# Patient Record
Sex: Male | Born: 1970 | Race: White | Hispanic: No | Smoking: Current every day smoker
Health system: Southern US, Community
[De-identification: ages and names within clinical notes are randomized; demographics above are authoritative.]

## PROBLEM LIST (undated history)

## (undated) DIAGNOSIS — K219 Gastro-esophageal reflux disease without esophagitis: Secondary | ICD-10-CM

## (undated) DIAGNOSIS — J449 Chronic obstructive pulmonary disease, unspecified: Secondary | ICD-10-CM

## (undated) DIAGNOSIS — I219 Acute myocardial infarction, unspecified: Secondary | ICD-10-CM

## (undated) DIAGNOSIS — F1010 Alcohol abuse, uncomplicated: Secondary | ICD-10-CM

## (undated) DIAGNOSIS — J45909 Unspecified asthma, uncomplicated: Secondary | ICD-10-CM

## (undated) DIAGNOSIS — R51 Headache: Secondary | ICD-10-CM

## (undated) DIAGNOSIS — F419 Anxiety disorder, unspecified: Secondary | ICD-10-CM

## (undated) DIAGNOSIS — E785 Hyperlipidemia, unspecified: Secondary | ICD-10-CM

## (undated) DIAGNOSIS — I10 Essential (primary) hypertension: Secondary | ICD-10-CM

## (undated) DIAGNOSIS — C4490 Unspecified malignant neoplasm of skin, unspecified: Secondary | ICD-10-CM

## (undated) DIAGNOSIS — I25810 Atherosclerosis of coronary artery bypass graft(s) without angina pectoris: Secondary | ICD-10-CM

## (undated) DIAGNOSIS — R519 Headache, unspecified: Secondary | ICD-10-CM

## (undated) DIAGNOSIS — Z720 Tobacco use: Secondary | ICD-10-CM

## (undated) HISTORY — PX: SKIN CANCER EXCISION: SHX779

## (undated) HISTORY — DX: Anxiety disorder, unspecified: F41.9

## (undated) HISTORY — DX: Unspecified asthma, uncomplicated: J45.909

## (undated) HISTORY — PX: LACERATION REPAIR: SHX5168

## (undated) HISTORY — DX: Essential (primary) hypertension: I10

## (undated) HISTORY — DX: Acute myocardial infarction, unspecified: I21.9

---

## 2005-01-29 ENCOUNTER — Emergency Department (HOSPITAL_COMMUNITY): Admission: EM | Admit: 2005-01-29 | Discharge: 2005-01-30 | Payer: Self-pay | Admitting: Emergency Medicine

## 2005-02-17 ENCOUNTER — Emergency Department (HOSPITAL_COMMUNITY): Admission: EM | Admit: 2005-02-17 | Discharge: 2005-02-17 | Payer: Self-pay | Admitting: Emergency Medicine

## 2005-03-04 ENCOUNTER — Emergency Department (HOSPITAL_COMMUNITY): Admission: EM | Admit: 2005-03-04 | Discharge: 2005-03-04 | Payer: Self-pay | Admitting: Emergency Medicine

## 2005-11-10 ENCOUNTER — Emergency Department (HOSPITAL_COMMUNITY): Admission: EM | Admit: 2005-11-10 | Discharge: 2005-11-10 | Payer: Self-pay | Admitting: Emergency Medicine

## 2017-10-18 DIAGNOSIS — R51 Headache: Secondary | ICD-10-CM

## 2017-10-18 DIAGNOSIS — R0789 Other chest pain: Secondary | ICD-10-CM

## 2017-10-18 DIAGNOSIS — R740 Nonspecific elevation of levels of transaminase and lactic acid dehydrogenase [LDH]: Secondary | ICD-10-CM

## 2017-10-18 DIAGNOSIS — R079 Chest pain, unspecified: Secondary | ICD-10-CM | POA: Diagnosis not present

## 2017-10-18 DIAGNOSIS — R109 Unspecified abdominal pain: Secondary | ICD-10-CM

## 2017-10-18 DIAGNOSIS — J449 Chronic obstructive pulmonary disease, unspecified: Secondary | ICD-10-CM

## 2017-10-18 DIAGNOSIS — F1010 Alcohol abuse, uncomplicated: Secondary | ICD-10-CM

## 2017-10-18 DIAGNOSIS — D751 Secondary polycythemia: Secondary | ICD-10-CM

## 2017-10-18 DIAGNOSIS — R911 Solitary pulmonary nodule: Secondary | ICD-10-CM

## 2017-10-18 DIAGNOSIS — Z720 Tobacco use: Secondary | ICD-10-CM

## 2017-11-12 ENCOUNTER — Inpatient Hospital Stay (HOSPITAL_COMMUNITY): Payer: Medicaid Other

## 2017-11-12 ENCOUNTER — Other Ambulatory Visit: Payer: Self-pay

## 2017-11-12 ENCOUNTER — Inpatient Hospital Stay (HOSPITAL_COMMUNITY)
Admission: EM | Admit: 2017-11-12 | Discharge: 2017-11-13 | DRG: 282 | Discharge status: Against Medical Advice | Payer: Medicaid Other | Source: Other Acute Inpatient Hospital | Attending: Internal Medicine | Admitting: Internal Medicine

## 2017-11-12 ENCOUNTER — Encounter (HOSPITAL_COMMUNITY): Payer: Self-pay | Admitting: Internal Medicine

## 2017-11-12 DIAGNOSIS — I25110 Atherosclerotic heart disease of native coronary artery with unstable angina pectoris: Secondary | ICD-10-CM | POA: Diagnosis present

## 2017-11-12 DIAGNOSIS — Z7982 Long term (current) use of aspirin: Secondary | ICD-10-CM | POA: Diagnosis not present

## 2017-11-12 DIAGNOSIS — R079 Chest pain, unspecified: Secondary | ICD-10-CM | POA: Diagnosis present

## 2017-11-12 DIAGNOSIS — J410 Simple chronic bronchitis: Secondary | ICD-10-CM | POA: Diagnosis not present

## 2017-11-12 DIAGNOSIS — R451 Restlessness and agitation: Secondary | ICD-10-CM | POA: Diagnosis present

## 2017-11-12 DIAGNOSIS — F1020 Alcohol dependence, uncomplicated: Secondary | ICD-10-CM | POA: Diagnosis present

## 2017-11-12 DIAGNOSIS — K7010 Alcoholic hepatitis without ascites: Secondary | ICD-10-CM | POA: Diagnosis present

## 2017-11-12 DIAGNOSIS — Z85828 Personal history of other malignant neoplasm of skin: Secondary | ICD-10-CM

## 2017-11-12 DIAGNOSIS — Z79899 Other long term (current) drug therapy: Secondary | ICD-10-CM

## 2017-11-12 DIAGNOSIS — I214 Non-ST elevation (NSTEMI) myocardial infarction: Principal | ICD-10-CM

## 2017-11-12 DIAGNOSIS — I2510 Atherosclerotic heart disease of native coronary artery without angina pectoris: Secondary | ICD-10-CM

## 2017-11-12 DIAGNOSIS — Z8249 Family history of ischemic heart disease and other diseases of the circulatory system: Secondary | ICD-10-CM | POA: Diagnosis not present

## 2017-11-12 DIAGNOSIS — Z01810 Encounter for preprocedural cardiovascular examination: Secondary | ICD-10-CM | POA: Diagnosis not present

## 2017-11-12 DIAGNOSIS — R911 Solitary pulmonary nodule: Secondary | ICD-10-CM | POA: Diagnosis present

## 2017-11-12 DIAGNOSIS — J449 Chronic obstructive pulmonary disease, unspecified: Secondary | ICD-10-CM | POA: Diagnosis present

## 2017-11-12 DIAGNOSIS — I249 Acute ischemic heart disease, unspecified: Secondary | ICD-10-CM

## 2017-11-12 DIAGNOSIS — Z720 Tobacco use: Secondary | ICD-10-CM

## 2017-11-12 DIAGNOSIS — Z801 Family history of malignant neoplasm of trachea, bronchus and lung: Secondary | ICD-10-CM | POA: Diagnosis not present

## 2017-11-12 DIAGNOSIS — G8929 Other chronic pain: Secondary | ICD-10-CM | POA: Diagnosis present

## 2017-11-12 DIAGNOSIS — F17210 Nicotine dependence, cigarettes, uncomplicated: Secondary | ICD-10-CM | POA: Diagnosis present

## 2017-11-12 DIAGNOSIS — K219 Gastro-esophageal reflux disease without esophagitis: Secondary | ICD-10-CM | POA: Diagnosis present

## 2017-11-12 DIAGNOSIS — I5030 Unspecified diastolic (congestive) heart failure: Secondary | ICD-10-CM | POA: Diagnosis not present

## 2017-11-12 HISTORY — DX: Headache, unspecified: R51.9

## 2017-11-12 HISTORY — DX: Unspecified malignant neoplasm of skin, unspecified: C44.90

## 2017-11-12 HISTORY — DX: Headache: R51

## 2017-11-12 HISTORY — DX: Gastro-esophageal reflux disease without esophagitis: K21.9

## 2017-11-12 HISTORY — DX: Chronic obstructive pulmonary disease, unspecified: J44.9

## 2017-11-12 LAB — TROPONIN I
TROPONIN I: 0.23 ng/mL — AB (ref <0.03)
TROPONIN I: 0.13 ng/mL — AB (ref <0.03)

## 2017-11-12 MED ORDER — LORAZEPAM 2 MG/ML IJ SOLN: 0.0000 mg | Freq: Two times a day (BID) | INTRAMUSCULAR | Status: DC

## 2017-11-12 MED ORDER — NICOTINE 21 MG/24HR TD PT24
21.0000 mg | MEDICATED_PATCH | Freq: Every day | TRANSDERMAL | Status: DC
  Administered 2017-11-12 – 2017-11-13 (×2): 21 mg via TRANSDERMAL
  Filled 2017-11-12: qty 1

## 2017-11-12 MED ORDER — LORAZEPAM 2 MG/ML IJ SOLN
0.0000 mg | Freq: Four times a day (QID) | INTRAMUSCULAR | Status: DC
  Administered 2017-11-12 – 2017-11-13 (×4): 2 mg via INTRAVENOUS
  Filled 2017-11-12 (×3): qty 1
  Filled 2017-11-12: qty 2

## 2017-11-12 MED ORDER — LORAZEPAM 2 MG/ML IJ SOLN: 1.0000 mg | Freq: Four times a day (QID) | INTRAMUSCULAR | Status: DC | PRN

## 2017-11-12 MED ORDER — VITAMIN B-1 100 MG PO TABS
100.0000 mg | ORAL_TABLET | Freq: Every day | ORAL | Status: DC
  Administered 2017-11-12 – 2017-11-13 (×2): 100 mg via ORAL
  Filled 2017-11-12 (×2): qty 1

## 2017-11-12 MED ORDER — ATORVASTATIN CALCIUM 40 MG PO TABS
40.0000 mg | ORAL_TABLET | Freq: Every day | ORAL | Status: DC
  Administered 2017-11-12: 40 mg via ORAL

## 2017-11-12 MED ORDER — IPRATROPIUM BROMIDE 0.02 % IN SOLN
0.5000 mg | Freq: Four times a day (QID) | RESPIRATORY_TRACT | Status: DC
  Administered 2017-11-12 – 2017-11-13 (×4): 0.5 mg via RESPIRATORY_TRACT
  Filled 2017-11-12 (×4): qty 2.5

## 2017-11-12 MED ORDER — ALBUTEROL SULFATE (2.5 MG/3ML) 0.083% IN NEBU: 2.5000 mg | INHALATION_SOLUTION | RESPIRATORY_TRACT | Status: DC | PRN

## 2017-11-12 MED ORDER — BOOST / RESOURCE BREEZE PO LIQD CUSTOM: 1.0000 | Freq: Three times a day (TID) | ORAL | Status: DC

## 2017-11-12 MED ORDER — LORAZEPAM 1 MG PO TABS: 1.0000 mg | ORAL_TABLET | Freq: Four times a day (QID) | ORAL | Status: DC | PRN

## 2017-11-12 MED ORDER — ADULT MULTIVITAMIN W/MINERALS CH
1.0000 | ORAL_TABLET | Freq: Every day | ORAL | Status: DC
  Administered 2017-11-12 – 2017-11-13 (×2): 1 via ORAL
  Filled 2017-11-12: qty 1

## 2017-11-12 MED ORDER — NITROGLYCERIN 2 % TD OINT
0.5000 [in_us] | TOPICAL_OINTMENT | Freq: Four times a day (QID) | TRANSDERMAL | Status: DC
  Administered 2017-11-12 – 2017-11-13 (×4): 0.5 [in_us] via TOPICAL
  Filled 2017-11-12: qty 30

## 2017-11-12 MED ORDER — THIAMINE HCL 100 MG/ML IJ SOLN
100.0000 mg | Freq: Every day | INTRAMUSCULAR | Status: DC
  Administered 2017-11-12: 100 mg via INTRAVENOUS

## 2017-11-12 MED ORDER — IPRATROPIUM BROMIDE 0.02 % IN SOLN
RESPIRATORY_TRACT | Status: AC
  Administered 2017-11-12: 0.5 mg via RESPIRATORY_TRACT
  Filled 2017-11-12: qty 2.5

## 2017-11-12 MED ORDER — MORPHINE SULFATE (PF) 2 MG/ML IV SOLN
1.0000 mg | INTRAVENOUS | Status: DC | PRN
  Administered 2017-11-13: 2 mg via INTRAVENOUS
  Filled 2017-11-12: qty 1

## 2017-11-12 MED ORDER — ASPIRIN EC 81 MG PO TBEC
81.0000 mg | DELAYED_RELEASE_TABLET | Freq: Every day | ORAL | Status: DC
  Administered 2017-11-12: 81 mg via ORAL
  Filled 2017-11-12 (×2): qty 1

## 2017-11-12 MED ORDER — HEPARIN (PORCINE) IN NACL 100-0.45 UNIT/ML-% IJ SOLN: 1000.0000 [IU]/h | INTRAMUSCULAR | Status: DC

## 2017-11-12 MED ORDER — ALBUTEROL SULFATE (2.5 MG/3ML) 0.083% IN NEBU
2.5000 mg | INHALATION_SOLUTION | Freq: Four times a day (QID) | RESPIRATORY_TRACT | Status: DC
  Administered 2017-11-12 – 2017-11-13 (×4): 2.5 mg via RESPIRATORY_TRACT
  Filled 2017-11-12 (×4): qty 3

## 2017-11-12 MED ORDER — PANTOPRAZOLE SODIUM 40 MG PO TBEC
40.0000 mg | DELAYED_RELEASE_TABLET | Freq: Every day | ORAL | Status: DC
  Administered 2017-11-12 – 2017-11-13 (×2): 40 mg via ORAL
  Filled 2017-11-12 (×2): qty 1

## 2017-11-12 MED ORDER — ALBUTEROL SULFATE (2.5 MG/3ML) 0.083% IN NEBU
INHALATION_SOLUTION | RESPIRATORY_TRACT | Status: AC
  Administered 2017-11-12: 2.5 mg via RESPIRATORY_TRACT
  Filled 2017-11-12: qty 3

## 2017-11-12 MED ORDER — FOLIC ACID 1 MG PO TABS
1.0000 mg | ORAL_TABLET | Freq: Every day | ORAL | Status: DC
  Administered 2017-11-12 – 2017-11-13 (×2): 1 mg via ORAL
  Filled 2017-11-12: qty 1

## 2017-11-12 NOTE — H&P (Signed)
[facility]                                                                                    Patient Demographics  Greg [name], is a 47 y.o. male  CSN: [id number]  MRN: [id number]  DOB - [birth year]  Admit Date - 11/12/2017  Outpatient Primary MD for the patient is Patient, No Pcp Per   With History of -  in for   Chest pain  HPI  Greg [name]  is a 47 y.o. male, with past medical history significant for alcoholism and smoking who was transferred from [facility] emergency room after he presented for recurrent chest pain episodes last one was yesterday.  Last episode was described at retrosternal chest discomfort radiating to the back associated with shortness of breath, nausea, cold sweats and vomiting.  His troponins were found to be elevated with no significant EKG changes.  Patient was seen in the emergency room 1 month ago and was started on Protonix and nebulizer treatment for possible COPD.  Patient denies any history of palpitations or pedal edema.  His chest pain level was 10+/10 on arrival and now down to 7/10.  Patient was started on heparin drip and Nitropaste in his room and was transferred to our hospital for further management    Review of Systems    In addition to the HPI above,  No Fever-chills, No Headache, No changes with Vision or hearing, No problems swallowing food or Liquids, No Abdominal pain, No Nausea or Vommitting, Bowel movements are regular, No Blood in stool or Urine, No dysuria, No new skin rashes or bruises, No new joints pains-aches,  No new weakness, tingling, numbness in any extremity, No recent weight gain or loss, No polyuria, polydypsia or polyphagia, No significant Mental Stressors.  A full 10 point Review of Systems was done, except as stated above, all other Review of Systems were negative.   Social History Has a history of smoking and alcoholism   Family History Coronary artery disease in mother and lung  cancer in father  Prior to Admission medications   Not on File    Allergies not on file  Physical Exam  Vitals  Blood pressure (!) 135/99, pulse 83, resp. rate 20, SpO2 97 %.   1. General extremely pleasant gentleman, slightly anxious  2. Normal affect and insight, Not Suicidal or Homicidal, Awake Alert, Oriented X 3.  3. No F.N deficits, grossly, patient moving all extremities.  4. Ears and Eyes appear Normal, Conjunctivae clear, PERRLA. Moist Oral Mucosa.  5. Supple Neck, No JVD, No cervical lymphadenopathy appriciated, No Carotid Bruits.  6. Symmetrical Chest wall movement, scattered rhonchi and wheezing  7. RRR, No Gallops, Rubs or Murmurs, No Parasternal Heave.  8. Positive Bowel Sounds, Abdomen Soft, Non tender, No organomegaly appriciated,No rebound -guarding or rigidity.  9.  No Cyanosis, Normal Skin Turgor, No Skin Rash or Bruise.  10. Good muscle tone,  joints appear normal , no effusions, Normal ROM.    Data Review  CBC  White blood cell count 10, hemoglobin 16.9, platelets 329   Chemistries  Sodium 134, potassium 4.2 BUN 6 creatinine 0.7 blood sugar  133 AST 104/ALT 81/alk phos 110/bilirubin total 1.0  Troponin I 0.28/0.4   My personal review of EKG: Rhythm NSR, Rate 86 bpm, prolonged QT    Assessment & Plan  1.  NSTEMI , patient has significant family history of heart disease     Continue with heparin drip/Nitropaste     Echocardiogram ordered     Cardiology consult cold     Continue with aspirin    2.  Alcoholism with alcoholic hepatitis     Thiamine/folate     Observe for withdrawal/patient is not actively withdrawing at this time  3.  Tobacco abuse with early COPD      Advised about stopping      Nebulizer treatments  4.  Alcoholic hepatitis/transaminitis      Follow LFTs in a.m.  5.  History of chronic right lung nodule      Monitor   DVT Prophylaxis Heparin   AM Labs Ordered, also please review Full Orders  Family  Communication: Admission, patients condition and plan of care including tests being ordered have been discussed with the patient and wife who indicate understanding and agree with the plan and Code Status.  Code Status full  Disposition Plan: Home  Time spent in minutes : 44 minutes  Condition critical   _0 @

## 2017-11-12 NOTE — Consult Note (Signed)
.  Cardiology Admission History and Physical:   Patient ID: Greg [name] MRN: [id number]; DOB: 01-Mar-1970   Admission date: 11/12/2017  Primary Care Provider: Patient, No Pcp Per Primary Cardiologist: New   Asked to see by Dr Laren Everts for CP    History of Present Illness:   Greg [name] is a 47 yo with hx of tobacco abuse, EtOH abuse  About 1 month ago pt went to [facility] ER with CP  Describes as some stabbing as well as a pressure  Assoc with SOB   Given GI cocktail  At that visit   Pt says it Improved some/    Rx caltrate and protonix and inhaler   Got protonix   Inhaler    Since then had intermittent chest discomfort   +/- activity  More SOB with it     Yesterday th patient was  in class   Started having chest discomfort Not heavy  Stayed for 2 hour class   Got home  Pain persistent  Not real bad but when he got home it became more intense   Had not had like that before   He laid down  Seemed like it hurt worse   Got to [place] [facility]  Given meds   Pain eased   Now just slight (at 6 PM) [facility]roponin at [facility] was 0.28 the 0.4     PMH Negative  PSH   NEgative  FHx signif for CAD in Mother, father (early), Paternal and maternal grandparents      Medications Prior to Admission: Prior to Admission medications   Not on File     Allergies:   No Known Allergies  Social History:   Social History   Socioeconomic History  . Marital status: Married    Spouse name: Not on file  . Number of children: Not on file  . Years of education: Not on file  . Highest education level: Not on file  Occupational History  . Not on file  Social Needs  . Financial resource strain: Not on file  . Food insecurity:    Worry: Not on file    Inability: Not on file  . Transportation needs:    Medical: Not on file    Non-medical: Not on file  Tobacco Use  . Smoking status: Current Every Day Smoker    Packs/day: 2.00    Types: Cigarettes  Substance and Sexual Activity  .  Alcohol use: Yes    Alcohol/week: 12.0 standard drinks    Types: 12 Cans of beer per week  . Drug use: Not Currently  . Sexual activity: Yes  Lifestyle  . Physical activity:    Days per week: Not on file    Minutes per session: Not on file  . Stress: Not on file  Relationships  . Social connections:    Talks on phone: Not on file    Gets together: Not on file    Attends religious service: Not on file    Active member of club or organization: Not on file    Attends meetings of clubs or organizations: Not on file    Relationship status: Not on file  . Intimate partner violence:    Fear of current or ex partner: Not on file    Emotionally abused: Not on file    Physically abused: Not on file    Forced sexual activity: Not on file  Other Topics Concern  . Not on file  Social History  Narrative  . Not on file    Family History:  As noted above  The patient's family history includes CAD in his maternal grandfather, maternal grandmother, mother, paternal grandfather, and paternal grandmother.    ROS:  Please see the history of present illness.  All other ROS reviewed and negative.     Physical Exam/Data:   Vitals:   11/12/17 1437 11/12/17 1648 11/12/17 1800  BP: (!) 135/99    Pulse: 83 93   Resp: 20 (!) 9   SpO2: 97% 99%   Weight:   65.8 kg  Height:   5' 10"  (1.778 m)    Intake/Output Summary (Last 24 hours) at 11/12/2017 1837 Last data filed at 11/12/2017 1500 Gross per 24 hour  Intake 120 ml  Output -  Net 120 ml   Filed Weights   11/12/17 1800  Weight: 65.8 kg   Body mass index is 20.81 kg/m.  General:  Thin 47 yo male , in no acute distress HEENT: normal Lymph: no adenopathy Neck: no JVD Endocrine:  No thryomegaly Vascular: No carotid bruits; FA pulses 2+ bilaterally without bruits  Cardiac:  normal S1, S2; RRR; no murmur Lungs:  clear to auscultation bilaterally, no wheezing, rhonchi or rales  Abd: soft, Mild RUQ tenderness  No masses Ext: no  edema Musculoskeletal:  No deformities, BUE and BLE strength normal and equal Skin: warm and dry  Tatoos Neuro:  CNs 2-12 intact, no focal abnormalities noted Psych:  Normal affect    EKG:  The ECG that was done 10/9  was personally reviewed and demonstrates SR 77 bpm  T wave inversion V1, V2  Biphasic V3/V4  Relevant CV Studies:   Laboratory Data:  ChemistryNo results for input(s): NA, K, CL, CO2, GLUCOSE, BUN, CREATININE, CALCIUM, GFRNONAA, GFRAA, ANIONGAP in the last 168 hours.  No results for input(s): PROT, ALBUMIN, AST, ALT, ALKPHOS, BILITOT in the last 168 hours. HematologyNo results for input(s): WBC, RBC, HGB, HCT, MCV, MCH, MCHC, RDW, PLT in the last 168 hours. Cardiac Enzymes Recent Labs  Lab 11/12/17 1639  TROPONINI 0.23*   No results for input(s): TROPIPOC in the last 168 hours.  BNPNo results for input(s): BNP, PROBNP in the last 168 hours.  DDimer No results for input(s): DDIMER in the last 168 hours.  Radiology/Studies:  No results found.  Assessment and Plan:   1  NSTEMI  Pt's symptoms concerning   EKG with ANterior T wave changes  Concerning for possible LAD distribution. Pt currently with minimal discomfort Would recomm continuing heparin and NTG  Add MSO4 to regimn May have 3 V CAD   WOuld not use antiplatelet agent beyond ASA  Recomm L heart cath tomorrow ith possible PTCA/STent  Discussed risk/benefit   Pt understands and agrees to proceed  Will cancel echo  2  TObacco abuse   COunselled on cessation  PT willing to try with Chantix    Has Nicotine Patch now   Pt had a CXR at [facility] he had a nodule   Needs to be repeated.   3  EtOH abuse  Significant   Is not interested in quitting  Will put prn orders for withdrawal  4  HCM   High dose statin  Check in AM   LFTs pending      For questions or updates, please contact [facility] Please consult [email or web address] for contact info under        Signed, Dorris Carnes, MD  11/12/2017 6:37  PM

## 2017-11-12 NOTE — H&P (View-Only) (Signed)
.  Cardiology Admission History and Physical:   Patient ID: Greg [name] MRN: [id number]; DOB: Dec 08, 1970   Admission date: 11/12/2017  Primary Care Provider: Patient, No Pcp Per Primary Cardiologist: New   Asked to see by Dr Laren Everts for CP    History of Present Illness:   Greg [name] is a 47 yo with hx of tobacco abuse, EtOH abuse  About 1 month ago pt went to [facility] ER with CP  Describes as some stabbing as well as a pressure  Assoc with SOB   Given GI cocktail  At that visit   Pt says it Improved some/    Rx caltrate and protonix and inhaler   Got protonix   Inhaler    Since then had intermittent chest discomfort   +/- activity  More SOB with it     Yesterday th patient was  in class   Started having chest discomfort Not heavy  Stayed for 2 hour class   Got home  Pain persistent  Not real bad but when he got home it became more intense   Had not had like that before   He laid down  Seemed like it hurt worse   Got to [place] [facility] at [facility]  Given meds   Pain eased   Now just slight (at 6 PM) Troponin at [facility] was 0.28 the 0.4     PMH Negative  PSH   NEgative  FHx signif for CAD in Mother, father (early), Paternal and maternal grandparents      Medications Prior to Admission: Prior to Admission medications   Not on File     Allergies:   No Known Allergies  Social History:   Social History   Socioeconomic History  . Marital status: Married    Spouse name: Not on file  . Number of children: Not on file  . Years of education: Not on file  . Highest education level: Not on file  Occupational History  . Not on file  Social Needs  . Financial resource strain: Not on file  . Food insecurity:    Worry: Not on file    Inability: Not on file  . Transportation needs:    Medical: Not on file    Non-medical: Not on file  Tobacco Use  . Smoking status: Current Every Day Smoker    Packs/day: 2.00    Types: Cigarettes  Substance and Sexual Activity  .  Alcohol use: Yes    Alcohol/week: 12.0 standard drinks    Types: 12 Cans of beer per week  . Drug use: Not Currently  . Sexual activity: Yes  Lifestyle  . Physical activity:    Days per week: Not on file    Minutes per session: Not on file  . Stress: Not on file  Relationships  . Social connections:    Talks on phone: Not on file    Gets together: Not on file    Attends religious service: Not on file    Active member of club or organization: Not on file    Attends meetings of clubs or organizations: Not on file    Relationship status: Not on file  . Intimate partner violence:    Fear of current or ex partner: Not on file    Emotionally abused: Not on file    Physically abused: Not on file    Forced sexual activity: Not on file  Other Topics Concern  . Not on file  Social History  Narrative  . Not on file    Family History:  As noted above  The patient's family history includes CAD in his maternal grandfather, maternal grandmother, mother, paternal grandfather, and paternal grandmother.    ROS:  Please see the history of present illness.  All other ROS reviewed and negative.     Physical Exam/Data:   Vitals:   11/12/17 1437 11/12/17 1648 11/12/17 1800  BP: (!) 135/99    Pulse: 83 93   Resp: 20 (!) 9   SpO2: 97% 99%   Weight:   65.8 kg  Height:   5' 10"  (1.778 m)    Intake/Output Summary (Last 24 hours) at 11/12/2017 1837 Last data filed at 11/12/2017 1500 Gross per 24 hour  Intake 120 ml  Output -  Net 120 ml   Filed Weights   11/12/17 1800  Weight: 65.8 kg   Body mass index is 20.81 kg/m.  General:  Thin 47 yo male , in no acute distress HEENT: normal Lymph: no adenopathy Neck: no JVD Endocrine:  No thryomegaly Vascular: No carotid bruits; FA pulses 2+ bilaterally without bruits  Cardiac:  normal S1, S2; RRR; no murmur Lungs:  clear to auscultation bilaterally, no wheezing, rhonchi or rales  Abd: soft, Mild RUQ tenderness  No masses Ext: no  edema Musculoskeletal:  No deformities, BUE and BLE strength normal and equal Skin: warm and dry  Tatoos Neuro:  CNs 2-12 intact, no focal abnormalities noted Psych:  Normal affect    EKG:  The ECG that was done 10/9  was personally reviewed and demonstrates SR 77 bpm  T wave inversion V1, V2  Biphasic V3/V4  Relevant CV Studies:   Laboratory Data:  ChemistryNo results for input(s): NA, K, CL, CO2, GLUCOSE, BUN, CREATININE, CALCIUM, GFRNONAA, GFRAA, ANIONGAP in the last 168 hours.  No results for input(s): PROT, ALBUMIN, AST, ALT, ALKPHOS, BILITOT in the last 168 hours. HematologyNo results for input(s): WBC, RBC, HGB, HCT, MCV, MCH, MCHC, RDW, PLT in the last 168 hours. Cardiac Enzymes Recent Labs  Lab 11/12/17 1639  TROPONINI 0.23*   No results for input(s): TROPIPOC in the last 168 hours.  BNPNo results for input(s): BNP, PROBNP in the last 168 hours.  DDimer No results for input(s): DDIMER in the last 168 hours.  Radiology/Studies:  No results found.  Assessment and Plan:   1  NSTEMI  Pt's symptoms concerning   EKG with ANterior T wave changes  Concerning for possible LAD distribution. Pt currently with minimal discomfort Would recomm continuing heparin and NTG  Add MSO4 to regimn May have 3 V CAD   WOuld not use antiplatelet agent beyond ASA  Recomm L heart cath tomorrow ith possible PTCA/STent  Discussed risk/benefit   Pt understands and agrees to proceed  Will cancel echo  2  TObacco abuse   COunselled on cessation  PT willing to try with Chantix    Has Nicotine Patch now   Pt had a CXR at [facility] he had a nodule   Needs to be repeated.   3  EtOH abuse  Significant   Is not interested in quitting  Will put prn orders for withdrawal  4  HCM   High dose statin  Check in AM   LFTs pending      For questions or updates, please contact [facility] Please consult [email or web address] for contact info under        Signed, Dorris Carnes, MD  11/12/2017 6:37  PM

## 2017-11-12 NOTE — Progress Notes (Signed)
ANTICOAGULATION CONSULT NOTE - Initial Consult  Pharmacy Consult for heparin Indication: chest pain/ACS  Allergies not on file  Patient Measurements:   Heparin Dosing Weight:   Vital Signs: BP: 135/99 (10/09 1437) Pulse Rate: 93 (10/09 1648)  Labs: No results for input(s): HGB, HCT, PLT, APTT, LABPROT, INR, HEPARINUNFRC, HEPRLOWMOCWT, CREATININE, CKTOTAL, CKMB, TROPONINI in the last 72 hours.  CrCl cannot be calculated (No successful lab value found.).   Medical History: No past medical history on file.  Assessment: 47 year old male transferred from [facility] on heparin drip for r/o ACS.   Bmet and CBC from OSH were within normal limits. Heparin started ~0500 this am, initial aptt was within goal limits at 73s on 900 units/hr. Will continue at this rate and check confirmatory heparin level in am.   Goal of Therapy:  Heparin level 0.3-0.7 units/ml Monitor platelets by anticoagulation protocol: Yes   Plan:  Continue heparin infusion at 900 units/hr Check anti-Xa level daily while on heparin Continue to monitor H&H and platelets  Erin Hearing PharmD., BCPS Clinical Pharmacist 11/12/2017 5:27 PM

## 2017-11-13 ENCOUNTER — Encounter (HOSPITAL_COMMUNITY): Payer: Self-pay | Admitting: Cardiovascular Disease

## 2017-11-13 ENCOUNTER — Inpatient Hospital Stay (HOSPITAL_COMMUNITY): Payer: Medicaid Other

## 2017-11-13 ENCOUNTER — Telehealth: Payer: Self-pay | Admitting: Internal Medicine

## 2017-11-13 ENCOUNTER — Other Ambulatory Visit: Payer: Self-pay | Admitting: *Deleted

## 2017-11-13 ENCOUNTER — Encounter (HOSPITAL_COMMUNITY)
Admission: EM | Discharge status: Against Medical Advice | Payer: Self-pay | Source: Other Acute Inpatient Hospital | Attending: Internal Medicine

## 2017-11-13 DIAGNOSIS — I2510 Atherosclerotic heart disease of native coronary artery without angina pectoris: Secondary | ICD-10-CM

## 2017-11-13 DIAGNOSIS — I214 Non-ST elevation (NSTEMI) myocardial infarction: Secondary | ICD-10-CM

## 2017-11-13 DIAGNOSIS — I25110 Atherosclerotic heart disease of native coronary artery with unstable angina pectoris: Secondary | ICD-10-CM

## 2017-11-13 DIAGNOSIS — I5030 Unspecified diastolic (congestive) heart failure: Secondary | ICD-10-CM

## 2017-11-13 DIAGNOSIS — Z01810 Encounter for preprocedural cardiovascular examination: Secondary | ICD-10-CM

## 2017-11-13 HISTORY — PX: LEFT HEART CATH AND CORONARY ANGIOGRAPHY: CATH118249

## 2017-11-13 LAB — PULMONARY FUNCTION TEST
FEF 25-75 Pre: 0.71 L/sec
FEF2575-%Pred-Pre: 20 %
FEV1-%Pred-Pre: 33 %
FEV1-Pre: 1.33 L
FEV1FVC-%Pred-Pre: 77 %
FEV6-%Pred-Pre: 43 %
FEV6-Pre: 2.13 L
FEV6FVC-%Pred-Pre: 100 %
FVC-%Pred-Pre: 43 %
FVC-Pre: 2.2 L
Pre FEV1/FVC ratio: 61 %
Pre FEV6/FVC Ratio: 97 %

## 2017-11-13 LAB — COMPREHENSIVE METABOLIC PANEL
ALBUMIN: 2.8 g/dL — AB (ref 3.5–5.0)
ALT: 45 U/L — ABNORMAL HIGH (ref 0–44)
AST: 51 U/L — AB (ref 15–41)
Alkaline Phosphatase: 67 U/L (ref 38–126)
Anion gap: 10 (ref 5–15)
BUN: <5 mg/dL — ABNORMAL LOW (ref 6–20)
CHLORIDE: 102 mmol/L (ref 98–111)
CO2: 25 mmol/L (ref 22–32)
Calcium: 8.6 mg/dL — ABNORMAL LOW (ref 8.9–10.3)
Creatinine, Ser: 0.89 mg/dL (ref 0.61–1.24)
GFR calc Af Amer: >60 mL/min (ref >60)
GFR calc non Af Amer: >60 mL/min (ref >60)
GLUCOSE: 111 mg/dL — AB (ref 70–99)
POTASSIUM: 3.5 mmol/L (ref 3.5–5.1)
Sodium: 137 mmol/L (ref 135–145)
Total Bilirubin: 1.1 mg/dL (ref 0.3–1.2)
Total Protein: 5.8 g/dL — ABNORMAL LOW (ref 6.5–8.1)

## 2017-11-13 LAB — PROTIME-INR
INR: 0.98
Prothrombin Time: 12.9 seconds (ref 11.4–15.2)

## 2017-11-13 LAB — CBC
HEMATOCRIT: 44.3 % (ref 39.0–52.0)
Hemoglobin: 15 g/dL (ref 13.0–17.0)
MCH: 34 pg (ref 26.0–34.0)
MCHC: 33.9 g/dL (ref 30.0–36.0)
MCV: 100.5 fL — AB (ref 80.0–100.0)
NRBC: 0 % (ref 0.0–0.2)
PLATELETS: 272 10*3/uL (ref 150–400)
RBC: 4.41 MIL/uL (ref 4.22–5.81)
RDW: 12.8 % (ref 11.5–15.5)
WBC: 7.5 10*3/uL (ref 4.0–10.5)

## 2017-11-13 LAB — TROPONIN I: TROPONIN I: 0.13 ng/mL — AB (ref <0.03)

## 2017-11-13 LAB — HIV ANTIBODY (ROUTINE TESTING W REFLEX): HIV Screen 4th Generation wRfx: NONREACTIVE

## 2017-11-13 LAB — HEPARIN LEVEL (UNFRACTIONATED): Heparin Unfractionated: 0.25 IU/mL — ABNORMAL LOW (ref 0.30–0.70)

## 2017-11-13 LAB — FOLATE RBC
FOLATE, RBC: 1033 ng/mL (ref >498)
Folate, Hemolysate: 453.7 ng/mL
Hematocrit: 43.9 % (ref 37.5–51.0)

## 2017-11-13 SURGERY — LEFT HEART CATH AND CORONARY ANGIOGRAPHY
Anesthesia: LOCAL

## 2017-11-13 MED ORDER — HEPARIN SODIUM (PORCINE) 1000 UNIT/ML IJ SOLN
INTRAMUSCULAR | Status: AC
  Filled 2017-11-13: qty 1

## 2017-11-13 MED ORDER — SODIUM CHLORIDE 0.9 % IV SOLN
INTRAVENOUS | Status: DC
  Administered 2017-11-13: 04:00:00 via INTRAVENOUS

## 2017-11-13 MED ORDER — ASPIRIN 81 MG PO CHEW: 81.0000 mg | CHEWABLE_TABLET | Freq: Every day | ORAL | Status: DC

## 2017-11-13 MED ORDER — SODIUM CHLORIDE 0.9% FLUSH: 3.0000 mL | INTRAVENOUS | Status: DC | PRN

## 2017-11-13 MED ORDER — FENTANYL CITRATE (PF) 100 MCG/2ML IJ SOLN
INTRAMUSCULAR | Status: DC | PRN
  Administered 2017-11-13 (×2): 25 ug via INTRAVENOUS

## 2017-11-13 MED ORDER — ASPIRIN 81 MG PO CHEW: 81.0000 mg | CHEWABLE_TABLET | ORAL | Status: DC

## 2017-11-13 MED ORDER — ASPIRIN 81 MG PO CHEW
81.0000 mg | CHEWABLE_TABLET | ORAL | Status: AC
  Administered 2017-11-13: 81 mg via ORAL
  Filled 2017-11-13: qty 1

## 2017-11-13 MED ORDER — SODIUM CHLORIDE 0.9% FLUSH: 3.0000 mL | Freq: Two times a day (BID) | INTRAVENOUS | Status: DC

## 2017-11-13 MED ORDER — ONDANSETRON HCL 4 MG/2ML IJ SOLN: 4.0000 mg | Freq: Four times a day (QID) | INTRAMUSCULAR | Status: DC | PRN

## 2017-11-13 MED ORDER — SODIUM CHLORIDE 0.9 % IV SOLN: 250.0000 mL | INTRAVENOUS | Status: DC | PRN

## 2017-11-13 MED ORDER — SODIUM CHLORIDE 0.9 % IV SOLN: INTRAVENOUS | Status: DC

## 2017-11-13 MED ORDER — HEPARIN (PORCINE) IN NACL 1000-0.9 UT/500ML-% IV SOLN
INTRAVENOUS | Status: AC
  Filled 2017-11-13: qty 1000

## 2017-11-13 MED ORDER — HEPARIN SODIUM (PORCINE) 1000 UNIT/ML IJ SOLN
INTRAMUSCULAR | Status: DC | PRN
  Administered 2017-11-13: 3500 [IU] via INTRAVENOUS

## 2017-11-13 MED ORDER — VERAPAMIL HCL 2.5 MG/ML IV SOLN
INTRAVENOUS | Status: AC
  Filled 2017-11-13: qty 2

## 2017-11-13 MED ORDER — LIDOCAINE HCL (PF) 1 % IJ SOLN
INTRAMUSCULAR | Status: AC
  Filled 2017-11-13: qty 30

## 2017-11-13 MED ORDER — HEPARIN (PORCINE) IN NACL 1000-0.9 UT/500ML-% IV SOLN
INTRAVENOUS | Status: DC | PRN
  Administered 2017-11-13 (×2): 500 mL

## 2017-11-13 MED ORDER — VERAPAMIL HCL 2.5 MG/ML IV SOLN
INTRAVENOUS | Status: DC | PRN
  Administered 2017-11-13: 10 mL via INTRA_ARTERIAL

## 2017-11-13 MED ORDER — LIDOCAINE HCL (PF) 1 % IJ SOLN
INTRAMUSCULAR | Status: DC | PRN
  Administered 2017-11-13: 2 mL via INTRADERMAL

## 2017-11-13 MED ORDER — MIDAZOLAM HCL 2 MG/2ML IJ SOLN
INTRAMUSCULAR | Status: AC
  Filled 2017-11-13: qty 2

## 2017-11-13 MED ORDER — HEPARIN (PORCINE) IN NACL 100-0.45 UNIT/ML-% IJ SOLN: 1000.0000 [IU]/h | INTRAMUSCULAR | Status: DC

## 2017-11-13 MED ORDER — PERFLUTREN LIPID MICROSPHERE
1.0000 mL | INTRAVENOUS | Status: AC | PRN
  Administered 2017-11-13: 3 mL via INTRAVENOUS
  Filled 2017-11-13: qty 10

## 2017-11-13 MED ORDER — ACETAMINOPHEN 325 MG PO TABS: 650.0000 mg | ORAL_TABLET | ORAL | Status: DC | PRN

## 2017-11-13 MED ORDER — ATORVASTATIN CALCIUM 80 MG PO TABS: 80.0000 mg | ORAL_TABLET | Freq: Every day | ORAL | Status: DC

## 2017-11-13 MED ORDER — DIAZEPAM 5 MG PO TABS: 5.0000 mg | ORAL_TABLET | Freq: Four times a day (QID) | ORAL | Status: DC | PRN

## 2017-11-13 MED ORDER — MIDAZOLAM HCL 2 MG/2ML IJ SOLN
INTRAMUSCULAR | Status: DC | PRN
  Administered 2017-11-13: 2 mg via INTRAVENOUS
  Administered 2017-11-13: 1 mg via INTRAVENOUS

## 2017-11-13 MED ORDER — FENTANYL CITRATE (PF) 100 MCG/2ML IJ SOLN
INTRAMUSCULAR | Status: AC
  Filled 2017-11-13: qty 2

## 2017-11-13 MED ORDER — IOHEXOL 350 MG/ML SOLN
INTRAVENOUS | Status: DC | PRN
  Administered 2017-11-13: 65 mL via INTRA_ARTERIAL

## 2017-11-13 SURGICAL SUPPLY — 12 items
CATH INFINITI 5FR ANG PIGTAIL (CATHETERS) ×1 IMPLANT
CATH OPTITORQUE TIG 4.0 5F (CATHETERS) ×1 IMPLANT
DEVICE RAD COMP TR BAND LRG (VASCULAR PRODUCTS) ×1 IMPLANT
GLIDESHEATH SLEND SS 6F .021 (SHEATH) ×1 IMPLANT
GUIDEWIRE INQWIRE 1.5J.035X260 (WIRE) IMPLANT
INQWIRE 1.5J .035X260CM (WIRE) ×2
KIT HEART LEFT (KITS) ×2 IMPLANT
PACK CARDIAC CATHETERIZATION (CUSTOM PROCEDURE TRAY) ×2 IMPLANT
SHEATH PROBE COVER 6X72 (BAG) ×1 IMPLANT
SYR MEDRAD MARK V 150ML (SYRINGE) ×2 IMPLANT
TRANSDUCER W/STOPCOCK (MISCELLANEOUS) ×2 IMPLANT
TUBING CIL FLEX 10 FLL-RA (TUBING) ×2 IMPLANT

## 2017-11-13 NOTE — Progress Notes (Signed)
ANTICOAGULATION CONSULT NOTE - Follow Up Consult  Pharmacy Consult for heparin Indication: NSTEMI  Labs: Recent Labs    11/12/17 1639 11/12/17 2218 11/13/17 0416  HGB  --   --  15.0  HCT  --   --  44.3  PLT  --   --  272  LABPROT  --   --  12.9  INR  --   --  0.98  HEPARINUNFRC  --   --  0.25*  CREATININE  --   --  0.89  TROPONINI 0.23* 0.13* 0.13*    Assessment: 47yo male subtherapeutic on heparin after one PTT at goal at OSH; no gtt issues or signs of bleeding per RN.  Goal of Therapy:  Heparin level 0.3-0.7 units/ml   Plan:  Will increase heparin gtt by 1-2 units/kg/hr to 1000 units/hr and check level in 6 hours.    Wynona Neat, PharmD, BCPS  11/13/2017,5:57 AM

## 2017-11-13 NOTE — Progress Notes (Signed)
[place] for heparin Indication: chest pain/ACS  No Known Allergies  Patient Measurements: Height: 5' 10"  (177.8 cm) Weight: 145 lb (65.8 kg) IBW/kg (Calculated) : 73 Heparin Dosing Weight:   Vital Signs: BP: 136/91 (10/10 0820) Pulse Rate: 0 (10/10 0830)  Labs: Recent Labs    11/12/17 1639 11/12/17 2218 11/13/17 0416  HGB  --   --  15.0  HCT  --   --  44.3  PLT  --   --  272  LABPROT  --   --  12.9  INR  --   --  0.98  HEPARINUNFRC  --   --  0.25*  CREATININE  --   --  0.89  TROPONINI 0.23* 0.13* 0.13*    Estimated Creatinine Clearance: 93.4 mL/min (by C-G formula based on SCr of 0.89 mg/dL).   Medical History: Past Medical History:  Diagnosis Date  . COPD (chronic obstructive pulmonary disease) ([place])   . Daily headache   . GERD (gastroesophageal reflux disease)   . Skin cancer    "cut off my eyelid and my neck" (11/12/2017)    Assessment: 47 year old male transferred from [facility] on heparin drip for r/o ACS. 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 Sheath removed at 0827 this AM. Heparin level was slightly low and rate was increased this AM - will resume at previous rate. CBC wnl. No bleed issues documented.  Goal of Therapy:  Heparin level 0.3-0.7 units/ml Monitor platelets by anticoagulation protocol: Yes   Plan:  Resume heparin infusion at 1000 units/hr at 1830 (10hrs post-sheath removal) 6h heparin level Monitor daily heparin level and CBC, s/sx bleeding F/u plans for CABG  Elicia Lamp, PharmD, BCPS Clinical Pharmacist Clinical phone [phone] Please check AMION for all [place] contact numbers 11/13/2017 8:59 AM

## 2017-11-13 NOTE — Telephone Encounter (Signed)
Spoke to wife Told her pt made a mistake leaving   Has severe CAD   Could

## 2017-11-13 NOTE — Progress Notes (Signed)
Pre-op Cardiac Surgery  Carotid Findings:  Right ICA 1-39% stenosis. Bilateral vertebral arteries patent with antegrade flow.   Upper Extremity Right Left  Brachial Pressures 127 127  Radial Waveforms Triphasic Biphasic  Ulnar Waveforms Biphasic Biphasic  Palmar Arch (Allen's Test) See below See below   Findings:   Right Upper Extremity: Doppler waveforms decrease 50% with right radial compression. Doppler waveform obliterate with right ulnar compression.  Left Upper Extremity: Doppler waveform obliterate with left radial compression. Doppler waveforms remain within normal limits with left ulnar compression.   Lower  Extremity Right Left  Dorsalis Pedis 91 117  Posterior Tibial 74 118  Ankle/Brachial Indices 0.72 0.93   Findings:   Right ABI: Resting right ankle-brachial index indicates moderate right lower extremity arterial disease.  Left ABI: Resting left ankle-brachial index indicates mild left lower extremity arterial disease.  Hongying Attikus Bartoszek (RDMS RVT) 11/13/17 2:07 PM

## 2017-11-13 NOTE — Progress Notes (Signed)
Pt communicated with this RN and Dr. Herbert Moors that he was leaving AMA. He states the he wants to leave for "2 days and then come back". RN and provider educated patient on seriousness of the results of his heart cath and the importance of waiting to speak to CVTS. RN was able to convince pt to stay long enough to remove TR band safely. Will continue to monitor patient and provide comfort and reassurance.

## 2017-11-13 NOTE — Interval H&P Note (Signed)
Cath Lab Visit (complete for each Cath Lab visit)  Clinical Evaluation Leading to the Procedure:   ACS: Yes.    Non-ACS:    Anginal Classification: CCS IV  Anti-ischemic medical therapy: Minimal Therapy (1 class of medications)  Non-Invasive Test Results: No non-invasive testing performed  Prior CABG: No previous CABG      History and Physical Interval Note:  11/13/2017 7:45 AM  Greg [name]  has presented today for surgery, with the diagnosis of NSTEMI  The various methods of treatment have been discussed with the patient and family. After consideration of risks, benefits and other options for treatment, the patient has consented to  Procedure(s): LEFT HEART CATH AND CORONARY ANGIOGRAPHY (N/A) as a surgical intervention .  The patient's history has been reviewed, patient examined, no change in status, stable for surgery.  I have reviewed the patient's chart and labs.  Questions were answered to the patient's satisfaction.     Shelva Majestic

## 2017-11-13 NOTE — Progress Notes (Signed)
Called to pt room by pt mother. Pt had removed telemetry and was trying to remove his IV. I was able to get patient to allow me to safely remove his IV. This RN educated pt and family again about the seriousness of the cath results, however patient stated that he "did not care" and that he would "rather die at home than in a hospital". Unfortunately I was unable to convince him to stay any longer.

## 2017-11-13 NOTE — Progress Notes (Signed)
PROGRESS NOTE    Greg [name]  ZWC:[id number] DOB: [birth year] DOA: 11/12/2017 PCP: Patient, No Pcp Per   Brief Narrative:  47 year old with past medical history relevant for COPD, tobacco abuse, alcohol abuse transferred from [facility] emergency room with chest pain and found to have NSTEMI status post cardiac catheterization on 11/13/2016 that showed severe three-vessel disease pending evaluation by cardio thoracic surgery.   Assessment & Plan:   Active Problems:   ACS (acute coronary syndrome) (HCC)   #) NSTEMI due to severe CAD: Cardiac catheterization showed severe three-vessel disease amenable to CABG -Continue aspirin 81 mg -Continue heparin drip -Continue atorvastatin 80 mg -Unclear why patient is not on beta-blocker - Cardiology consulted, appreciate recommendations -Pending evaluation by cardio thoracic surgery  #) COPD: Recently diagnosed -Smoking cessation encouraged -Continue PRN albuterol  #) Alcohol abuse: -Counseling provided -CIWA protocol -Continue thiamine and folate supplementation  Fluids: Tolerating p.o. Elect lites: Monitor and supplement Nutrition: Heart healthy diet   Prophylaxis: Heparin drip  Disposition: Pending transfer to cardio thoracic surgery  Full code  Consultants:   Cardiology  Cardio thoracic surgery  Procedures:   Echo 11/12/2016: Pending Cardiac catheterization 11/13/2017:Severe multivessel coronary obstructive disease with diffuse 50% proximal LAD stenoses followed by 95% stenosis before the takeoff of the first diagonal vessel with 80% stenosis beyond the diagonal vessel in the mid LAD; 75 and 70% proximal left circumflex stenoses; and diffuse irregularity of the proximal and mid RCA with a narrowings up to 50% with focal 90% acute margin stenosis in a dominant RCA.  Distal vessels are all very large.  Preserved global LV contractility with an ejection fraction of 55 to 60% but with a small region of mid inferior focal  hypocontractility.  LVEDP is 15 mmHg.  RECOMMENDATION: In this patient with severe diffuse multivessel coronary obstructive disease and excellent distal targets, recommend surgical consultation for consideration of CABG revascularization.  Initiate high potency statin therapy with target LDL less than 80.  Smoking cessation is imperative.   Recommend Aspirin 52m daily for moderate CAD.  Will not start DAPT in anticipation of CABG revascularization surgery.  Will resume heparin 10 hours post procedure.  Antimicrobials:  None   Subjective: Patient reports being quite anxious about his cardiac catheterization results which show severe three-vessel disease that is amenable to likely surgical intervention.  He currently denies any nausea, vomiting, chest pain, cough, congestion, rhinorrhea.  He reports that [place] about [place] he is afraid of dying on the table.  Objective: Vitals:   11/13/17 0830 11/13/17 0859 11/13/17 0957 11/13/17 1141  BP:   118/86 131/89  Pulse: (!) 0     Resp:  14    SpO2: (!) 0% 94%    Weight:      Height:        Intake/Output Summary (Last 24 hours) at 11/13/2017 1327 Last data filed at 11/13/2017 0500 Gross per 24 hour  Intake 523 ml  Output 1025 ml  Net -502 ml   Filed Weights   11/12/17 1800  Weight: 65.8 kg    Examination:  General exam: Appears calm and comfortable  Respiratory system: Clear to auscultation. Respiratory effort normal. Cardiovascular system: Regular rate and rhythm, no murmurs Gastrointestinal system: Abdomen is nondistended, soft and nontender. No organomegaly or masses felt. Normal bowel sounds heard. Central nervous system: Grossly intact, moving all extremities Extremities: Trace lower extremity edema Skin: No rashes over visible skin Psychiatry: Judgement and insight appear impaired l. Mood &  affect appropriate.     Data Reviewed: I have personally reviewed following labs and  imaging studies  CBC: Recent Labs  Lab 11/13/17 0416  WBC 7.5  HGB 15.0  HCT 44.3  MCV 100.5*  PLT 778   Basic Metabolic Panel: Recent Labs  Lab 11/13/17 0416  NA 137  K 3.5  CL 102  CO2 25  GLUCOSE 111*  BUN <5*  CREATININE 0.89  CALCIUM 8.6*   GFR: Estimated Creatinine Clearance: 93.4 mL/min (by C-G formula based on SCr of 0.89 mg/dL). Liver Function Tests: Recent Labs  Lab 11/13/17 0416  AST 51*  ALT 45*  ALKPHOS 67  BILITOT 1.1  PROT 5.8*  ALBUMIN 2.8*   No results for input(s): LIPASE, AMYLASE in the last 168 hours. No results for input(s): AMMONIA in the last 168 hours. Coagulation Profile: Recent Labs  Lab 11/13/17 0416  INR 0.98   Cardiac Enzymes: Recent Labs  Lab 11/12/17 1639 11/12/17 2218 11/13/17 0416  TROPONINI 0.23* 0.13* 0.13*   BNP (last 3 results) No results for input(s): PROBNP in the last 8760 hours. HbA1C: No results for input(s): HGBA1C in the last 72 hours. CBG: No results for input(s): GLUCAP in the last 168 hours. Lipid Profile: No results for input(s): CHOL, HDL, LDLCALC, TRIG, CHOLHDL, LDLDIRECT in the last 72 hours. Thyroid Function Tests: No results for input(s): TSH, T4TOTAL, FREET4, T3FREE, THYROIDAB in the last 72 hours. Anemia Panel: No results for input(s): VITAMINB12, FOLATE, FERRITIN, TIBC, IRON, RETICCTPCT in the last 72 hours. Sepsis Labs: No results for input(s): PROCALCITON, LATICACIDVEN in the last 168 hours.  No results found for this or any previous visit (from the past 240 hour(s)).       Radiology Studies: Dg Chest 2 View  Result Date: 11/12/2017 CLINICAL DATA:  Shortness of breath and chest pain EXAM: CHEST - 2 VIEW COMPARISON:  CT of the chest from earlier in the same day. FINDINGS: The heart size and mediastinal contours are within normal limits. Both lungs are hyperinflated. The visualized skeletal structures are unremarkable. IMPRESSION: COPD without acute abnormality. Electronically Signed    By: Inez Catalina M.D.   On: 11/12/2017 21:49        Scheduled Meds: . albuterol  2.5 mg Nebulization Q6H  . aspirin EC  81 mg Oral Daily  . atorvastatin  80 mg Oral q1800  . feeding supplement  1 Container Oral TID BM  . folic acid  1 mg Oral Daily  . ipratropium  0.5 mg Nebulization Q6H  . LORazepam  0-4 mg Intravenous Q6H   Followed by  . [START ON 11/14/2017] LORazepam  0-4 mg Intravenous Q12H  . multivitamin with minerals  1 tablet Oral Daily  . nicotine  21 mg Transdermal Daily  . nitroGLYCERIN  0.5 inch Topical Q6H  . pantoprazole  40 mg Oral Daily  . sodium chloride flush  3 mL Intravenous Q12H  . thiamine  100 mg Intravenous Daily  . thiamine  100 mg Oral Daily   Continuous Infusions: . sodium chloride 125 mL/hr at 11/13/17 0852  . sodium chloride    . heparin       LOS: 1 day    Time spent: [place], MD [facility]  If 7PM-7AM, please contact night-coverage [email or web address] Password [email or web address] 11/13/2017, 1:27 PM

## 2017-11-13 NOTE — Telephone Encounter (Signed)
Called pt    SPoke to wife   Reviewed cath findings of severe 3 V CAD.  Critical narrowing of LAD REcomm he return to hospital   Risk of further angina, arrhythmia and death   Needs to be revascularized.  Patient says he needs understands risk  Wife says patient wants to talk to son outside of hospital Also mad he cannot drink in hospital   Told him possible to have 1 beer.per day to avoid withdrawal    No tobacco allowed  Pt says he will come back to hospital tomorrow.

## 2017-11-13 NOTE — Consult Note (Addendum)
[address]       [place] [address]             [phone]        [place] Record #[id number] Date of Birth: 26-Jul-1970  Referring: Dr. Claiborne Billings Primary Care: Patient, No Pcp Per Primary Cardiologist:No primary care provider on file.  Chief Complaint:   Chest pain/ shortness of breath  History of Present Illness:      [name] is a 47 year old male patient with a past medical history significant for COPD, GERD, alcoholism, and current everyday smoker who was transferred from [facility] emergency room after he presented with recurrent chest pain.  He originally went to the [facility] emergency room September 16 and was discharged home with a diagnosis of lung nodule seen on chest x-ray and unstable angina.  He returned to the [facility] emergency room his last episode of chest pain was on 11/11/2017 with prolonged episode of chest pain, was discharged home and then called back several hours later and told to come immediately back to the emergency room.  The pain is described as chest discomfort radiating to the back associated with shortness of breath, nausea, cold sweats and vomiting.  His initial troponin was slightly elevated at 0.23.  There were no significant EKG changes.  After returning to the emergency room when called he was ED he was started on heparin drip and Nitropaste.  He was then transferred to [facility] for further care.  He ultimately underwent cardiac catheterization on 11/13/2017 at which showed severe multivessel coronary obstructive disease.  Including high-grade LAD disease .  He had preserved LV contractility with an ejection fraction of 55 to 60%.   Cardiac surgery consulted for consideration of surgical revascularization.  The patient has been debilitated and severely nutritionally depleted because of chronic abdominal pain and high levels of alcohol intake, patient admits to 12 pack of beer a day.  During our  discussion the patient was very anxious to go home.  He became agitated and wanted to rip his IV out and leave .   Current Activity/ Functional Status: Patient was independent with mobility/ambulation, transfers, ADL's, IADL's.   Zubrod Score: At the time of surgery this patient's most appropriate activity status/level should be described as: []     0    Normal activity, no symptoms [x]     1    Restricted in physical strenuous activity but ambulatory, able to do out light work []     2    Ambulatory and capable of self care, unable to do work activities, up and about                 more than 50%  Of the time                            []     3    Only limited self care, in bed greater than 50% of waking hours []     4    Completely disabled, no self care, confined to bed or chair []     5    Moribund  Past Medical History:  Diagnosis Date  . COPD (chronic obstructive pulmonary disease) ([place])   . Daily headache   . GERD (gastroesophageal reflux disease)   . Skin cancer    "cut off my eyelid and my neck" (11/12/2017)    Past Surgical History:  Procedure Laterality Date  . LACERATION REPAIR     "had staples put in my head" (11/12/2017)  . SKIN CANCER EXCISION     "cut off my eyelid and my neck" (11/12/2017)    Social History   Tobacco Use  Smoking Status Current Every Day Smoker  . Packs/day: 2.50  . Years: 35.00  . Pack years: 87.50  . Types: Cigarettes  Smokeless Tobacco Never Used    Social History   Substance and Sexual Activity  Alcohol Use Yes  . Alcohol/week: 84.0 standard drinks  . Types: 84 Cans of beer per week   Comment: 11/12/2017 "12 cans of beer/day"     No Known Allergies  Current Facility-Administered Medications  Medication Dose Route Frequency Provider Last Rate Last Dose  . 0.9 %  sodium chloride infusion   Intravenous Continuous Troy Sine, MD 125 mL/hr at 11/13/17 [phone]    . 0.9 %  sodium chloride infusion  250 mL Intravenous PRN Troy Sine,  MD      . acetaminophen (TYLENOL) tablet 650 mg  650 mg Oral Q4H PRN Troy Sine, MD      . albuterol (PROVENTIL) (2.5 MG/3ML) 0.083% nebulizer solution 2.5 mg  2.5 mg Nebulization Q6H Merton Border, MD   2.5 mg at 11/13/17 0859  . albuterol (PROVENTIL) (2.5 MG/3ML) 0.083% nebulizer solution 2.5 mg  2.5 mg Nebulization Q2H PRN Merton Border, MD      . aspirin EC tablet 81 mg  81 mg Oral Daily Merton Border, MD   81 mg at 11/12/17 1743  . atorvastatin (LIPITOR) tablet 80 mg  80 mg Oral q1800 Troy Sine, MD      . feeding supplement (BOOST / RESOURCE BREEZE) liquid 1 Container  1 Container Oral TID BM Merton Border, MD      . folic acid (FOLVITE) tablet 1 mg  1 mg Oral Daily Fay Records, MD   1 mg at 11/13/17 1000  . heparin ADULT infusion 100 units/mL (25000 units/231m sodium chloride 0.45%)  1,000 Units/hr Intravenous Continuous BRomona Curls RPH      . ipratropium (ATROVENT) nebulizer solution 0.5 mg  0.5 mg Nebulization Q6H HMerton Border MD   0.5 mg at 11/13/17 0859  . LORazepam (ATIVAN) injection 0-4 mg  0-4 mg Intravenous Q6H RFay Records MD   2 mg at 11/13/17 0947   Followed by  . [START ON 11/14/2017] LORazepam (ATIVAN) injection 0-4 mg  0-4 mg Intravenous Q12H RFay Records MD      . LORazepam (ATIVAN) tablet 1 mg  1 mg Oral Q6H PRN RFay Records MD       Or  . LORazepam (ATIVAN) injection 1 mg  1 mg Intravenous Q6H PRN RDorris CarnesV, MD      . morphine 2 MG/ML injection 1-2 mg  1-2 mg Intravenous Q2H PRN RFay Records MD   2 mg at 11/13/17 1141  . multivitamin with minerals tablet 1 tablet  1 tablet Oral Daily RFay Records MD   1 tablet at 11/13/17 0947  . nicotine (NICODERM CQ - dosed in mg/24 hours) patch 21 mg  21 mg Transdermal Daily HMerton Border MD   21 mg at 11/13/17 1000  . nitroGLYCERIN (NITROGLYN) 2 % ointment 0.5 inch  0.5 inch Topical Q6H HMerton Border MD   0.5 inch at 11/13/17 1142  . ondansetron (ZOFRAN) injection 4 mg  4 mg Intravenous Q6H PRN KTroy Sine MD       .  pantoprazole (PROTONIX) EC tablet 40 mg  40 mg Oral Daily Merton Border, MD   40 mg at 11/13/17 0947  . sodium chloride flush (NS) 0.9 % injection 3 mL  3 mL Intravenous Q12H Shelva Majestic A, MD      . sodium chloride flush (NS) 0.9 % injection 3 mL  3 mL Intravenous PRN Troy Sine, MD      . thiamine (B-1) injection 100 mg  100 mg Intravenous Daily Fay Records, MD   100 mg at 11/12/17 2000  . thiamine (VITAMIN B-1) tablet 100 mg  100 mg Oral Daily Merton Border, MD   100 mg at 11/13/17 1000    Medications Prior to Admission  Medication Sig Dispense Refill Last Dose  . albuterol (PROVENTIL HFA;VENTOLIN HFA) 108 (90 Base) MCG/ACT inhaler Inhale 2 puffs into the lungs every 6 (six) hours as needed for wheezing or shortness of breath.   Past Week at Unknown time  . aspirin EC 81 MG tablet Take 81 mg by mouth daily.   Past Week at Unknown time    Family History  Problem Relation Age of Onset  . CAD Mother   . CAD Maternal Grandmother   . CAD Maternal Grandfather   . CAD Paternal Grandmother   . CAD Paternal Grandfather      Review of Systems:   Review of Systems  Constitutional: Positive for diaphoresis. Negative for chills and fever.  Respiratory: Positive for shortness of breath. Negative for cough, sputum production and wheezing.   Cardiovascular: Positive for chest pain.  Gastrointestinal: Positive for heartburn, nausea and vomiting.  Psychiatric/Behavioral: Positive for substance abuse.   Pertinent items are noted in HPI.      Physical Exam: BP 131/89   Pulse (!) 0   Resp 14   Ht 5' 10"  (1.778 m)   Wt 65.8 kg   SpO2 94%   BMI 20.81 kg/m    General appearance: alert, cooperative and no distress Resp: clear to auscultation bilaterally Cardio: regular rate and rhythm, S1, S2 normal, no murmur, click, rub or gallop GI: soft, non-tender; bowel sounds normal; no masses,  no organomegaly Extremities: extremities normal, atraumatic, no cyanosis or edema, good  distal perfusion.  Neurologic: Grossly normal, patient is very tremulous during exam  Diagnostic Studies & Laboratory data:   Dist RCA lesion is 90% stenosed.  Prox RCA lesion is 30% stenosed.  Prox RCA to Mid RCA lesion is 50% stenosed.  Mid RCA lesion is 50% stenosed.  Prox Cx lesion is 75% stenosed.  Mid Cx lesion is 70% stenosed.  Ost LAD lesion is 50% stenosed.  Prox LAD-1 lesion is 50% stenosed.  Prox LAD-2 lesion is 95% stenosed.  Mid LAD lesion is 80% stenosed.  LV end diastolic pressure is normal.  The left ventricular systolic function is normal.   Severe multivessel coronary obstructive disease with diffuse 50% proximal LAD stenoses followed by 95% stenosis before the takeoff of the first diagonal vessel with 80% stenosis beyond the diagonal vessel in the mid LAD; 75 and 70% proximal left circumflex stenoses; and diffuse irregularity of the proximal and mid RCA with a narrowings up to 50% with focal 90% acute margin stenosis in a dominant RCA.  Distal vessels are all very large.  Preserved global LV contractility with an ejection fraction of 55 to 60% but with a small region of mid inferior focal hypocontractility.  LVEDP is 15 mmHg.  RECOMMENDATION: In this patient with severe diffuse multivessel coronary obstructive  disease and excellent distal targets, recommend surgical consultation for consideration of CABG revascularization.  Initiate high potency statin therapy with target LDL less than 80.  Smoking cessation is imperative.  Recommend Aspirin 7m daily for moderate CAD.  Will not start DAPT in anticipation of CABG revascularization surgery.  Will resume heparin 10 hours post procedure.     Recent Radiology Findings:    Dg Chest 2 View  Result Date: 11/12/2017 CLINICAL DATA:  Shortness of breath and chest pain EXAM: CHEST - 2 VIEW COMPARISON:  CT of the chest from earlier in the same day. FINDINGS: The heart size and mediastinal contours are within  normal limits. Both lungs are hyperinflated. The visualized skeletal structures are unremarkable. IMPRESSION: COPD without acute abnormality. Electronically Signed   By: MInez CatalinaM.D.   On: 11/12/2017 21:49   [facility] no lung nodule noted, no dissection, pancreas was of normal size and shape  I have independently reviewed the above radiologic studies and discussed with the patient   Recent Lab Findings: Lab Results  Component Value Date   WBC 7.5 11/13/2017   HGB 15.0 11/13/2017   HCT 44.3 11/13/2017   PLT 272 11/13/2017   GLUCOSE 111 (H) 11/13/2017   ALT 45 (H) 11/13/2017   AST 51 (H) 11/13/2017   NA 137 11/13/2017   K 3.5 11/13/2017   CL 102 11/13/2017   CREATININE 0.89 11/13/2017   BUN <5 (L) 11/13/2017   CO2 25 11/13/2017   INR 0.98 11/13/2017      Assessment / Plan:      1. NSTEMI-currently with no chest pain.  He has had chest pain while in the hospital.  He is on heparin gtt and nitroglycerin ointment.  He also has as needed morphine.  Cardiac catheterization listed above.  Multivessel severe coronary artery disease.  Plan for CABG on Monday or Tuesday with Dr. GServando Snare 2.  GERD-continue Protonix 3.  Tobacco abuse-currently on a nicotine patch.  Counseled on cessation. 4.  Lung nodule-reported by patient based [facility]  Recent chest x-ray here only shows COPD without acute abnormality. 5.  Alcohol abuse-started on withdrawal protocol. 6.  The patient intent on leaving against our advice    Patient appears to have high risk of possible acute alcohol withdrawal, he notes this occurred in the past.  If the patient comes on stable and develops DTs, cardiology will need to consider salvage LAD angioplasty to prevent occlusion of his LAD and large anterior infarct.  Plan: To the OR on Monday/Tuesday if the patient is agreeable and neurologically intact.. The procedure has been discussed in detail  with the patient and family.  I stressed to him that would not be in his interest to leave the hospital in his current condition.   EGrace IsaacMD      [address] [place] [id number] [phone]  BHaakon

## 2017-11-14 ENCOUNTER — Emergency Department (HOSPITAL_COMMUNITY): Payer: Medicaid Other

## 2017-11-14 ENCOUNTER — Other Ambulatory Visit: Payer: Self-pay

## 2017-11-14 ENCOUNTER — Inpatient Hospital Stay (HOSPITAL_COMMUNITY)
Admission: EM | Admit: 2017-11-14 | Discharge: 2017-11-26 | DRG: 234 | Disposition: A | Discharge status: Home / Self Care | Payer: Medicaid Other | Attending: Cardiothoracic Surgery | Admitting: Cardiothoracic Surgery

## 2017-11-14 ENCOUNTER — Encounter (HOSPITAL_COMMUNITY): Payer: Self-pay

## 2017-11-14 DIAGNOSIS — Z79899 Other long term (current) drug therapy: Secondary | ICD-10-CM

## 2017-11-14 DIAGNOSIS — K760 Fatty (change of) liver, not elsewhere classified: Secondary | ICD-10-CM | POA: Diagnosis present

## 2017-11-14 DIAGNOSIS — J9811 Atelectasis: Secondary | ICD-10-CM | POA: Diagnosis not present

## 2017-11-14 DIAGNOSIS — I25110 Atherosclerotic heart disease of native coronary artery with unstable angina pectoris: Secondary | ICD-10-CM | POA: Diagnosis present

## 2017-11-14 DIAGNOSIS — F17210 Nicotine dependence, cigarettes, uncomplicated: Secondary | ICD-10-CM | POA: Diagnosis present

## 2017-11-14 DIAGNOSIS — Z23 Encounter for immunization: Secondary | ICD-10-CM

## 2017-11-14 DIAGNOSIS — R109 Unspecified abdominal pain: Secondary | ICD-10-CM | POA: Diagnosis present

## 2017-11-14 DIAGNOSIS — R188 Other ascites: Secondary | ICD-10-CM

## 2017-11-14 DIAGNOSIS — I2510 Atherosclerotic heart disease of native coronary artery without angina pectoris: Secondary | ICD-10-CM

## 2017-11-14 DIAGNOSIS — I214 Non-ST elevation (NSTEMI) myocardial infarction: Principal | ICD-10-CM | POA: Diagnosis present

## 2017-11-14 DIAGNOSIS — F10239 Alcohol dependence with withdrawal, unspecified: Secondary | ICD-10-CM | POA: Diagnosis not present

## 2017-11-14 DIAGNOSIS — R7989 Other specified abnormal findings of blood chemistry: Secondary | ICD-10-CM

## 2017-11-14 DIAGNOSIS — D62 Acute posthemorrhagic anemia: Secondary | ICD-10-CM | POA: Diagnosis not present

## 2017-11-14 DIAGNOSIS — J449 Chronic obstructive pulmonary disease, unspecified: Secondary | ICD-10-CM

## 2017-11-14 DIAGNOSIS — Z7982 Long term (current) use of aspirin: Secondary | ICD-10-CM

## 2017-11-14 DIAGNOSIS — R945 Abnormal results of liver function studies: Secondary | ICD-10-CM

## 2017-11-14 DIAGNOSIS — Z951 Presence of aortocoronary bypass graft: Secondary | ICD-10-CM

## 2017-11-14 DIAGNOSIS — R000 Tachycardia, unspecified: Secondary | ICD-10-CM | POA: Diagnosis not present

## 2017-11-14 DIAGNOSIS — F419 Anxiety disorder, unspecified: Secondary | ICD-10-CM | POA: Diagnosis not present

## 2017-11-14 DIAGNOSIS — E785 Hyperlipidemia, unspecified: Secondary | ICD-10-CM | POA: Diagnosis present

## 2017-11-14 DIAGNOSIS — G8929 Other chronic pain: Secondary | ICD-10-CM | POA: Diagnosis present

## 2017-11-14 DIAGNOSIS — Z419 Encounter for procedure for purposes other than remedying health state, unspecified: Secondary | ICD-10-CM

## 2017-11-14 DIAGNOSIS — R140 Abdominal distension (gaseous): Secondary | ICD-10-CM

## 2017-11-14 DIAGNOSIS — K219 Gastro-esophageal reflux disease without esophagitis: Secondary | ICD-10-CM | POA: Diagnosis present

## 2017-11-14 DIAGNOSIS — F1010 Alcohol abuse, uncomplicated: Secondary | ICD-10-CM | POA: Diagnosis present

## 2017-11-14 DIAGNOSIS — I959 Hypotension, unspecified: Secondary | ICD-10-CM | POA: Diagnosis not present

## 2017-11-14 DIAGNOSIS — Z9689 Presence of other specified functional implants: Secondary | ICD-10-CM

## 2017-11-14 DIAGNOSIS — I200 Unstable angina: Secondary | ICD-10-CM

## 2017-11-14 DIAGNOSIS — Z85828 Personal history of other malignant neoplasm of skin: Secondary | ICD-10-CM

## 2017-11-14 DIAGNOSIS — R5381 Other malaise: Secondary | ICD-10-CM | POA: Diagnosis present

## 2017-11-14 DIAGNOSIS — E876 Hypokalemia: Secondary | ICD-10-CM | POA: Diagnosis present

## 2017-11-14 HISTORY — DX: Chronic obstructive pulmonary disease, unspecified: J44.9

## 2017-11-14 HISTORY — DX: Atherosclerosis of coronary artery bypass graft(s) without angina pectoris: I25.810

## 2017-11-14 LAB — BASIC METABOLIC PANEL
Anion gap: 10 (ref 5–15)
BUN: 5 mg/dL — AB (ref 6–20)
CALCIUM: 9.3 mg/dL (ref 8.9–10.3)
CO2: 25 mmol/L (ref 22–32)
CREATININE: 1.01 mg/dL (ref 0.61–1.24)
Chloride: 101 mmol/L (ref 98–111)
GFR calc Af Amer: >60 mL/min (ref >60)
GLUCOSE: 72 mg/dL (ref 70–99)
Potassium: 3.5 mmol/L (ref 3.5–5.1)
Sodium: 136 mmol/L (ref 135–145)

## 2017-11-14 LAB — I-STAT TROPONIN, ED: Troponin i, poc: 0.04 ng/mL (ref 0.00–0.08)

## 2017-11-14 LAB — ECHOCARDIOGRAM COMPLETE
Height: 70 in
Weight: 2320 oz

## 2017-11-14 NOTE — ED Triage Notes (Signed)
Pt here with increased shortness of breath and central chest pain that goes to his back.  Hx of COPD.  Was recently admitted and left AMA yesterday. A&ox4 at this time.

## 2017-11-14 NOTE — Discharge Summary (Signed)
Physician Discharge Summary  Greg [name] XBM:[id number] DOB: Jun 24, 1970 DOA: 11/12/2017  PCP: Patient, No Pcp Per  Admit date: 11/12/2017 Discharge date: 11/14/2017  Admitted From: Home, [facility] Disposition:  Home, Pt left AMA  Recommendations for Outpatient Follow-up:  1. Follow up with PCP in 1-2 weeks 2. Please obtain BMP/CBC in one week 3. Please follow up a cardiologist as an outpatient as well as a cardiothoracic surgeon.  Home Health: No Equipment/Devices: No  Discharge Condition: stable CODE STATUS: FULL Diet recommendation: Heart Healthy   Brief/Interim Summary:  #) NSTEMI in the setting of severe CAD: Patient was transferred from any pain hospital after presenting with chest pain and found to have a low troponin.  Cardiac catheterization done on 11/13/2017 showed evidence of severe three-vessel disease that was amenable to CABG.  Patient was maintained on heparin drip, aspirin, atorvastatin.  Cardiothoracic surgery was consulted however patient left AMA despite multiple providers and nurses discussing with the patient the risk of sudden cardiac death due to severe coronary artery disease.  Patient reported that he was upset that he could not drink alcohol in the hospital.  He reported that he was willing to stay if he could drink alcohol.  Informed the patient that he would be maintained on CIWA protocol but it was not possible for him to receive alcohol in the hospital.  Patient then removed his IV and left AGAINST MEDICAL ADVICE.  #) COPD: Patient was recently diagnosed with COPD likely in the setting of tobacco abuse.  Patient was informed on smoking cessation.  #) Alcohol abuse: Patient was maintained on CIWA protocol and given thiamine and folate supplementation.  Despite extensive counseling patient reported that he would not stop drinking alcohol as he has been drinking since he was quite young.  He understood the risks with his cardiac history.  He left AMA as he  could not drink alcohol.  Discharge Diagnoses:  Active Problems:   ACS (acute coronary syndrome) [facility])    Discharge Instructions   Allergies as of 11/13/2017   No Known Allergies     Medication List    ASK your doctor about these medications   albuterol 108 (90 Base) MCG/ACT inhaler Commonly known as:  PROVENTIL HFA;VENTOLIN HFA Inhale 2 puffs into the lungs every 6 (six) hours as needed for wheezing or shortness of breath.   aspirin EC 81 MG tablet Take 81 mg by mouth daily.       No Known Allergies  Consultations:  Cardiology  Cardiothoracic surgery   Procedures/Studies: Dg Chest 2 View  Result Date: 11/12/2017 CLINICAL DATA:  Shortness of breath and chest pain EXAM: CHEST - 2 VIEW COMPARISON:  CT of the chest from earlier in the same day. FINDINGS: The heart size and mediastinal contours are within normal limits. Both lungs are hyperinflated. The visualized skeletal structures are unremarkable. IMPRESSION: COPD without acute abnormality. Electronically Signed   By: Inez Catalina M.D.   On: 11/12/2017 21:49    Echo 11/12/2016: - Left ventricle: The cavity size was normal. Wall thickness was   normal. Systolic function was normal. The estimated ejection   fraction was in the range of 60% to 65%. Wall motion was normal;   there were no regional wall motion abnormalities. Doppler   parameters are consistent with abnormal left ventricular   relaxation (grade 1 diastolic dysfunction). The E/e&' ratio is <8,   suggesting normal LV filling pressure. - Left atrium: The atrium was normal in size. - Inferior vena  cava: The vessel was normal in size. The   respirophasic diameter changes were in the normal range (>= 50%),   consistent with normal central venous pressure.  Impressions:  - LVEF 60-65%, normal wall thickness, normal wall motion, grade 1    DD, normal LV filling pressure, normal LA size, normal IVC. Cardiac catheterization 11/13/2017:Severe multivessel  coronary obstructive disease with diffuse 50% proximal LAD stenoses followed by 95% stenosis before the takeoff of the first diagonal vessel with 80% stenosis beyond the diagonal vessel in the mid LAD; 75 and 70% proximal left circumflex stenoses; and diffuse irregularity of the proximal and mid RCA with a narrowings up to 50% with focal 90% acute margin stenosis in a dominant RCA. Distal vessels are all very large.  Preserved global LV contractility with an ejection fraction of 55 to 60% but with a small region of mid inferior focal hypocontractility. LVEDP is 15 mmHg.  RECOMMENDATION: In this patient with severe diffuse multivessel coronary obstructive disease and excellent distal targets, recommend surgical consultation for consideration of CABG revascularization. Initiate high potency statin therapy with target LDL less than 80. Smoking cessation is imperative.   Recommend Aspirin 35m daily for moderate CAD.Will not start DAPT in anticipation of CABG revascularization surgery. Will resume heparin 10 hours post procedure.   11/13/2017 PFTs:  Ref Range & Units 1d ago  FVC-Pre L 2.20   FVC-%Pred-Pre % 43   FEV1-Pre L 1.33   FEV1-%Pred-Pre % 33   FEV6-Pre L 2.13   FEV6-%Pred-Pre % 43   Pre FEV1/FVC ratio % 61   FEV1FVC-%Pred-Pre % 77   Pre FEV6/FVC Ratio % 97   FEV6FVC-%Pred-Pre % 100   FEF 25-75 Pre L/sec 0.71   FEF2575-%Pred-Pre % 20       Subjective:   Discharge Exam: Vitals:   11/13/17 1500 11/13/17 1521  BP: (!) 136/101 (!) 136/101  Pulse:  (!) 108  Resp:    Temp:  98 F (36.7 C)  SpO2:  96%   Vitals:   11/13/17 0957 11/13/17 1141 11/13/17 1500 11/13/17 1521  BP: 118/86 131/89 (!) 136/101 (!) 136/101  Pulse:    (!) 108  Resp:      Temp:    98 F (36.7 C)  TempSrc:    Oral  SpO2:    96%  Weight:      Height:        Patient left AMA before physical exam could be performed   The results of significant diagnostics from this hospitalization  (including imaging, microbiology, ancillary and laboratory) are listed below for reference.     Microbiology: No results found for this or any previous visit (from the past 240 hour(s)).   Labs: BNP (last 3 results) No results for input(s): BNP in the last 8760 hours. Basic Metabolic Panel: Recent Labs  Lab 11/13/17 0416  NA 137  K 3.5  CL 102  CO2 25  GLUCOSE 111*  BUN <5*  CREATININE 0.89  CALCIUM 8.6*   Liver Function Tests: Recent Labs  Lab 11/13/17 0416  AST 51*  ALT 45*  ALKPHOS 67  BILITOT 1.1  PROT 5.8*  ALBUMIN 2.8*   No results for input(s): LIPASE, AMYLASE in the last 168 hours. No results for input(s): AMMONIA in the last 168 hours. CBC: Recent Labs  Lab 11/12/17 1639 11/13/17 0416  WBC  --  7.5  HGB  --  15.0  HCT 43.9 44.3  MCV  --  100.5*  PLT  --  272  Cardiac Enzymes: Recent Labs  Lab 11/12/17 1639 11/12/17 2218 11/13/17 0416  TROPONINI 0.23* 0.13* 0.13*   BNP: Invalid input(s): POCBNP CBG: No results for input(s): GLUCAP in the last 168 hours. D-Dimer No results for input(s): DDIMER in the last 72 hours. Hgb A1c No results for input(s): HGBA1C in the last 72 hours. Lipid Profile No results for input(s): CHOL, HDL, LDLCALC, TRIG, CHOLHDL, LDLDIRECT in the last 72 hours. Thyroid function studies No results for input(s): TSH, T4TOTAL, T3FREE, THYROIDAB in the last 72 hours.  Invalid input(s): FREET3 Anemia work up No results for input(s): VITAMINB12, FOLATE, FERRITIN, TIBC, IRON, RETICCTPCT in the last 72 hours. Urinalysis No results found for: COLORURINE, APPEARANCEUR, LABSPEC, [place], GLUCOSEU, HGBUR, BILIRUBINUR, KETONESUR, PROTEINUR, UROBILINOGEN, NITRITE, LEUKOCYTESUR Sepsis Labs Invalid input(s): PROCALCITONIN,  WBC,  LACTICIDVEN Microbiology No results found for this or any previous visit (from the past 240 hour(s)).   SIGNED:   Cristy Folks, MD  [facility] 11/14/2017, 11:04 AM  If 7PM-7AM, please  contact night-coverage [email or web address] Password [email or web address]

## 2017-11-15 DIAGNOSIS — I25110 Atherosclerotic heart disease of native coronary artery with unstable angina pectoris: Secondary | ICD-10-CM

## 2017-11-15 DIAGNOSIS — E7800 Pure hypercholesterolemia, unspecified: Secondary | ICD-10-CM | POA: Diagnosis not present

## 2017-11-15 DIAGNOSIS — I2510 Atherosclerotic heart disease of native coronary artery without angina pectoris: Secondary | ICD-10-CM | POA: Diagnosis not present

## 2017-11-15 DIAGNOSIS — I214 Non-ST elevation (NSTEMI) myocardial infarction: Secondary | ICD-10-CM | POA: Diagnosis present

## 2017-11-15 DIAGNOSIS — E7801 Familial hypercholesterolemia: Secondary | ICD-10-CM | POA: Diagnosis not present

## 2017-11-15 DIAGNOSIS — I25119 Atherosclerotic heart disease of native coronary artery with unspecified angina pectoris: Secondary | ICD-10-CM | POA: Diagnosis not present

## 2017-11-15 DIAGNOSIS — D62 Acute posthemorrhagic anemia: Secondary | ICD-10-CM | POA: Diagnosis not present

## 2017-11-15 DIAGNOSIS — F1010 Alcohol abuse, uncomplicated: Secondary | ICD-10-CM

## 2017-11-15 DIAGNOSIS — E785 Hyperlipidemia, unspecified: Secondary | ICD-10-CM | POA: Diagnosis present

## 2017-11-15 DIAGNOSIS — R109 Unspecified abdominal pain: Secondary | ICD-10-CM | POA: Diagnosis present

## 2017-11-15 DIAGNOSIS — G8929 Other chronic pain: Secondary | ICD-10-CM | POA: Diagnosis present

## 2017-11-15 DIAGNOSIS — K219 Gastro-esophageal reflux disease without esophagitis: Secondary | ICD-10-CM | POA: Diagnosis present

## 2017-11-15 DIAGNOSIS — I200 Unstable angina: Secondary | ICD-10-CM | POA: Diagnosis present

## 2017-11-15 DIAGNOSIS — Z720 Tobacco use: Secondary | ICD-10-CM | POA: Diagnosis not present

## 2017-11-15 DIAGNOSIS — I959 Hypotension, unspecified: Secondary | ICD-10-CM | POA: Diagnosis not present

## 2017-11-15 DIAGNOSIS — R5381 Other malaise: Secondary | ICD-10-CM | POA: Diagnosis present

## 2017-11-15 DIAGNOSIS — F419 Anxiety disorder, unspecified: Secondary | ICD-10-CM | POA: Diagnosis not present

## 2017-11-15 DIAGNOSIS — Z23 Encounter for immunization: Secondary | ICD-10-CM | POA: Diagnosis not present

## 2017-11-15 DIAGNOSIS — K760 Fatty (change of) liver, not elsewhere classified: Secondary | ICD-10-CM | POA: Diagnosis present

## 2017-11-15 DIAGNOSIS — F17210 Nicotine dependence, cigarettes, uncomplicated: Secondary | ICD-10-CM | POA: Diagnosis present

## 2017-11-15 DIAGNOSIS — E876 Hypokalemia: Secondary | ICD-10-CM | POA: Diagnosis present

## 2017-11-15 DIAGNOSIS — F10239 Alcohol dependence with withdrawal, unspecified: Secondary | ICD-10-CM | POA: Diagnosis not present

## 2017-11-15 DIAGNOSIS — J9811 Atelectasis: Secondary | ICD-10-CM | POA: Diagnosis not present

## 2017-11-15 DIAGNOSIS — Z85828 Personal history of other malignant neoplasm of skin: Secondary | ICD-10-CM | POA: Diagnosis not present

## 2017-11-15 DIAGNOSIS — Z7982 Long term (current) use of aspirin: Secondary | ICD-10-CM | POA: Diagnosis not present

## 2017-11-15 DIAGNOSIS — R000 Tachycardia, unspecified: Secondary | ICD-10-CM | POA: Diagnosis not present

## 2017-11-15 DIAGNOSIS — J449 Chronic obstructive pulmonary disease, unspecified: Secondary | ICD-10-CM | POA: Diagnosis present

## 2017-11-15 DIAGNOSIS — Z79899 Other long term (current) drug therapy: Secondary | ICD-10-CM | POA: Diagnosis not present

## 2017-11-15 LAB — LIPID PANEL
Cholesterol: 204 mg/dL — ABNORMAL HIGH (ref 0–200)
HDL: 75 mg/dL (ref >40)
LDL CALC: 114 mg/dL — AB (ref 0–99)
TRIGLYCERIDES: 76 mg/dL (ref <150)
Total CHOL/HDL Ratio: 2.7 RATIO
VLDL: 15 mg/dL (ref 0–40)

## 2017-11-15 LAB — HEPARIN LEVEL (UNFRACTIONATED)
HEPARIN UNFRACTIONATED: 0.44 [IU]/mL (ref 0.30–0.70)
Heparin Unfractionated: 0.25 IU/mL — ABNORMAL LOW (ref 0.30–0.70)

## 2017-11-15 LAB — TROPONIN I
TROPONIN I: 0.06 ng/mL — AB (ref <0.03)
Troponin I: 0.06 ng/mL (ref <0.03)
Troponin I: 0.05 ng/mL (ref <0.03)

## 2017-11-15 LAB — CBC
HCT: 53.3 % — ABNORMAL HIGH (ref 39.0–52.0)
Hemoglobin: 17.4 g/dL — ABNORMAL HIGH (ref 13.0–17.0)
MCH: 33.3 pg (ref 26.0–34.0)
MCHC: 32.6 g/dL (ref 30.0–36.0)
MCV: 102.1 fL — ABNORMAL HIGH (ref 80.0–100.0)
PLATELETS: 309 10*3/uL (ref 150–400)
RBC: 5.22 MIL/uL (ref 4.22–5.81)
RDW: 12.8 % (ref 11.5–15.5)
WBC: 10.1 10*3/uL (ref 4.0–10.5)
nRBC: 0 % (ref 0.0–0.2)

## 2017-11-15 LAB — CBC WITH DIFFERENTIAL/PLATELET
Abs Immature Granulocytes: 0.05 10*3/uL (ref 0.00–0.07)
Basophils Absolute: 0.1 10*3/uL (ref 0.0–0.1)
Basophils Relative: 1 %
EOS PCT: 1 %
Eosinophils Absolute: 0.1 10*3/uL (ref 0.0–0.5)
HEMATOCRIT: 47.8 % (ref 39.0–52.0)
HEMOGLOBIN: 15.8 g/dL (ref 13.0–17.0)
Immature Granulocytes: 1 %
LYMPHS ABS: 1.7 10*3/uL (ref 0.7–4.0)
LYMPHS PCT: 19 %
MCH: 33.8 pg (ref 26.0–34.0)
MCHC: 33.1 g/dL (ref 30.0–36.0)
MCV: 102.1 fL — ABNORMAL HIGH (ref 80.0–100.0)
Monocytes Absolute: 1.2 10*3/uL — ABNORMAL HIGH (ref 0.1–1.0)
Monocytes Relative: 13 %
NEUTROS ABS: 6.1 10*3/uL (ref 1.7–7.7)
NEUTROS PCT: 65 %
NRBC: 0 % (ref 0.0–0.2)
Platelets: 280 10*3/uL (ref 150–400)
RBC: 4.68 MIL/uL (ref 4.22–5.81)
RDW: 13 % (ref 11.5–15.5)
WBC: 9.2 10*3/uL (ref 4.0–10.5)

## 2017-11-15 LAB — BASIC METABOLIC PANEL
ANION GAP: 12 (ref 5–15)
BUN: 5 mg/dL — ABNORMAL LOW (ref 6–20)
CO2: 26 mmol/L (ref 22–32)
Calcium: 9 mg/dL (ref 8.9–10.3)
Chloride: 102 mmol/L (ref 98–111)
Creatinine, Ser: 1 mg/dL (ref 0.61–1.24)
GFR calc Af Amer: >60 mL/min (ref >60)
GLUCOSE: 99 mg/dL (ref 70–99)
Potassium: 3.2 mmol/L — ABNORMAL LOW (ref 3.5–5.1)
Sodium: 140 mmol/L (ref 135–145)

## 2017-11-15 LAB — ETHANOL: Alcohol, Ethyl (B): <10 mg/dL (ref <10)

## 2017-11-15 LAB — SURGICAL PCR SCREEN
MRSA, PCR: NEGATIVE
STAPHYLOCOCCUS AUREUS: NEGATIVE

## 2017-11-15 MED ORDER — LORAZEPAM 1 MG PO TABS
0.0000 mg | ORAL_TABLET | Freq: Four times a day (QID) | ORAL | Status: AC
  Administered 2017-11-15: 2 mg via ORAL
  Administered 2017-11-15 – 2017-11-16 (×3): 1 mg via ORAL
  Administered 2017-11-16: 2 mg via ORAL
  Filled 2017-11-15: qty 2
  Filled 2017-11-15: qty 1
  Filled 2017-11-15: qty 2
  Filled 2017-11-15: qty 1
  Filled 2017-11-15: qty 2

## 2017-11-15 MED ORDER — NICOTINE 21 MG/24HR TD PT24
21.0000 mg | MEDICATED_PATCH | Freq: Every day | TRANSDERMAL | Status: DC
  Administered 2017-11-15 – 2017-11-20 (×6): 21 mg via TRANSDERMAL
  Filled 2017-11-15 (×6): qty 1

## 2017-11-15 MED ORDER — ASPIRIN 81 MG PO CHEW
324.0000 mg | CHEWABLE_TABLET | Freq: Once | ORAL | Status: AC
  Administered 2017-11-15: 324 mg via ORAL
  Filled 2017-11-15: qty 4

## 2017-11-15 MED ORDER — VITAMIN B-1 100 MG PO TABS
100.0000 mg | ORAL_TABLET | Freq: Every day | ORAL | Status: DC
  Administered 2017-11-15 – 2017-11-26 (×10): 100 mg via ORAL
  Filled 2017-11-15 (×11): qty 1

## 2017-11-15 MED ORDER — LORAZEPAM 2 MG/ML IJ SOLN
0.0000 mg | Freq: Four times a day (QID) | INTRAMUSCULAR | Status: AC
  Administered 2017-11-15: 2 mg via INTRAVENOUS
  Filled 2017-11-15: qty 1

## 2017-11-15 MED ORDER — LORAZEPAM 2 MG/ML IJ SOLN: 0.0000 mg | Freq: Two times a day (BID) | INTRAMUSCULAR | Status: AC

## 2017-11-15 MED ORDER — NICOTINE 21 MG/24HR TD PT24: 21.0000 mg | MEDICATED_PATCH | Freq: Every day | TRANSDERMAL | Status: DC

## 2017-11-15 MED ORDER — NICOTINE 21 MG/24HR TD PT24
21.0000 mg | MEDICATED_PATCH | Freq: Once | TRANSDERMAL | Status: AC
  Administered 2017-11-15: 21 mg via TRANSDERMAL
  Filled 2017-11-15: qty 1

## 2017-11-15 MED ORDER — NITROGLYCERIN IN D5W 200-5 MCG/ML-% IV SOLN
0.0000 ug/min | INTRAVENOUS | Status: DC
  Administered 2017-11-15: 10 ug/min via INTRAVENOUS
  Administered 2017-11-16: 20 ug/min via INTRAVENOUS
  Administered 2017-11-20: 10 ug/min via INTRAVENOUS
  Filled 2017-11-15 (×4): qty 250

## 2017-11-15 MED ORDER — THIAMINE HCL 100 MG/ML IJ SOLN
100.0000 mg | Freq: Every day | INTRAMUSCULAR | Status: DC
  Administered 2017-11-22: 100 mg via INTRAVENOUS
  Filled 2017-11-15: qty 2

## 2017-11-15 MED ORDER — ONDANSETRON HCL 4 MG/2ML IJ SOLN: 4.0000 mg | Freq: Four times a day (QID) | INTRAMUSCULAR | Status: DC | PRN

## 2017-11-15 MED ORDER — LORAZEPAM 1 MG PO TABS
0.0000 mg | ORAL_TABLET | Freq: Two times a day (BID) | ORAL | Status: AC
  Administered 2017-11-17 – 2017-11-18 (×4): 1 mg via ORAL
  Filled 2017-11-15 (×4): qty 1

## 2017-11-15 MED ORDER — HEPARIN (PORCINE) IN NACL 100-0.45 UNIT/ML-% IJ SOLN
1250.0000 [IU]/h | INTRAMUSCULAR | Status: DC
  Administered 2017-11-15: 1250 [IU]/h via INTRAVENOUS
  Administered 2017-11-15: 1100 [IU]/h via INTRAVENOUS
  Administered 2017-11-16 – 2017-11-20 (×5): 1250 [IU]/h via INTRAVENOUS
  Filled 2017-11-15 (×8): qty 250

## 2017-11-15 MED ORDER — GI COCKTAIL ~~LOC~~: 30.0000 mL | Freq: Four times a day (QID) | ORAL | Status: DC | PRN

## 2017-11-15 MED ORDER — MORPHINE SULFATE (PF) 4 MG/ML IV SOLN
4.0000 mg | Freq: Once | INTRAVENOUS | Status: AC
  Administered 2017-11-15: 4 mg via INTRAVENOUS
  Filled 2017-11-15: qty 1

## 2017-11-15 MED ORDER — ACETAMINOPHEN 325 MG PO TABS
650.0000 mg | ORAL_TABLET | ORAL | Status: DC | PRN
  Administered 2017-11-16: 650 mg via ORAL
  Filled 2017-11-15: qty 2

## 2017-11-15 MED ORDER — MORPHINE SULFATE (PF) 2 MG/ML IV SOLN: 2.0000 mg | INTRAVENOUS | Status: DC | PRN

## 2017-11-15 MED ORDER — ATORVASTATIN CALCIUM 40 MG PO TABS
40.0000 mg | ORAL_TABLET | Freq: Every day | ORAL | Status: DC
  Administered 2017-11-15 – 2017-11-20 (×7): 40 mg via ORAL
  Filled 2017-11-15 (×7): qty 1

## 2017-11-15 NOTE — ED Provider Notes (Signed)
[facility] EMERGENCY DEPARTMENT Provider Note   CSN: [id number] Arrival date & time: 11/14/17  2246     History   Chief Complaint Chief Complaint  Patient presents with  . Chest Pain  . Shortness of Breath    HPI Greg [name] is a 47 y.o. male.  Patient presents to the emergency department with a chief complaint of chest pain and shortness of breath.  Patient reports central chest pain that radiates to his back.  He states that he was admitted for an NSTEMI 2 days ago.  He underwent left heart catheterization and coronary angiography on 10/10, which showed severe three-vessel disease and severe narrowing of the LAD.  Patient was advised to undergo CABG for revascularization.  Patient ended up leaving AMA due to alcohol and tobacco addiction.  Dr. Dorris Carnes from cardiology called the patient back today, and strongly urged him to return to the hospital for readmission.  Patient states that he has had persistent intermittent chest pain symptoms.  He does have pain now.  He states that he feels slightly short of breath, and that the pain radiates to his back.  He has tried taking Tylenol prior to arrival.  He has not had aspirin today.  He denies any lower extremity pain, swelling, numbness, weakness, or tingling.  Denies any other associated symptoms.  The history is provided by the patient. No language interpreter was used.    Past Medical History:  Diagnosis Date  . COPD (chronic obstructive pulmonary disease) ([place])   . Daily headache   . GERD (gastroesophageal reflux disease)   . Skin cancer    "cut off my eyelid and my neck" (11/12/2017)    Patient Active Problem List   Diagnosis Date Noted  . ACS (acute coronary syndrome) ([place]) 11/12/2017    Past Surgical History:  Procedure Laterality Date  . LACERATION REPAIR     "had staples put in my head" (11/12/2017)  . LEFT HEART CATH AND CORONARY ANGIOGRAPHY N/A 11/13/2017   Procedure: LEFT HEART CATH AND  CORONARY ANGIOGRAPHY;  Surgeon: Troy Sine, MD;  Location: [facility];  Service: Cardiovascular;  Laterality: N/A;  . SKIN CANCER EXCISION     "cut off my eyelid and my neck" (11/12/2017)        Home Medications    Prior to Admission medications   Medication Sig Start Date End Date Taking? Authorizing Provider  albuterol (PROVENTIL HFA;VENTOLIN HFA) 108 (90 Base) MCG/ACT inhaler Inhale 2 puffs into the lungs every 6 (six) hours as needed for wheezing or shortness of breath.    [provider]  aspirin EC 81 MG tablet Take 81 mg by mouth daily.    [provider]    Family History Family History  Problem Relation Age of Onset  . CAD Mother   . CAD Maternal Grandmother   . CAD Maternal Grandfather   . CAD Paternal Grandmother   . CAD Paternal Grandfather     Social History Social History   Tobacco Use  . Smoking status: Current Every Day Smoker    Packs/day: 2.50    Years: 35.00    Pack years: 87.50    Types: Cigarettes  . Smokeless tobacco: Never Used  Substance Use Topics  . Alcohol use: Yes    Alcohol/week: 84.0 standard drinks    Types: 84 Cans of beer per week    Comment: 11/12/2017 "12 cans of beer/day"  . Drug use: Yes  Types: Marijuana    Comment: 11/12/2017 "qd"     Allergies   Patient has no known allergies.   Review of Systems Review of Systems  All other systems reviewed and are negative.    Physical Exam Updated Vital Signs BP (!) 143/93   Pulse 95   Temp 97.7 F (36.5 C) (Oral)   Resp 16   SpO2 98%   Physical Exam  Constitutional: He is oriented to person, place, and time. He appears well-developed and well-nourished.  HENT:  Head: Normocephalic and atraumatic.  Eyes: Pupils are equal, round, and reactive to light. Conjunctivae and EOM are normal. Right eye exhibits no discharge. Left eye exhibits no discharge. No scleral icterus.  Neck: Normal range of motion. Neck supple. No JVD present.    Cardiovascular: Normal rate, regular rhythm and normal heart sounds. Exam reveals no gallop and no friction rub.  No murmur heard. Pulmonary/Chest: Effort normal and breath sounds normal. No respiratory distress. He has no wheezes. He has no rales. He exhibits no tenderness.  Abdominal: Soft. He exhibits no distension and no mass. There is no tenderness. There is no rebound and no guarding.  Musculoskeletal: Normal range of motion. He exhibits no edema or tenderness.  Neurological: He is alert and oriented to person, place, and time.  Skin: Skin is warm and dry.  Psychiatric: He has a normal mood and affect. His behavior is normal. Judgment and thought content normal.  Nursing note and vitals reviewed.    ED Treatments / Results  Labs (all labs ordered are listed, but only abnormal results are displayed) Labs Reviewed  BASIC METABOLIC PANEL - Abnormal; Notable for the following components:      Result Value   BUN 5 (*)    All other components within normal limits  CBC - Abnormal; Notable for the following components:   Hemoglobin 17.4 (*)    HCT 53.3 (*)    MCV 102.1 (*)    All other components within normal limits  I-STAT TROPONIN, ED    EKG None  Radiology Dg Chest 2 View  Result Date: 11/14/2017 CLINICAL DATA:  Chest pain and dyspnea x1 week EXAM: CHEST - 2 VIEW COMPARISON:  11/12/2017 FINDINGS: The heart size and mediastinal contours are within normal limits. Emphysematous hyperinflation of the lungs without pneumothorax, effusion or pulmonary consolidation. The visualized skeletal structures are unremarkable. IMPRESSION: Emphysematous hyperinflation of the lungs without acute cardiopulmonary abnormality. Electronically Signed   By: Ashley Royalty M.D.   On: 11/14/2017 23:57    Copied from Cardiac Cath Report from 11/13/17 Severe multivessel coronary obstructive disease with diffuse 50% proximal LAD stenoses followed by 95% stenosis before the takeoff of the first diagonal  vessel with 80% stenosis beyond the diagonal vessel in the mid LAD; 75 and 70% proximal left circumflex stenoses; and diffuse irregularity of the proximal and mid RCA with a narrowings up to 50% with focal 90% acute margin stenosis in a dominant RCA.  Distal vessels are all very large.  Preserved global LV contractility with an ejection fraction of 55 to 60% but with a small region of mid inferior focal hypocontractility.  LVEDP is 15 mmHg.  RECOMMENDATION: In this patient with severe diffuse multivessel coronary obstructive disease and excellent distal targets, recommend surgical consultation for consideration of CABG revascularization.  Initiate high potency statin therapy with target LDL less than 80.  Smoking cessation is imperative.   Procedures Procedures (including critical care time) CRITICAL CARE Performed by: Montine Circle  Nitroglycerin  infusion, heparin infusion Total critical care time: 43 minutes  Critical care time was exclusive of separately billable procedures and treating other patients.  Critical care was necessary to treat or prevent imminent or life-threatening deterioration.  Critical care was time spent personally by me on the following activities: development of treatment plan with patient and/or surrogate as well as nursing, discussions with consultants, evaluation of patient's response to treatment, examination of patient, obtaining history from patient or surrogate, ordering and performing treatments and interventions, ordering and review of laboratory studies, ordering and review of radiographic studies, pulse oximetry and re-evaluation of patient's condition.  Medications Ordered in ED Medications  aspirin chewable tablet 324 mg (has no administration in time range)  nitroGLYCERIN 50 mg in dextrose 5 % 250 mL (0.2 mg/mL) infusion (has no administration in time range)  morphine 4 MG/ML injection 4 mg (has no administration in time range)  LORazepam (ATIVAN)  injection 0-4 mg (has no administration in time range)    Or  LORazepam (ATIVAN) tablet 0-4 mg (has no administration in time range)  LORazepam (ATIVAN) injection 0-4 mg (has no administration in time range)    Or  LORazepam (ATIVAN) tablet 0-4 mg (has no administration in time range)  thiamine (VITAMIN B-1) tablet 100 mg (has no administration in time range)    Or  thiamine (B-1) injection 100 mg (has no administration in time range)  nicotine (NICODERM CQ - dosed in mg/24 hours) patch 21 mg (has no administration in time range)     Initial Impression / Assessment and Plan / ED Course  I have reviewed the triage vital signs and the nursing notes.  Pertinent labs & imaging results that were available during my care of the patient were reviewed by me and considered in my medical decision making (see chart for details).     Patient with severe CAD.  Left the hospital AMA yesterday.  Called by cardiologist, Dr. Harrington Challenger, to return to the hospital.  Patient has had persistent bouts of chest pain since leaving AMA.  He returns now, and would like to proceed with revascularization and treatment of his CAD.  Will consult cardiology for admission.  Will give aspirin, start nitro drip for pain control, and will restart heparin drip.  Discussed with on-call cardiologist, who recommends medicine admit.  States will likely need CT surg over cardiology, but advised hospitalist to consult cardiology as needed.  Appreciate Dr. [name], who will admit.  Final Clinical Impressions(s) / ED Diagnoses   Final diagnoses:  Unstable angina pectoris [facility])    ED Discharge Orders    None       Montine Circle, PA-C 11/15/17 0124    Duffy Bruce, MD 11/15/17 [phone]

## 2017-11-15 NOTE — H&P (Signed)
History and Physical    Greg [name] BWL:[id number] DOB: Nov 04, 1970 DOA: 11/14/2017  Referring MD/NP/PA: Montine Circle, PA-C PCP: Patient, No Pcp Per  Patient coming from: home  Chief Complaint: Chest pain  I have personally briefly reviewed patient's old medical records in [place]   HPI: Greg [name] is a 47 y.o. male with medical history significant of CAD, COPD, alcohol abuse, tobacco abuse; who had initially presented to the hospital on 10/9 with complaints of chest pain and shortness of breath found to have elevated troponin for which he underwent cardiac catheterization on 10/10 showing evidence of severe three-vessel disease that was amenable to CABG.  However, patient left the hospital AMA.  Since being home patient reports having continued substernal chest pain radiating to his back with associated symptoms of diaphoresis and shortness of breath.  He was called by Dr. Dorris Carnes of cardiology who strongly urged him to return to the hospital.  He reports taking Tylenol without any relief of pain.  While at home patient reports that he continues to drink alcohol.  He normally drinks 12 beers per day on average.  His last drink was yesterday morning.  ED Course: Upon admission into the emergency department patient was noted to be afebrile, pulse 94-1 15, respirations 10-23, blood pressure 113/95-160 3/126, and O2 saturations maintained on room air.  Labs revealed WBC 10.1, hemoglobin 17.4, and troponin i 0.04.  EKG appears similar to previous.  Cardiology was formally consulted.  TRH was called to admit.  Review of Systems  Constitutional: Positive for diaphoresis.  Respiratory: Positive for shortness of breath.   Cardiovascular: Positive for chest pain.  Psychiatric/Behavioral: Positive for substance abuse.    Past Medical History:  Diagnosis Date  . COPD (chronic obstructive pulmonary disease) ([place])   . Daily headache   . GERD (gastroesophageal reflux disease)    . Skin cancer    "cut off my eyelid and my neck" (11/12/2017)    Past Surgical History:  Procedure Laterality Date  . LACERATION REPAIR     "had staples put in my head" (11/12/2017)  . LEFT HEART CATH AND CORONARY ANGIOGRAPHY N/A 11/13/2017   Procedure: LEFT HEART CATH AND CORONARY ANGIOGRAPHY;  Surgeon: Troy Sine, MD;  Location: [facility];  Service: Cardiovascular;  Laterality: N/A;  . SKIN CANCER EXCISION     "cut off my eyelid and my neck" (11/12/2017)     reports that he has been smoking cigarettes. He has a 87.50 pack-year smoking history. He has never used smokeless tobacco. He reports that he drinks about 84.0 standard drinks of alcohol per week. He reports that he has current or past drug history. Drug: Marijuana.  No Known Allergies  Family History  Problem Relation Age of Onset  . CAD Mother   . CAD Maternal Grandmother   . CAD Maternal Grandfather   . CAD Paternal Grandmother   . CAD Paternal Grandfather     Prior to Admission medications   Medication Sig Start Date End Date Taking? Authorizing Provider  albuterol (PROVENTIL HFA;VENTOLIN HFA) 108 (90 Base) MCG/ACT inhaler Inhale 2 puffs into the lungs every 6 (six) hours as needed for wheezing or shortness of breath.    [provider]  aspirin EC 81 MG tablet Take 81 mg by mouth daily.    [provider]    Physical Exam:  Constitutional: NAD, calm, comfortable Vitals:   11/15/17 0133 11/15/17 0139 11/15/17 0142 11/15/17 0200  BP: Marland Kitchen)  133/99 (!) 138/97 (!) 134/92 (!) 137/93  Pulse: 98 (!) 101 100 (!) 112  Resp: 15 16 (!) 23 17  Temp:      TempSrc:      SpO2: 94% 95% 95% 94%   Eyes: PERRL, lids and conjunctivae normal ENMT: Mucous membranes are moist. Posterior pharynx clear of any exudate or lesions.Normal dentition.  Neck: normal, supple, no masses, no thyromegaly Respiratory: clear to auscultation bilaterally, no wheezing, no crackles. Normal respiratory effort. No  accessory muscle use.  Cardiovascular: Regular rate and rhythm, no murmurs / rubs / gallops. No extremity edema. 2+ pedal pulses. No carotid bruits.  Abdomen: no tenderness, no masses palpated. No hepatosplenomegaly. Bowel sounds positive.  Musculoskeletal: no clubbing / cyanosis. No joint deformity upper and lower extremities. Good ROM, no contractures. Normal muscle tone.  Skin: no rashes, lesions, ulcers. No induration Neurologic: CN 2-12 grossly intact. Sensation intact, DTR normal. Strength 5/5 in all 4.  Psychiatric: Normal judgment and insight. Alert and oriented x 3. Normal mood.     Labs on Admission: I have personally reviewed following labs and imaging studies  CBC: Recent Labs  Lab 11/12/17 1639 11/13/17 0416 11/14/17 2304  WBC  --  7.5 10.1  HGB  --  15.0 17.4*  HCT 43.9 44.3 53.3*  MCV  --  100.5* 102.1*  PLT  --  272 850   Basic Metabolic Panel: Recent Labs  Lab 11/13/17 0416 11/14/17 2304  NA 137 136  K 3.5 3.5  CL 102 101  CO2 25 25  GLUCOSE 111* 72  BUN <5* 5*  CREATININE 0.89 1.01  CALCIUM 8.6* 9.3   GFR: Estimated Creatinine Clearance: 82.3 mL/min (by C-G formula based on SCr of 1.01 mg/dL). Liver Function Tests: Recent Labs  Lab 11/13/17 0416  AST 51*  ALT 45*  ALKPHOS 67  BILITOT 1.1  PROT 5.8*  ALBUMIN 2.8*   No results for input(s): LIPASE, AMYLASE in the last 168 hours. No results for input(s): AMMONIA in the last 168 hours. Coagulation Profile: Recent Labs  Lab 11/13/17 0416  INR 0.98   Cardiac Enzymes: Recent Labs  Lab 11/12/17 1639 11/12/17 2218 11/13/17 0416  TROPONINI 0.23* 0.13* 0.13*   BNP (last 3 results) No results for input(s): PROBNP in the last 8760 hours. HbA1C: No results for input(s): HGBA1C in the last 72 hours. CBG: No results for input(s): GLUCAP in the last 168 hours. Lipid Profile: No results for input(s): CHOL, HDL, LDLCALC, TRIG, CHOLHDL, LDLDIRECT in the last 72 hours. Thyroid Function  Tests: No results for input(s): TSH, T4TOTAL, FREET4, T3FREE, THYROIDAB in the last 72 hours. Anemia Panel: No results for input(s): VITAMINB12, FOLATE, FERRITIN, TIBC, IRON, RETICCTPCT in the last 72 hours. Urine analysis: No results found for: COLORURINE, APPEARANCEUR, LABSPEC, PHURINE, GLUCOSEU, HGBUR, BILIRUBINUR, KETONESUR, PROTEINUR, UROBILINOGEN, NITRITE, LEUKOCYTESUR Sepsis Labs: No results found for this or any previous visit (from the past 240 hour(s)).   Radiological Exams on Admission: Dg Chest 2 View  Result Date: 11/14/2017 CLINICAL DATA:  Chest pain and dyspnea x1 week EXAM: CHEST - 2 VIEW COMPARISON:  11/12/2017 FINDINGS: The heart size and mediastinal contours are within normal limits. Emphysematous hyperinflation of the lungs without pneumothorax, effusion or pulmonary consolidation. The visualized skeletal structures are unremarkable. IMPRESSION: Emphysematous hyperinflation of the lungs without acute cardiopulmonary abnormality. Electronically Signed   By: Ashley Royalty M.D.   On: 11/14/2017 23:57    EKG: Independently reviewed.  Sinus rhythm at 94 bpm  Assessment/Plan Coronary  artery disease, Recent NSTEMI, : Patient just recently admitted for NSTEMI found to have three-vessel disease amenable to CABG.   - Admit to stepdown bed - Trend cardiac troponins overnight - Continue heparin gtt and nitroglycerin gtt - Morphine as needed pain - Follow-up lipid panel - Appreciate cardiology consultative services, will follow-up for further recommendation  Alcohol abuse: Acute on chronic.  Patient reports last drink yesterday. - CIWA protocols - Counsel need of cessation of alcohol use  Transaminitis: Labs from 10/10 noted AST and ALT mildly elevated at 51 and 45 respectively.  Suspect likely related with alcohol abuse.   Hyperlipidemia: - Follow-up lipid panel - Atorvastatin 40 mg p.o. Daily  Tobacco abuse - Nicotine patch - Counsel need cessation of tobacco use  DVT  prophylaxis: Heparin gtt Code Status:Full Family Communication: None present Disposition Plan: To be determined Consults called:  Cardiology  Admission status: inpatient  Norval Morton MD [facility] Pager [phone]   If 7PM-7AM, please contact night-coverage [email or web address] Password [email or web address]  11/15/2017, 2:20 AM

## 2017-11-15 NOTE — Consult Note (Signed)
Cardiology Consultation:   Patient ID: Greg [name] MRN: [id number]; DOB: February 27, 1970  Admit date: 11/14/2017 Date of Consult: 11/15/2017  Primary Care Provider: Patient, No Pcp Per Primary Cardiologist: Harrington Challenger  Patient Profile:   Greg [name] is a 47 y.o. male with a hx of CAD, tobacco abuse, alcohol abuse, COPD who is being seen today for the evaluation of CAD and chest pain at the request of Dr. [name].  History of Present Illness:   Greg [name] is a 47 y.o. male with a hx of CAD, tobacco abuse, alcohol abuse, COPD with a recent hospitalization for NSTEMI with diagnosis of severe 3V CAD who left AMA and presents for readmission.  The patient was recently hospitalized 10/9-10/11 with NSTEMI. He underwent LHC that showed 3V CAD, and CABG was recommended; he was evaluated by CT surgery, who planned for operation in the next several days. He was agitated throughout his hospitalization due to inability to drink alcohol, and he left AMA.   He was called by Dr. Harrington Challenger, who recommended that he return to the hospital. He came back to the ED. He returned this evening and complained of ongoing, intermittent chest pain and dyspnea. ECG showed NSR with nonspecific changes similar to prior study. Initial troponin negative. He was started on heparin and NTG infusions and readmitted to to the hospitalist service. Cardiology was consulted for further recommendations.   On my evaluation, the patient is resting in bed. He denies any active chest pain or dyspnea or other symptoms. He states that he is motivated to stop drinking alcohol and stop smoking.   Past Medical History:  Diagnosis Date  . COPD (chronic obstructive pulmonary disease) ([place])   . Daily headache   . GERD (gastroesophageal reflux disease)   . Skin cancer    "cut off my eyelid and my neck" (11/12/2017)    Past Surgical History:  Procedure Laterality Date  . LACERATION REPAIR     "had staples put in my head" (11/12/2017)  . LEFT  HEART CATH AND CORONARY ANGIOGRAPHY N/A 11/13/2017   Procedure: LEFT HEART CATH AND CORONARY ANGIOGRAPHY;  Surgeon: Troy Sine, MD;  [facility];  Service: Cardiovascular;  Laterality: N/A;  . SKIN CANCER EXCISION     "cut off my eyelid and my neck" (11/12/2017)     Home Medications:  Prior to Admission medications   Medication Sig Start Date End Date Taking? Authorizing Provider  albuterol (PROVENTIL HFA;VENTOLIN HFA) 108 (90 Base) MCG/ACT inhaler Inhale 2 puffs into the lungs every 6 (six) hours as needed for wheezing or shortness of breath.    [provider]  aspirin EC 81 MG tablet Take 81 mg by mouth daily.    [provider]    Inpatient Medications: Scheduled Meds: . LORazepam  0-4 mg Intravenous Q6H   Or  . LORazepam  0-4 mg Oral Q6H  . [START ON 11/17/2017] LORazepam  0-4 mg Intravenous Q12H   Or  . [START ON 11/17/2017] LORazepam  0-4 mg Oral Q12H  . nicotine  21 mg Transdermal Once  . thiamine  100 mg Oral Daily   Or  . thiamine  100 mg Intravenous Daily   Continuous Infusions: . heparin    . nitroGLYCERIN 10 mcg/min (11/15/17 0119)   PRN Meds:   Allergies:   No Known Allergies  Social History:   Social History   Socioeconomic History  . Marital status: Single    Spouse name: Not on file  .  Number of children: Not on file  . Years of education: Not on file  . Highest education level: Not on file  Occupational History  . Not on file  Social Needs  . Financial resource strain: Not on file  . Food insecurity:    Worry: Not on file    Inability: Not on file  . Transportation needs:    Medical: Not on file    Non-medical: Not on file  Tobacco Use  . Smoking status: Current Every Day Smoker    Packs/day: 2.50    Years: 35.00    Pack years: 87.50    Types: Cigarettes  . Smokeless tobacco: Never Used  Substance and Sexual Activity  . Alcohol use: Yes    Alcohol/week: 84.0 standard drinks    Types: 84 Cans of  beer per week    Comment: 11/12/2017 "12 cans of beer/day"  . Drug use: Yes    Types: Marijuana    Comment: 11/12/2017 "qd"  . Sexual activity: Not on file  Lifestyle  . Physical activity:    Days per week: Not on file    Minutes per session: Not on file  . Stress: Not on file  Relationships  . Social connections:    Talks on phone: Not on file    Gets together: Not on file    Attends religious service: Not on file    Active member of club or organization: Not on file    Attends meetings of clubs or organizations: Not on file    Relationship status: Not on file  . Intimate partner violence:    Fear of current or ex partner: Not on file    Emotionally abused: Not on file    Physically abused: Not on file    Forced sexual activity: Not on file  Other Topics Concern  . Not on file  Social History Narrative  . Not on file    Family History:     Family History  Problem Relation Age of Onset  . CAD Mother   . CAD Maternal Grandmother   . CAD Maternal Grandfather   . CAD Paternal Grandmother   . CAD Paternal Grandfather      ROS:  Please see the history of present illness.  All other ROS reviewed and negative.     Physical Exam/Data:   Vitals:   11/15/17 0015 11/15/17 0030 11/15/17 0100 11/15/17 0112  BP: (!) 113/95 (!) 143/93 123/83 123/83  Pulse: 94 95 (!) 102 (!) 108  Resp: 10 16 11    Temp:      TempSrc:      SpO2: 98% 98% 96%    No intake or output data in the 24 hours ending 11/15/17 0146 There were no vitals filed for this visit. There is no height or weight on file to calculate BMI.  General:  Well nourished, well developed, in no acute distress  HEENT: normal Lymph: no adenopathy Neck: no JVD Cardiac:  normal S1, S2; RRR; no murmur  Lungs:  Some wheezes bilaterally Abd: soft, nontender Ext: no edema Musculoskeletal:  No deformities, BUE and BLE strength normal and equal Skin: warm and dry  Neuro: No focal abnormalities noted Psych:  Normal affect    EKG:  The EKG was personally reviewed and demonstrates:  NSR with nonspecific ST changes Telemetry:  Telemetry was personally reviewed and demonstrates:  Sinus rhythm with no events  Relevant CV Studies:  TTE 11/13/17 - LVEF 60-65%, normal wall thickness, normal wall motion,  grade 1 DD, normal LV filling pressure, normal LA size, normal IVC.  LHC 11/13/17  Dist RCA lesion is 90% stenosed.  Prox RCA lesion is 30% stenosed.  Prox RCA to Mid RCA lesion is 50% stenosed.  Mid RCA lesion is 50% stenosed.  Prox Cx lesion is 75% stenosed.  Mid Cx lesion is 70% stenosed.  Ost LAD lesion is 50% stenosed.  Prox LAD-1 lesion is 50% stenosed.  Prox LAD-2 lesion is 95% stenosed.  Mid LAD lesion is 80% stenosed.  LV end diastolic pressure is normal.  The left ventricular systolic function is normal.   Severe multivessel coronary obstructive disease with diffuse 50% proximal LAD stenoses followed by 95% stenosis before the takeoff of the first diagonal vessel with 80% stenosis beyond the diagonal vessel in the mid LAD; 75 and 70% proximal left circumflex stenoses; and diffuse irregularity of the proximal and mid RCA with a narrowings up to 50% with focal 90% acute margin stenosis in a dominant RCA.  Distal vessels are all very large.  Preserved global LV contractility with an ejection fraction of 55 to 60% but with a small region of mid inferior focal hypocontractility.  LVEDP is 15 mmHg.  RECOMMENDATION: In this patient with severe diffuse multivessel coronary obstructive disease and excellent distal targets, recommend surgical consultation for consideration of CABG revascularization.  Initiate high potency statin therapy with target LDL less than 80.  Smoking cessation is imperative.  Recommend Aspirin 96m daily for moderate CAD.  Will not start DAPT in anticipation of CABG revascularization surgery.  Will resume heparin 10 hours post procedure.  Laboratory  Data:  Chemistry Recent Labs  Lab 11/13/17 0416 11/14/17 2304  NA 137 136  K 3.5 3.5  CL 102 101  CO2 25 25  GLUCOSE 111* 72  BUN <5* 5*  CREATININE 0.89 1.01  CALCIUM 8.6* 9.3  GFRNONAA >60 >60  GFRAA >60 >60  ANIONGAP 10 10    Recent Labs  Lab 11/13/17 0416  PROT 5.8*  ALBUMIN 2.8*  AST 51*  ALT 45*  ALKPHOS 67  BILITOT 1.1   Hematology Recent Labs  Lab 11/12/17 1639 11/13/17 0416 11/14/17 2304  WBC  --  7.5 10.1  RBC  --  4.41 5.22  HGB  --  15.0 17.4*  HCT 43.9 44.3 53.3*  MCV  --  100.5* 102.1*  MCH  --  34.0 33.3  MCHC  --  33.9 32.6  RDW  --  12.8 12.8  PLT  --  272 309   Cardiac Enzymes Recent Labs  Lab 11/12/17 1639 11/12/17 2218 11/13/17 0416  TROPONINI 0.23* 0.13* 0.13*    Recent Labs  Lab 11/14/17 2316  TROPIPOC 0.04    BNPNo results for input(s): BNP, PROBNP in the last 168 hours.  DDimer No results for input(s): DDIMER in the last 168 hours.  Radiology/Studies:  Dg Chest 2 View  Result Date: 11/14/2017 CLINICAL DATA:  Chest pain and dyspnea x1 week EXAM: CHEST - 2 VIEW COMPARISON:  11/12/2017 FINDINGS: The heart size and mediastinal contours are within normal limits. Emphysematous hyperinflation of the lungs without pneumothorax, effusion or pulmonary consolidation. The visualized skeletal structures are unremarkable. IMPRESSION: Emphysematous hyperinflation of the lungs without acute cardiopulmonary abnormality. Electronically Signed   By: DAshley RoyaltyM.D.   On: 11/14/2017 23:57   Dg Chest 2 View  Result Date: 11/12/2017 CLINICAL DATA:  Shortness of breath and chest pain EXAM: CHEST - 2 VIEW COMPARISON:  CT of the chest  from earlier in the same day. FINDINGS: The heart size and mediastinal contours are within normal limits. Both lungs are hyperinflated. The visualized skeletal structures are unremarkable. IMPRESSION: COPD without acute abnormality. Electronically Signed   By: Inez Catalina M.D.   On: 11/12/2017 21:49    Assessment  and Plan:   CAD with recent NSTEMI Chest pain The patient was recently hospitalized with NSTEMI and found to have 3V disease. CABG was planned, but the patient left AMA due to alcohol withdrawal. He has had intermittent chest pain since he left and presents back to the ED. ECG is largely stable, and troponin is negative x1. He is currently chest pain free after receiving NTG. He will need close monitoring until revascularization. -Continue NTG for symptom relief -Continuation of IV heparin reasonable, as he likely did not complete 48 hours for NSTEMI -Continue ASA -Lipid panel ordered. Should be on high intensity statin if hepatic function stable  -Will need to re-engage CT surgery   Alcohol abuse Tobacco abuse Pt motivated to quit, and he was encouraged with these goals. -Will need ongoing substance abuse counseling and CIWA      For questions or updates, please contact [place] Please consult [email or web address] for contact info under     Signed, Nila Nephew, MD  11/15/2017 1:46 AM

## 2017-11-15 NOTE — Progress Notes (Signed)
Patient seen and evaluated, chart reviewed, please see EMR for updated orders. Please see full H&P dictated by admitting physician Dr. [name] for same date of service.    47 y.o. male with a hx of CAD, tobacco abuse, alcohol abuse, COPD  admitted on 11/15/2017 after leaving AMA on 11/13/17   1)CAD--- CT surgery consult for CABG, continue IV heparin and IV nitro  2)Eth Abuse-lorazepam per CIWA protocol  3) Tobacco abuse--not ready to quit smoking  4)Social/Ethics--Full code, plan of care discussed with patient, his sister and significant other at bedside   Patient seen and evaluated, chart reviewed, please see EMR for updated orders. Please see full H&P dictated by admitting physician Dr. [name] for same date of service.

## 2017-11-15 NOTE — Progress Notes (Signed)
[place] for Heparin  Indication: chest pain/ACS, recent cath, severe multi-vessel disease  No Known Allergies  Vital Signs: Temp: 98.2 F (36.8 C) (10/12 1254) Temp Source: Oral (10/12 1254) BP: 141/107 (10/12 1702) Pulse Rate: 107 (10/12 1702)  Labs: Recent Labs    11/13/17 0416 11/14/17 2304 11/15/17 0422 11/15/17 0847 11/15/17 1044 11/15/17 1659 11/15/17 1839  HGB 15.0 17.4* 15.8  --   --   --   --   HCT 44.3 53.3* 47.8  --   --   --   --   PLT 272 309 280  --   --   --   --   LABPROT 12.9  --   --   --   --   --   --   INR 0.98  --   --   --   --   --   --   HEPARINUNFRC 0.25*  --   --  0.25*  --   --  0.44  CREATININE 0.89 1.01 1.00  --   --   --   --   TROPONINI 0.13*  --  0.06*  --  0.06* 0.05*  --     Estimated Creatinine Clearance: 83.2 mL/min (by C-G formula based on SCr of 1 mg/dL).  Medical History: Past Medical History:  Diagnosis Date  . COPD (chronic obstructive pulmonary disease) ([place])   . Daily headache   . GERD (gastroesophageal reflux disease)   . Skin cancer    "cut off my eyelid and my neck" (11/12/2017)    Assessment:  17 yoM with recent cath on 10/10 that revealed multi-vessel disease, planning for CABG. Patient then left AMA, now back with CP. Heparin level now within goal range at 0.44 after rate increase this AM. CBC WNL, no bleeding noted.  Goal of Therapy:  Heparin level 0.3-0.7 units/ml Monitor platelets by anticoagulation protocol: Yes   Plan:  Continue heparin gtt at 1250 units/hr Recheck heparin level with AM labs Daily CBC and heparin level Monitor for s/sx of bleeding F/u CABG plans  Erin N. Gerarda Fraction, PharmD PGY2 Infectious Diseases Pharmacy Resident Phone: [phone] 11/15/2017 7:31 PM

## 2017-11-15 NOTE — Progress Notes (Signed)
[place] for Heparin  Indication: chest pain/ACS, recent cath, severe multi-vessel disease  No Known Allergies  Vital Signs: Temp: 98.1 F (36.7 C) (10/12 0816) Temp Source: Oral (10/12 0816) BP: 123/96 (10/12 0816) Pulse Rate: 93 (10/12 0816)  Labs: Recent Labs    11/12/17 2218  11/13/17 0416 11/14/17 2304 11/15/17 0422 11/15/17 0847  HGB  --    < > 15.0 17.4* 15.8  --   HCT  --   --  44.3 53.3* 47.8  --   PLT  --   --  272 309 280  --   LABPROT  --   --  12.9  --   --   --   INR  --   --  0.98  --   --   --   HEPARINUNFRC  --   --  0.25*  --   --  0.25*  CREATININE  --   --  0.89 1.01 1.00  --   TROPONINI 0.13*  --  0.13*  --  0.06*  --    < > = values in this interval not displayed.    Estimated Creatinine Clearance: 83.2 mL/min (by C-G formula based on SCr of 1 mg/dL).  Medical History: Past Medical History:  Diagnosis Date  . COPD (chronic obstructive pulmonary disease) ([place])   . Daily headache   . GERD (gastroesophageal reflux disease)   . Skin cancer    "cut off my eyelid and my neck" (11/12/2017)    Assessment: 47 y/o M with recent cath on 10/10 that revealed multi-vessel disease, planning for CABG, pt then left AMA, now back with CP, CBC good, renal function good.   Initial heparin level subtherapeutic: 0.25, no infusion issues or overt bleeding  Goal of Therapy:  Heparin level 0.3-0.7 units/ml Monitor platelets by anticoagulation protocol: Yes   Plan:  No bolus with recent cath Increase heparin gtt to 1250 units/hr Daily CBC/ heparin level Monitor for bleeding F/U plans-needs CABG  Georga Bora, PharmD Clinical Pharmacist 11/15/2017 11:19 AM Please check AMION for all [facility] numbers

## 2017-11-15 NOTE — Progress Notes (Signed)
ANTICOAGULATION CONSULT NOTE - Initial Consult  Pharmacy Consult for Heparin  Indication: chest pain/ACS, recent cath, severe multi-vessel disease  No Known Allergies  Vital Signs: Temp: 97.7 F (36.5 C) (10/11 2254) Temp Source: Oral (10/11 2254) BP: 143/93 (10/12 0030) Pulse Rate: 95 (10/12 0030)  Labs: Recent Labs    11/12/17 1639 11/12/17 2218 11/13/17 0416 11/14/17 2304  HGB  --   --  15.0 17.4*  HCT 43.9  --  44.3 53.3*  PLT  --   --  272 309  LABPROT  --   --  12.9  --   INR  --   --  0.98  --   HEPARINUNFRC  --   --  0.25*  --   CREATININE  --   --  0.89 1.01  TROPONINI 0.23* 0.13* 0.13*  --     Estimated Creatinine Clearance: 82.3 mL/min (by C-G formula based on SCr of 1.01 mg/dL).  Medical History: Past Medical History:  Diagnosis Date  . COPD (chronic obstructive pulmonary disease) ([place])   . Daily headache   . GERD (gastroesophageal reflux disease)   . Skin cancer    "cut off my eyelid and my neck" (11/12/2017)    Assessment: 47 y/o M with recent cath on 10/10 that revealed multi-vessel disease, planning for CABG, pt then left AMA, now back with CP, CBC good, renal function good.   Goal of Therapy:  Heparin level 0.3-0.7 units/ml Monitor platelets by anticoagulation protocol: Yes   Plan:  No bolus with recent cath Start heparin drip at 1100 units/hr 0900 HL Daily CBC/HL Monitor for bleeding F/U plans-needs CABG  Narda Bonds 11/15/2017,12:40 AM

## 2017-11-16 ENCOUNTER — Inpatient Hospital Stay (HOSPITAL_COMMUNITY): Payer: Medicaid Other

## 2017-11-16 DIAGNOSIS — I25110 Atherosclerotic heart disease of native coronary artery with unstable angina pectoris: Secondary | ICD-10-CM

## 2017-11-16 DIAGNOSIS — I214 Non-ST elevation (NSTEMI) myocardial infarction: Principal | ICD-10-CM

## 2017-11-16 LAB — CBC
HCT: 42.7 % (ref 39.0–52.0)
Hemoglobin: 13.9 g/dL (ref 13.0–17.0)
MCH: 33.2 pg (ref 26.0–34.0)
MCHC: 32.6 g/dL (ref 30.0–36.0)
MCV: 101.9 fL — ABNORMAL HIGH (ref 80.0–100.0)
Platelets: 253 10*3/uL (ref 150–400)
RBC: 4.19 MIL/uL — ABNORMAL LOW (ref 4.22–5.81)
RDW: 12.7 % (ref 11.5–15.5)
WBC: 8.7 10*3/uL (ref 4.0–10.5)
nRBC: 0 % (ref 0.0–0.2)

## 2017-11-16 LAB — HEPARIN LEVEL (UNFRACTIONATED): HEPARIN UNFRACTIONATED: 0.43 [IU]/mL (ref 0.30–0.70)

## 2017-11-16 MED ORDER — FOLIC ACID 1 MG PO TABS
1.0000 mg | ORAL_TABLET | Freq: Every day | ORAL | Status: DC
  Administered 2017-11-16 – 2017-11-20 (×5): 1 mg via ORAL
  Filled 2017-11-16 (×5): qty 1

## 2017-11-16 MED ORDER — LORAZEPAM 2 MG/ML IJ SOLN: 0.0000 mg | Freq: Four times a day (QID) | INTRAMUSCULAR | Status: DC

## 2017-11-16 MED ORDER — ALBUTEROL SULFATE (2.5 MG/3ML) 0.083% IN NEBU: 2.5000 mg | INHALATION_SOLUTION | RESPIRATORY_TRACT | Status: DC | PRN

## 2017-11-16 MED ORDER — LORAZEPAM 1 MG PO TABS: 0.0000 mg | ORAL_TABLET | Freq: Four times a day (QID) | ORAL | Status: DC

## 2017-11-16 MED ORDER — METOPROLOL TARTRATE 12.5 MG HALF TABLET
12.5000 mg | ORAL_TABLET | Freq: Two times a day (BID) | ORAL | Status: DC
  Administered 2017-11-16 – 2017-11-20 (×10): 12.5 mg via ORAL
  Filled 2017-11-16 (×10): qty 1

## 2017-11-16 NOTE — Consult Note (Signed)
[address]       [place] [address]             [phone]        Ladainian H Alder Record #[id number] Date of Birth: [birth year]  Referring: Dr. Claiborne Billings Primary Care: Patient, No Pcp Per Primary Cardiologist:No primary care provider on file.  Chief Complaint:   Chest pain/ shortness of breath  History of Present Illness:      Mr. Aquan Kope is a 47 year old male patient with a past medical history significant for COPD, GERD, alcoholism, and current everyday smoker who was transferred from [facility] emergency room after he presented with recurrent chest pain.  He originally went to the [facility] emergency room September 16 and was discharged home with a diagnosis of lung nodule seen on chest x-ray and unstable angina.  He returned to the [facility] emergency room his last episode of chest pain was on 11/11/2017 with prolonged episode of chest pain, was discharged home and then called back several hours later and told to come immediately back to the emergency room.  The pain is described as chest discomfort radiating to the back associated with shortness of breath, nausea, cold sweats and vomiting.  His initial troponin was slightly elevated at 0.23.  There were no significant EKG changes.  After returning to the emergency room when called he was ED he was started on heparin drip and Nitropaste.  He was then transferred to [facility] for further care.  He ultimately underwent cardiac catheterization on 11/13/2017 at which showed severe multivessel coronary obstructive disease.  Including high-grade LAD disease .  He had preserved LV contractility with an ejection fraction of 55 to 60%.   Cardiac surgery consulted for consideration of surgical revascularization.  The patient has been debilitated and severely nutritionally depleted because of chronic abdominal pain and high levels of alcohol intake, patient admits to 12 pack of beer a day.  During our  discussion the patient was very anxious to go home.  He became agitated and wanted to rip his IV out and leave .   On Thursday evening, 3 days ago the patient was originally seen coronary artery bypass grafting was discussed with him in detail, the underlying diagnosis was discussed with he and his family.  After seeing the patient he signed out [facility] Thursday night.  He now returns to reconsider his treatment options.  He notes he did go home and continue to drink heavily.  He was told by cardiology he could have beer if he returned.  Current Activity/ Functional Status: Patient was independent with mobility/ambulation, transfers, ADL's, IADL's.   Zubrod Score: At the time of surgery this patient's most appropriate activity status/level should be described as: []     0    Normal activity, no symptoms [x]     1    Restricted in physical strenuous activity but ambulatory, able to do out light work []     2    Ambulatory and capable of self care, unable to do work activities, up and about                 more than 50%  Of the time                            []     3    Only limited self care, in bed greater than 50% of waking hours []   4    Completely disabled, no self care, confined to bed or chair []     5    Moribund  Past Medical History:  Diagnosis Date  . COPD (chronic obstructive pulmonary disease) ([place])   . Daily headache   . GERD (gastroesophageal reflux disease)   . Skin cancer    "cut off my eyelid and my neck" (11/12/2017)    Past Surgical History:  Procedure Laterality Date  . LACERATION REPAIR     "had staples put in my head" (11/12/2017)  . LEFT HEART CATH AND CORONARY ANGIOGRAPHY N/A 11/13/2017   Procedure: LEFT HEART CATH AND CORONARY ANGIOGRAPHY;  Surgeon: Troy Sine, MD;  Location: [facility];  Service: Cardiovascular;  Laterality: N/A;  . SKIN CANCER EXCISION     "cut off my eyelid and my neck" (11/12/2017)    Social History   Tobacco Use  Smoking Status  Current Every Day Smoker  . Packs/day: 2.50  . Years: 35.00  . Pack years: 87.50  . Types: Cigarettes  Smokeless Tobacco Never Used    Social History   Substance and Sexual Activity  Alcohol Use Yes  . Alcohol/week: 84.0 standard drinks  . Types: 84 Cans of beer per week   Comment: 11/12/2017 "12 cans of beer/day"     No Known Allergies  Current Facility-Administered Medications  Medication Dose Route Frequency Provider Last Rate Last Dose  . acetaminophen (TYLENOL) tablet 650 mg  650 mg Oral Q4H PRN Fuller Plan A, MD   650 mg at 11/16/17 0918  . albuterol (PROVENTIL) (2.5 MG/3ML) 0.083% nebulizer solution 2.5 mg  2.5 mg Nebulization Q2H PRN Emokpae, Courage, MD      . atorvastatin (LIPITOR) tablet 40 mg  40 mg Oral q1800 [name], Rondell A, MD   40 mg at 11/15/17 1715  . folic acid (FOLVITE) tablet 1 mg  1 mg Oral Daily Emokpae, Courage, MD   1 mg at 11/16/17 1222  . gi cocktail (Maalox,Lidocaine,Donnatal)  30 mL Oral QID PRN Fuller Plan A, MD      . heparin ADULT infusion 100 units/mL (25000 units/226m sodium chloride 0.45%)  1,250 Units/hr Intravenous Continuous Rudisill, TJodean Lima RPH 12.5 mL/hr at 11/16/17 0100 1,250 Units/hr at 11/16/17 0100  . LORazepam (ATIVAN) injection 0-4 mg  0-4 mg Intravenous Q6H Borden, Rondell A, MD   2 mg at 11/15/17 1714   Or  . LORazepam (ATIVAN) tablet 0-4 mg  0-4 mg Oral Q6H Pare, Rondell A, MD   2 mg at 11/16/17 1222  . [START ON 11/17/2017] LORazepam (ATIVAN) injection 0-4 mg  0-4 mg Intravenous Q12H Ugarte, Rondell A, MD       Or  . [Derrill MemoON 11/17/2017] LORazepam (ATIVAN) tablet 0-4 mg  0-4 mg Oral Q12H Amason, Rondell A, MD      . metoprolol tartrate (LOPRESSOR) tablet 12.5 mg  12.5 mg Oral BID Emokpae, Courage, MD   12.5 mg at 11/16/17 1222  . morphine 2 MG/ML injection 2 mg  2 mg Intravenous Q2H PRN Grondahl, Rondell A, MD      . nicotine (NICODERM CQ - dosed in mg/24 hours) patch 21 mg  21 mg Transdermal Daily SFuller PlanA, MD   21 mg  at 11/16/17 0918  . nitroGLYCERIN 50 mg in dextrose 5 % 250 mL (0.2 mg/mL) infusion  0-20 mcg/min Intravenous Titrated Farnell, Rondell A, MD 6 mL/hr at 11/16/17 0100 20 mcg/min at 11/16/17 0100  . ondansetron (ZOFRAN) injection 4 mg  4 mg Intravenous Q6H PRN Fuller Plan A, MD      . thiamine (VITAMIN B-1) tablet 100 mg  100 mg Oral Daily [name], Rondell A, MD   100 mg at 11/16/17 0388   Or  . thiamine (B-1) injection 100 mg  100 mg Intravenous Daily Fuller Plan A, MD        Medications Prior to Admission  Medication Sig Dispense Refill Last Dose  . albuterol (PROVENTIL HFA;VENTOLIN HFA) 108 (90 Base) MCG/ACT inhaler Inhale 2 puffs into the lungs every 6 (six) hours as needed for wheezing or shortness of breath.   Past Week at Unknown time  . aspirin EC 81 MG tablet Take 81 mg by mouth daily.   Past Week at Unknown time    Family History  Problem Relation Age of Onset  . CAD Mother   . CAD Maternal Grandmother   . CAD Maternal Grandfather   . CAD Paternal Grandmother   . CAD Paternal Grandfather      Review of Systems:   Review of Systems  Constitutional: Positive for diaphoresis. Negative for chills and fever.  Respiratory: Positive for shortness of breath. Negative for cough, sputum production and wheezing.   Cardiovascular: Positive for chest pain.  Gastrointestinal: Positive for heartburn, nausea and vomiting.  Psychiatric/Behavioral: Positive for substance abuse.   Pertinent items are noted in HPI.      Physical Exam: BP 110/88 (BP Location: Left Arm)   Pulse 99   Temp 98.4 F (36.9 C) (Oral)   Resp 20   Wt 63.6 kg   SpO2 94%   BMI 20.12 kg/m   General appearance: alert, cooperative, appears older than stated age, cachectic and no distress Head: Normocephalic, without obvious abnormality, atraumatic Neck: no adenopathy, no carotid bruit, no JVD, supple, symmetrical, trachea midline and thyroid not enlarged, symmetric, no tenderness/mass/nodules Lymph nodes:  Cervical, supraclavicular, and axillary nodes normal. Resp: clear to auscultation bilaterally Cardio: regular rate and rhythm, S1, S2 normal, no murmur, click, rub or gallop GI: soft, non-tender; bowel sounds normal; no masses,  no organomegaly Extremities: extremities normal, atraumatic, no cyanosis or edema and Homans sign is negative, no sign of DVT Neurologic: Grossly normal, patient appears alert and able to carry on a conversation without difficulty.  When seen previously several days ago he was very tremulous.   Diagnostic Studies & Laboratory data:   Dist RCA lesion is 90% stenosed.  Prox RCA lesion is 30% stenosed.  Prox RCA to Mid RCA lesion is 50% stenosed.  Mid RCA lesion is 50% stenosed.  Prox Cx lesion is 75% stenosed.  Mid Cx lesion is 70% stenosed.  Ost LAD lesion is 50% stenosed.  Prox LAD-1 lesion is 50% stenosed.  Prox LAD-2 lesion is 95% stenosed.  Mid LAD lesion is 80% stenosed.  LV end diastolic pressure is normal.  The left ventricular systolic function is normal.   Severe multivessel coronary obstructive disease with diffuse 50% proximal LAD stenoses followed by 95% stenosis before the takeoff of the first diagonal vessel with 80% stenosis beyond the diagonal vessel in the mid LAD; 75 and 70% proximal left circumflex stenoses; and diffuse irregularity of the proximal and mid RCA with a narrowings up to 50% with focal 90% acute margin stenosis in a dominant RCA.  Distal vessels are all very large.  Preserved global LV contractility with an ejection fraction of 55 to 60% but with a small region of mid inferior focal hypocontractility.  LVEDP is 15 mmHg.  RECOMMENDATION: In this patient with severe diffuse multivessel coronary obstructive disease and excellent distal targets, recommend surgical consultation for consideration of CABG revascularization.  Initiate high potency statin therapy with target LDL less than 80.  Smoking cessation is  imperative.  Recommend Aspirin 46m daily for moderate CAD.  Will not start DAPT in anticipation of CABG revascularization surgery.  Will resume heparin 10 hours post procedure.     Recent Radiology Findings:    Dg Chest 2 View  Result Date: 11/14/2017 CLINICAL DATA:  Chest pain and dyspnea x1 week EXAM: CHEST - 2 VIEW COMPARISON:  11/12/2017 FINDINGS: The heart size and mediastinal contours are within normal limits. Emphysematous hyperinflation of the lungs without pneumothorax, effusion or pulmonary consolidation. The visualized skeletal structures are unremarkable. IMPRESSION: Emphysematous hyperinflation of the lungs without acute cardiopulmonary abnormality. Electronically Signed   By: DAshley RoyaltyM.D.   On: 11/14/2017 23:57   [facility], no dissection, pancreas was of normal size and shape  I have independently reviewed the above radiologic studies and discussed with the patient   Recent Lab Findings: Lab Results  Component Value Date   WBC 8.7 11/16/2017   HGB 13.9 11/16/2017   HCT 42.7 11/16/2017   PLT 253 11/16/2017   GLUCOSE 99 11/15/2017   CHOL 204 (H) 11/14/2017   TRIG 76 11/14/2017   HDL 75 11/14/2017   LDLCALC 114 (H) 11/14/2017   ALT 45 (H) 11/13/2017   AST 51 (H) 11/13/2017   NA 140 11/15/2017   K 3.2 (L) 11/15/2017   CL 102 11/15/2017   CREATININE 1.00 11/15/2017   BUN 5 (L) 11/15/2017   CO2 26 11/15/2017   INR 0.98 11/13/2017      Assessment / Plan:      1. NSTEMI-currently with no chest pain.  He has had chest pain while in the hospital.  He is on heparin gtt and nitroglycerin ointment.  He also has as needed morphine.  Cardiac catheterization listed above.  Multivessel severe coronary artery disease.  Plan for CABG on Monday or Tuesday with Dr. GServando Snare 2.  GERD-continue Protonix 3.  Tobacco abuse-currently on a nicotine patch.  Counseled on cessation. 4.  Lung nodule-reported by patient based  [facility]  Recent chest x-ray here only shows COPD without acute abnormality.  [facility] 5.  Alcohol abuse-started on withdrawal protocol. 6.  The patient left the hospital several days ago leaving against our advice, and now returns  Patient appears to have high risk of possible acute alcohol withdrawal, he notes this occurred in the past.  If the patient comes on stable and develops DTs, cardiology will need to consider salvage LAD angioplasty to prevent occlusion of his LAD and large anterior infarct.  Plan: To the OR on Friday if remains neurologically intact and not subject to to withdrawal symptoms.. The procedure has been discussed in detail with the patient and family.  Liver enzymes are elevated, mildly, will check ammonia levels and ultrasound of the liver.  EGrace IsaacMD      [address] [place] [address]Office [phone]  [place]

## 2017-11-16 NOTE — Progress Notes (Signed)
Patient Demographics:    Greg [name], is a 47 y.o. male, DOB - 31-May-1970, TZG:[id number]  Admit date - 11/14/2017   Admitting Physician Norval Morton, MD  Outpatient Primary MD for the patient is Patient, No Pcp Per  LOS - 1   Chief Complaint  Patient presents with  . Chest Pain  . Shortness of Breath        Subjective:    Greg [name] today has no fevers, no emesis,  No chest pain, significant other at bedside slept well overnight  Assessment  & Plan :    Principal Problem:   Coronary artery disease Active Problems:   NSTEMI (non-ST elevated myocardial infarction) ([place])   Alcohol abuse   Hyperlipidemia   Tobacco abuse  Brief Summary 47 y.o.malerecently diagnosed with three-vessel CAD, ongoing tobacco abuse, alcohol abuse, COPDwho readmitted with chest pains on 11/15/2017.  Recently admitted on 11/12/2017 with NSTEMI, CABG advised by CT surgery, however patient left AMA on 11/14/2017.     Plan:- 1)NSTEMI/3 Vessel CAD--- d/w Dr Debara Pickett continue IV nitro IV heparin, avoid antiplatelet agents as patient may need CABG soon, d/w Lanelle Bal from CT surgery who we will try to determine timing of patient'sCABG surgery, remains chest pain-free at this time, continue Lipitor 40 mg daily,  metoprolol 12.5 mg twice daily  2)ETOH Abuse--- currently not in DTs, continue lorazepam per CIWA protocol, continue multivitamin/thiamine/folic acid  3)Tobacco Abuse--- per Dr Debara Pickett ok to continue nicotine patch  4) COPD--- stable without acute flareup at this time, PRN bronchodilators  Disposition/Need for in-Hospital Stay- patient unable to be discharged at this time due to NSTEMI in the patient with diffuse three-vessel CAD awaiting CABG surgery, patient requires IV heparin and IV nitro  Code Status : Full code   Disposition Plan  : TBD  Consults  :  CT surgery/cardiology  DVT Prophylaxis  : IV  heparin  Lab Results  Component Value Date   PLT 253 11/16/2017    Inpatient Medications  Scheduled Meds: . atorvastatin  40 mg Oral q1800  . LORazepam  0-4 mg Intravenous Q6H   Or  . LORazepam  0-4 mg Oral Q6H  . [START ON 11/17/2017] LORazepam  0-4 mg Intravenous Q12H   Or  . [START ON 11/17/2017] LORazepam  0-4 mg Oral Q12H  . nicotine  21 mg Transdermal Daily  . thiamine  100 mg Oral Daily   Or  . thiamine  100 mg Intravenous Daily   Continuous Infusions: . heparin 1,250 Units/hr (11/16/17 0100)  . nitroGLYCERIN 20 mcg/min (11/16/17 0100)   PRN Meds:.acetaminophen, gi cocktail, morphine injection, ondansetron (ZOFRAN) IV    Anti-infectives (From admission, onward)   None        Objective:   Vitals:   11/15/17 2354 11/16/17 0417 11/16/17 0500 11/16/17 0827  BP:  117/90 (!) 114/91 (!) 147/101  Pulse: 98 95 97 99  Resp: (!) 22 20    Temp: 98.1 F (36.7 C) 98.3 F (36.8 C)  98.2 F (36.8 C)  TempSrc:  Oral  Oral  SpO2: 95% 92%  92%  Weight:   63.6 kg     Wt Readings from Last 3 Encounters:  11/16/17 63.6 kg  11/12/17 65.8 kg  Intake/Output Summary (Last 24 hours) at 11/16/2017 1051 Last data filed at 11/16/2017 8546 Gross per 24 hour  Intake 493.58 ml  Output 1175 ml  Net -681.42 ml     Physical Exam Patient is examined daily including today on 11/16/17 , exams remain the same as of yesterday except that has changed   Gen:- Awake Alert,  In no apparent distress  HEENT:- [place].AT, No sclera icterus Neck-Supple Neck,No JVD,.  Lungs-  CTAB , good air movement CV- S1, S2 normal, regular Abd-  +ve B.Sounds, Abd Soft, No tenderness,    Extremity/Skin:- No  edema,   good pulses Psych-affect is appropriate, oriented x3 Neuro-no new focal deficits, no tremors   Data Review:   Micro Results Recent Results (from the past 240 hour(s))  Surgical pcr screen     Status: None   Collection Time: 11/15/17  3:22 AM  Result Value Ref Range Status    MRSA, PCR NEGATIVE NEGATIVE Final   Staphylococcus aureus NEGATIVE NEGATIVE Final    Comment: (NOTE) The Xpert SA Assay (FDA approved for NASAL specimens in patients 39 years of age and older), is one component of a comprehensive surveillance program. It is not intended to diagnose infection nor to guide or monitor treatment. Performed at [facility], [place] [address], [place],  [address]     Radiology Reports Dg Chest 2 View  Result Date: 11/14/2017 CLINICAL DATA:  Chest pain and dyspnea x1 week EXAM: CHEST - 2 VIEW COMPARISON:  11/12/2017 FINDINGS: The heart size and mediastinal contours are within normal limits. Emphysematous hyperinflation of the lungs without pneumothorax, effusion or pulmonary consolidation. The visualized skeletal structures are unremarkable. IMPRESSION: Emphysematous hyperinflation of the lungs without acute cardiopulmonary abnormality. Electronically Signed   By: Ashley Royalty M.D.   On: 11/14/2017 23:57   Dg Chest 2 View  Result Date: 11/12/2017 CLINICAL DATA:  Shortness of breath and chest pain EXAM: CHEST - 2 VIEW COMPARISON:  CT of the chest from earlier in the same day. FINDINGS: The heart size and mediastinal contours are within normal limits. Both lungs are hyperinflated. The visualized skeletal structures are unremarkable. IMPRESSION: COPD without acute abnormality. Electronically Signed   By: Inez Catalina M.D.   On: 11/12/2017 21:49     CBC Recent Labs  Lab 11/12/17 1639 11/13/17 0416 11/14/17 2304 11/15/17 0422 11/16/17 0312  WBC  --  7.5 10.1 9.2 8.7  HGB  --  15.0 17.4* 15.8 13.9  HCT 43.9 44.3 53.3* 47.8 42.7  PLT  --  272 309 280 253  MCV  --  100.5* 102.1* 102.1* 101.9*  MCH  --  34.0 33.3 33.8 33.2  MCHC  --  33.9 32.6 33.1 32.6  RDW  --  12.8 12.8 13.0 12.7  LYMPHSABS  --   --   --  1.7  --   MONOABS  --   --   --  1.2*  --   EOSABS  --   --   --  0.1  --   BASOSABS  --   --   --  0.1  --     Chemistries  Recent Labs    Lab 11/13/17 0416 11/14/17 2304 11/15/17 0422  NA 137 136 140  K 3.5 3.5 3.2*  CL 102 101 102  CO2 25 25 26   GLUCOSE 111* 72 99  BUN <5* 5* 5*  CREATININE 0.89 1.01 1.00  CALCIUM 8.6* 9.3 9.0  AST 51*  --   --  ALT 45*  --   --   ALKPHOS 67  --   --   BILITOT 1.1  --   --    ------------------------------------------------------------------------------------------------------------------ Recent Labs    11/14/17 2304  CHOL 204*  HDL 75  LDLCALC 114*  TRIG 76  CHOLHDL 2.7    No results found for: HGBA1C ------------------------------------------------------------------------------------------------------------------ No results for input(s): TSH, T4TOTAL, T3FREE, THYROIDAB in the last 72 hours.  Invalid input(s): FREET3 ------------------------------------------------------------------------------------------------------------------ No results for input(s): VITAMINB12, FOLATE, FERRITIN, TIBC, IRON, RETICCTPCT in the last 72 hours.  Coagulation profile Recent Labs  Lab 11/13/17 0416  INR 0.98    No results for input(s): DDIMER in the last 72 hours.  Cardiac Enzymes Recent Labs  Lab 11/15/17 0422 11/15/17 1044 11/15/17 1659  TROPONINI 0.06* 0.06* 0.05*   ------------------------------------------------------------------------------------------------------------------ No results found for: BNP   Roxan Hockey M.D on 11/16/2017 at 10:51 AM  Pager---[phone] Go to [email or web address] - password [email or web address] for contact info  [facility] - Office  [phone]

## 2017-11-16 NOTE — Progress Notes (Signed)
DAILY PROGRESS NOTE   Patient Name: Greg [name] Date of Encounter: 11/16/2017  Chief Complaint   No chest pain, hard time sleeping  Patient Profile   Greg [name] is a 47 y.o. male with a hx of CAD, tobacco abuse, alcohol abuse, COPD who is being seen today for the evaluation of CAD and chest pain at the request of Dr. [name]. Patient recently found to have 3V CAD, CABG recommended, but left AMA. Called and instructed to return back to the ER.  Subjective   No chest pain overnight, on IV Heparin and nitro gtts. Had ativan to help sleep.  Objective   Vitals:   11/15/17 2354 11/16/17 0417 11/16/17 0500 11/16/17 0827  BP:  117/90 (!) 114/91 (!) 147/101  Pulse: 98 95 97 99  Resp: (!) 22 20    Temp: 98.1 F (36.7 C) 98.3 F (36.8 C)  98.2 F (36.8 C)  TempSrc:  Oral  Oral  SpO2: 95% 92%  92%  Weight:   63.6 kg     Intake/Output Summary (Last 24 hours) at 11/16/2017 1022 Last data filed at 11/16/2017 0938 Gross per 24 hour  Intake 493.58 ml  Output 1175 ml  Net -681.42 ml   Filed Weights   11/16/17 0500  Weight: 63.6 kg    Physical Exam   General appearance: alert and no distress Neck: no carotid bruit, no JVD and thyroid not enlarged, symmetric, no tenderness/mass/nodules Lungs: clear to auscultation bilaterally Heart: regular rate and rhythm Abdomen: soft, non-tender; bowel sounds normal; no masses,  no organomegaly Extremities: extremities normal, atraumatic, no cyanosis or edema Pulses: 2+ and symmetric Skin: Skin color, texture, turgor normal. No rashes or lesions Neurologic: Grossly normal Psych: Pleasant  Inpatient Medications    Scheduled Meds: . atorvastatin  40 mg Oral q1800  . LORazepam  0-4 mg Intravenous Q6H   Or  . LORazepam  0-4 mg Oral Q6H  . [START ON 11/17/2017] LORazepam  0-4 mg Intravenous Q12H   Or  . [START ON 11/17/2017] LORazepam  0-4 mg Oral Q12H  . nicotine  21 mg Transdermal Daily  . thiamine  100 mg Oral Daily   Or  . thiamine  100 mg Intravenous Daily    Continuous Infusions: . heparin 1,250 Units/hr (11/16/17 0100)  . nitroGLYCERIN 20 mcg/min (11/16/17 0100)    PRN Meds: acetaminophen, gi cocktail, morphine injection, ondansetron (ZOFRAN) IV   Labs   Results for orders placed or performed during the hospital encounter of 11/14/17 (from the past 48 hour(s))  Basic metabolic panel     Status: Abnormal   Collection Time: 11/14/17 11:04 PM  Result Value Ref Range   Sodium 136 135 - 145 mmol/L   Potassium 3.5 3.5 - 5.1 mmol/L   Chloride 101 98 - 111 mmol/L   CO2 25 22 - 32 mmol/L   Glucose, Bld 72 70 - 99 mg/dL   BUN 5 (L) 6 - 20 mg/dL   Creatinine, Ser 1.01 0.61 - 1.24 mg/dL   Calcium 9.3 8.9 - 10.3 mg/dL   GFR calc non Af Amer >60 >60 mL/min   GFR calc Af Amer >60 >60 mL/min    Comment: (NOTE) The eGFR has been calculated using the CKD EPI equation. This calculation has not been validated in all clinical situations. eGFR's persistently <60 mL/min signify possible Chronic Kidney Disease.    Anion gap 10 5 - 15    Comment: Performed at [facility]  [address], [place], [place] [address]  CBC     Status: Abnormal   Collection Time: 11/14/17 11:04 PM  Result Value Ref Range   WBC 10.1 4.0 - 10.5 K/uL   RBC 5.22 4.22 - 5.81 MIL/uL   Hemoglobin 17.4 (H) 13.0 - 17.0 g/dL   HCT 53.3 (H) 39.0 - 52.0 %   MCV 102.1 (H) 80.0 - 100.0 fL   MCH 33.3 26.0 - 34.0 pg   MCHC 32.6 30.0 - 36.0 g/dL   RDW 12.8 11.5 - 15.5 %   Platelets 309 150 - 400 K/uL   nRBC 0.0 0.0 - 0.2 %    Comment: Performed at [facility], [place] [address], [place], [place] [address]  Lipid panel     Status: Abnormal   Collection Time: 11/14/17 11:04 PM  Result Value Ref Range   Cholesterol 204 (H) 0 - 200 mg/dL   Triglycerides 76 <150 mg/dL   HDL 75 >40 mg/dL   Total CHOL/HDL Ratio 2.7 RATIO   VLDL 15 0 - 40 mg/dL   LDL Cholesterol 114 (H) 0 - 99 mg/dL    Comment:        Total Cholesterol/HDL:CHD  Risk Coronary Heart Disease Risk Table                     Men   Women  1/2 Average Risk   3.4   3.3  Average Risk       5.0   4.4  2 X Average Risk   9.6   7.1  3 X Average Risk  23.4   11.0        Use the calculated Patient Ratio above and the CHD Risk Table to determine the patient's CHD Risk.        ATP III CLASSIFICATION (LDL):  <100     mg/dL   Optimal  100-129  mg/dL   Near or Above                    Optimal  130-159  mg/dL   Borderline  160-189  mg/dL   High  >190     mg/dL   Very High Performed at [facility] [address] [place],  [address]   I-stat troponin, ED     Status: None   Collection Time: 11/14/17 11:16 PM  Result Value Ref Range   Troponin i, poc 0.04 0.00 - 0.08 ng/mL   Comment 3            Comment: Due to the release kinetics of cTnI, a negative result within the first hours of the onset of symptoms does not rule out myocardial infarction with certainty. If myocardial infarction is still suspected, repeat the test at appropriate intervals.   Surgical pcr screen     Status: None   Collection Time: 11/15/17  3:22 AM  Result Value Ref Range   MRSA, PCR NEGATIVE NEGATIVE   Staphylococcus aureus NEGATIVE NEGATIVE    Comment: (NOTE) The Xpert SA Assay (FDA approved for NASAL specimens in patients 85 years of age and older), is one component of a comprehensive surveillance program. It is not intended to diagnose infection nor to guide or monitor treatment. Performed at [facility], [place] [address], [place], [place] [address]   Troponin I-serum (0, 3, 6 hours)     Status: Abnormal   Collection Time: 11/15/17  4:22 AM  Result Value Ref Range  Troponin I 0.06 (HH) <0.03 ng/mL    Comment: CRITICAL VALUE NOTED.  VALUE IS CONSISTENT WITH PREVIOUSLY REPORTED AND CALLED VALUE. Performed at [facility], [place] [address] [place], [place] [address]   Ethanol     Status: None   Collection Time: 11/15/17  4:22 AM  Result Value  Ref Range   Alcohol, Ethyl (B) <10 <10 mg/dL    Comment: (NOTE) Lowest detectable limit for serum alcohol is 10 mg/dL. For medical purposes only. Performed at [facility], [place] [address], [place], [place] [address]   CBC with Differential/Platelet     Status: Abnormal   Collection Time: 11/15/17  4:22 AM  Result Value Ref Range   WBC 9.2 4.0 - 10.5 K/uL   RBC 4.68 4.22 - 5.81 MIL/uL   Hemoglobin 15.8 13.0 - 17.0 g/dL   HCT 47.8 39.0 - 52.0 %   MCV 102.1 (H) 80.0 - 100.0 fL   MCH 33.8 26.0 - 34.0 pg   MCHC 33.1 30.0 - 36.0 g/dL   RDW 13.0 11.5 - 15.5 %   Platelets 280 150 - 400 K/uL   nRBC 0.0 0.0 - 0.2 %   Neutrophils Relative % 65 %   Neutro Abs 6.1 1.7 - 7.7 K/uL   Lymphocytes Relative 19 %   Lymphs Abs 1.7 0.7 - 4.0 K/uL   Monocytes Relative 13 %   Monocytes Absolute 1.2 (H) 0.1 - 1.0 K/uL   Eosinophils Relative 1 %   Eosinophils Absolute 0.1 0.0 - 0.5 K/uL   Basophils Relative 1 %   Basophils Absolute 0.1 0.0 - 0.1 K/uL   Immature Granulocytes 1 %   Abs Immature Granulocytes 0.05 0.00 - 0.07 K/uL    Comment: Performed at [facility] [address], [place],  [address]  Basic metabolic panel     Status: Abnormal   Collection Time: 11/15/17  4:22 AM  Result Value Ref Range   Sodium 140 135 - 145 mmol/L   Potassium 3.2 (L) 3.5 - 5.1 mmol/L   Chloride 102 98 - 111 mmol/L   CO2 26 22 - 32 mmol/L   Glucose, Bld 99 70 - 99 mg/dL   BUN 5 (L) 6 - 20 mg/dL   Creatinine, Ser 1.00 0.61 - 1.24 mg/dL   Calcium 9.0 8.9 - 10.3 mg/dL   GFR calc non Af Amer >60 >60 mL/min   GFR calc Af Amer >60 >60 mL/min    Comment: (NOTE) The eGFR has been calculated using the CKD EPI equation. This calculation has not been validated in all clinical situations. eGFR's persistently <60 mL/min signify possible Chronic Kidney Disease.    Anion gap 12 5 - 15    Comment: Performed at [facility] [address], [place], [place] [address]  Heparin level  (unfractionated)     Status: Abnormal   Collection Time: 11/15/17  8:47 AM  Result Value Ref Range   Heparin Unfractionated 0.25 (L) 0.30 - 0.70 IU/mL    Comment: (NOTE) If heparin results are below expected values, and patient dosage has  been confirmed, suggest follow up testing of antithrombin III levels. Performed at [facility], [place] [address] [place], [place] [address]   Troponin I-serum (0, 3, 6 hours)     Status: Abnormal   Collection Time: 11/15/17 10:44 AM  Result Value Ref Range   Troponin I 0.06 (HH) <0.03 ng/mL    Comment: CRITICAL VALUE NOTED.  VALUE  IS CONSISTENT WITH PREVIOUSLY REPORTED AND CALLED VALUE. Performed at [facility], [place] [address] [place], [place] [address]   Troponin I-serum (0, 3, 6 hours)     Status: Abnormal   Collection Time: 11/15/17  4:59 PM  Result Value Ref Range   Troponin I 0.05 (HH) <0.03 ng/mL    Comment: CRITICAL VALUE NOTED.  VALUE IS CONSISTENT WITH PREVIOUSLY REPORTED AND CALLED VALUE. Performed at [facility], [place] [address], [place], [place] [address]   Heparin level (unfractionated)     Status: None   Collection Time: 11/15/17  6:39 PM  Result Value Ref Range   Heparin Unfractionated 0.44 0.30 - 0.70 IU/mL    Comment: (NOTE) If heparin results are below expected values, and patient dosage has  been confirmed, suggest follow up testing of antithrombin III levels. Performed at [facility], [place] [address] [place], [place] [address]   Heparin level (unfractionated)     Status: None   Collection Time: 11/16/17  3:12 AM  Result Value Ref Range   Heparin Unfractionated 0.43 0.30 - 0.70 IU/mL    Comment: (NOTE) If heparin results are below expected values, and patient dosage has  been confirmed, suggest follow up testing of antithrombin III levels. Performed at [facility], [place] [address] [place], [place] [address]   CBC     Status: Abnormal   Collection Time: 11/16/17  3:12 AM  Result Value  Ref Range   WBC 8.7 4.0 - 10.5 K/uL   RBC 4.19 (L) 4.22 - 5.81 MIL/uL   Hemoglobin 13.9 13.0 - 17.0 g/dL   HCT 42.7 39.0 - 52.0 %   MCV 101.9 (H) 80.0 - 100.0 fL   MCH 33.2 26.0 - 34.0 pg   MCHC 32.6 30.0 - 36.0 g/dL   RDW 12.7 11.5 - 15.5 %   Platelets 253 150 - 400 K/uL   nRBC 0.0 0.0 - 0.2 %    Comment: Performed at [facility], [place] [address] [place], [place] [address]    ECG   N/A  Telemetry   Sinus rhythm - Personally Reviewed  Radiology    Dg Chest 2 View  Result Date: 11/14/2017 CLINICAL DATA:  Chest pain and dyspnea x1 week EXAM: CHEST - 2 VIEW COMPARISON:  11/12/2017 FINDINGS: The heart size and mediastinal contours are within normal limits. Emphysematous hyperinflation of the lungs without pneumothorax, effusion or pulmonary consolidation. The visualized skeletal structures are unremarkable. IMPRESSION: Emphysematous hyperinflation of the lungs without acute cardiopulmonary abnormality. Electronically Signed   By: Ashley Royalty M.D.   On: 11/14/2017 23:57    Cardiac Studies   N/A  Assessment   1. Principal Problem: 2.   Coronary artery disease 3. Active Problems: 4.   NSTEMI (non-ST elevated myocardial infarction) ([place]) 5.   Alcohol abuse 6.   Hyperlipidemia 7.   Tobacco abuse 8.   Plan   1. Known 3V CAD - chest pain free currently on nitroglycerin Gtts and heparin. Seen by Servando Snare last week who recommended CABG - left AMA. Will need to notify CT surgery of his return. Continue current treatments.  Time Spent Directly with Patient:  I have spent a total of 15 minutes with the patient reviewing hospital notes, telemetry, EKGs, labs and examining the patient as well as establishing an assessment and plan that was discussed personally with the patient.  > 50% of time was spent in direct patient care.  Length of Stay:  LOS: 1 day   Pixie Casino, MD, FACC, [place] Director of the [facility] Diplomate of the American Board of Clinical Lipidology Attending Cardiologist  Direct Dial: [phone]  Fax: [phone]  Website:  [email or web address] 11/16/2017, 10:22 AM

## 2017-11-16 NOTE — Progress Notes (Signed)
ANTICOAGULATION CONSULT NOTE   Pharmacy Consult for Heparin  Indication: chest pain/ACS, recent cath, severe multi-vessel disease  No Known Allergies  Vital Signs: Temp: 98.2 F (36.8 C) (10/13 0827) Temp Source: Oral (10/13 0827) BP: 147/101 (10/13 0827) Pulse Rate: 99 (10/13 0827)  Labs: Recent Labs    11/14/17 2304 11/15/17 0422 11/15/17 0847 11/15/17 1044 11/15/17 1659 11/15/17 1839 11/16/17 0312  HGB 17.4* 15.8  --   --   --   --  13.9  HCT 53.3* 47.8  --   --   --   --  42.7  PLT 309 280  --   --   --   --  253  HEPARINUNFRC  --   --  0.25*  --   --  0.44 0.43  CREATININE 1.01 1.00  --   --   --   --   --   TROPONINI  --  0.06*  --  0.06* 0.05*  --   --     Estimated Creatinine Clearance: 80.4 mL/min (by C-G formula based on SCr of 1 mg/dL).  Medical History: Past Medical History:  Diagnosis Date  . COPD (chronic obstructive pulmonary disease) ([place])   . Daily headache   . GERD (gastroesophageal reflux disease)   . Skin cancer    "cut off my eyelid and my neck" (11/12/2017)    Assessment:  52 yoM with recent cath on 10/10 that revealed multi-vessel disease, planning for CABG. Patient then left AMA, now back with CP.   Heparin level remains therapeutic: 0.43, CBC WNL but down slightly, likely dilutional, no bleeding noted.  Goal of Therapy:  Heparin level 0.3-0.7 units/ml Monitor platelets by anticoagulation protocol: Yes   Plan:  Continue heparin gtt at 1250 units/hr Daily CBC and heparin level Monitor for s/sx of bleeding F/u CABG plans  Georga Bora, PharmD Clinical Pharmacist 11/16/2017 9:35 AM Please check AMION for all [place] numbers

## 2017-11-17 DIAGNOSIS — E7800 Pure hypercholesterolemia, unspecified: Secondary | ICD-10-CM

## 2017-11-17 LAB — GLUCOSE, CAPILLARY: GLUCOSE-CAPILLARY: 105 mg/dL — AB (ref 70–99)

## 2017-11-17 LAB — COMPREHENSIVE METABOLIC PANEL
ALT: 48 U/L — ABNORMAL HIGH (ref 0–44)
ANION GAP: 11 (ref 5–15)
AST: 53 U/L — ABNORMAL HIGH (ref 15–41)
Albumin: 2.7 g/dL — ABNORMAL LOW (ref 3.5–5.0)
Alkaline Phosphatase: 68 U/L (ref 38–126)
BUN: <5 mg/dL — ABNORMAL LOW (ref 6–20)
CHLORIDE: 104 mmol/L (ref 98–111)
CO2: 25 mmol/L (ref 22–32)
Calcium: 8.9 mg/dL (ref 8.9–10.3)
Creatinine, Ser: 0.84 mg/dL (ref 0.61–1.24)
Glucose, Bld: 88 mg/dL (ref 70–99)
Potassium: 3.5 mmol/L (ref 3.5–5.1)
Sodium: 140 mmol/L (ref 135–145)
TOTAL PROTEIN: 5.8 g/dL — AB (ref 6.5–8.1)
Total Bilirubin: 0.5 mg/dL (ref 0.3–1.2)

## 2017-11-17 LAB — PROTIME-INR
INR: 0.94
Prothrombin Time: 12.5 seconds (ref 11.4–15.2)

## 2017-11-17 LAB — CBC
HCT: 42.3 % (ref 39.0–52.0)
Hemoglobin: 13.8 g/dL (ref 13.0–17.0)
MCH: 33 pg (ref 26.0–34.0)
MCHC: 32.6 g/dL (ref 30.0–36.0)
MCV: 101.2 fL — AB (ref 80.0–100.0)
PLATELETS: 263 10*3/uL (ref 150–400)
RBC: 4.18 MIL/uL — AB (ref 4.22–5.81)
RDW: 12.7 % (ref 11.5–15.5)
WBC: 8 10*3/uL (ref 4.0–10.5)
nRBC: 0 % (ref 0.0–0.2)

## 2017-11-17 LAB — AMMONIA: Ammonia: 41 umol/L — ABNORMAL HIGH (ref 9–35)

## 2017-11-17 LAB — HEPARIN LEVEL (UNFRACTIONATED): Heparin Unfractionated: 0.48 IU/mL (ref 0.30–0.70)

## 2017-11-17 MED ORDER — PNEUMOCOCCAL VAC POLYVALENT 25 MCG/0.5ML IJ INJ: 0.5000 mL | INJECTION | INTRAMUSCULAR | Status: DC | PRN

## 2017-11-17 MED ORDER — ASPIRIN EC 81 MG PO TBEC
81.0000 mg | DELAYED_RELEASE_TABLET | Freq: Every day | ORAL | Status: DC
  Administered 2017-11-17 – 2017-11-20 (×4): 81 mg via ORAL
  Filled 2017-11-17 (×4): qty 1

## 2017-11-17 MED ORDER — INFLUENZA VAC SPLIT QUAD 0.5 ML IM SUSY: 0.5000 mL | PREFILLED_SYRINGE | INTRAMUSCULAR | Status: DC | PRN

## 2017-11-17 NOTE — Progress Notes (Addendum)
Progress Note  Patient Name: Greg [name] Date of Encounter: 11/17/2017  Primary Cardiologist: Dr. Harrington Challenger  Subjective   Feeling well. No chest pain, sob or palpitations.   Inpatient Medications    Scheduled Meds: . atorvastatin  40 mg Oral q1800  . folic acid  1 mg Oral Daily  . LORazepam  0-4 mg Intravenous Q12H   Or  . LORazepam  0-4 mg Oral Q12H  . metoprolol tartrate  12.5 mg Oral BID  . nicotine  21 mg Transdermal Daily  . thiamine  100 mg Oral Daily   Or  . thiamine  100 mg Intravenous Daily   Continuous Infusions: . heparin 1,250 Units/hr (11/17/17 0800)  . nitroGLYCERIN 20 mcg/min (11/17/17 0800)   PRN Meds: albuterol, gi cocktail, morphine injection, ondansetron (ZOFRAN) IV   Vital Signs    Vitals:   11/16/17 2135 11/17/17 0019 11/17/17 0427 11/17/17 0600  BP: (!) 135/92 114/83 128/85 125/83  Pulse: (!) 110 82 96 92  Resp:  18 18   Temp:  98.3 F (36.8 C) 98.2 F (36.8 C)   TempSrc:  Oral Oral   SpO2:  91% 93%   Weight:        Intake/Output Summary (Last 24 hours) at 11/17/2017 0933 Last data filed at 11/17/2017 0800 Gross per 24 hour  Intake 813.41 ml  Output -  Net 813.41 ml   Filed Weights   11/16/17 0500  Weight: 63.6 kg    Telemetry    SR - Personally Reviewed  ECG   N/A  Physical Exam   GEN: No acute distress.   Neck: No JVD Cardiac: RRR, no murmurs, rubs, or gallops.  Respiratory: Clear to auscultation bilaterally. GI: Soft, nontender, non-distended  MS: No edema; No deformity. Neuro:  Nonfocal  Psych: Normal affect   Labs    Chemistry Recent Labs  Lab 11/13/17 0416 11/14/17 2304 11/15/17 0422 11/17/17 0340  NA 137 136 140 140  K 3.5 3.5 3.2* 3.5  CL 102 101 102 104  CO2 25 25 26 25   GLUCOSE 111* 72 99 88  BUN <5* 5* 5* <5*  CREATININE 0.89 1.01 1.00 0.84  CALCIUM 8.6* 9.3 9.0 8.9  PROT 5.8*  --   --  5.8*  ALBUMIN 2.8*  --   --  2.7*  AST 51*  --   --  53*  ALT 45*  --   --  48*  ALKPHOS 67  --    --  68  BILITOT 1.1  --   --  0.5  GFRNONAA >60 >60 >60 >60  GFRAA >60 >60 >60 >60  ANIONGAP 10 10 12 11      Hematology Recent Labs  Lab 11/15/17 0422 11/16/17 0312 11/17/17 0340  WBC 9.2 8.7 8.0  RBC 4.68 4.19* 4.18*  HGB 15.8 13.9 13.8  HCT 47.8 42.7 42.3  MCV 102.1* 101.9* 101.2*  MCH 33.8 33.2 33.0  MCHC 33.1 32.6 32.6  RDW 13.0 12.7 12.7  PLT 280 253 263    Cardiac Enzymes Recent Labs  Lab 11/13/17 0416 11/15/17 0422 11/15/17 1044 11/15/17 1659  TROPONINI 0.13* 0.06* 0.06* 0.05*    Recent Labs  Lab 11/14/17 2316  TROPIPOC 0.04     Radiology    US Abdomen Complete  Result Date: 11/16/2017 CLINICAL DATA:  Elevated liver function tests. Chronic abdominal pain. EXAM: ABDOMEN ULTRASOUND COMPLETE COMPARISON:  CT on 11/12/2017 FINDINGS: Gallbladder: No gallstones or wall thickening visualized. No sonographic Murphy sign noted by  sonographer. Common bile duct: Diameter: 5 mm, within normal limits. Liver: Diffusely increased echogenicity of the hepatic parenchyma, consistent with hepatic steatosis. No focal mass lesion identified. Portal vein is patent on color Doppler imaging with normal direction of blood flow towards the liver. IVC: No abnormality visualized. Pancreas: Visualized portion unremarkable. Spleen: Size and appearance within normal limits. Right Kidney: Length: 10.0 cm. Echogenicity within normal limits. No mass or hydronephrosis visualized. Left Kidney: Length: 10.1 cm. Echogenicity within normal limits. No mass or hydronephrosis visualized. Abdominal aorta: No aneurysm visualized. Other findings: None. IMPRESSION: Diffuse hepatic steatosis. No evidence of cholelithiasis, biliary ductal dilatation, or other significant abnormality. Electronically Signed   By: Earle Gell M.D.   On: 11/16/2017 20:59    Cardiac Studies   LEFT HEART CATH AND CORONARY ANGIOGRAPHY  11/13/17  Conclusion     Dist RCA lesion is 90% stenosed.  Prox RCA lesion is 30%  stenosed.  Prox RCA to Mid RCA lesion is 50% stenosed.  Mid RCA lesion is 50% stenosed.  Prox Cx lesion is 75% stenosed.  Mid Cx lesion is 70% stenosed.  Ost LAD lesion is 50% stenosed.  Prox LAD-1 lesion is 50% stenosed.  Prox LAD-2 lesion is 95% stenosed.  Mid LAD lesion is 80% stenosed.  LV end diastolic pressure is normal.  The left ventricular systolic function is normal.   Severe multivessel coronary obstructive disease with diffuse 50% proximal LAD stenoses followed by 95% stenosis before the takeoff of the first diagonal vessel with 80% stenosis beyond the diagonal vessel in the mid LAD; 75 and 70% proximal left circumflex stenoses; and diffuse irregularity of the proximal and mid RCA with a narrowings up to 50% with focal 90% acute margin stenosis in a dominant RCA.  Distal vessels are all very large.  Preserved global LV contractility with an ejection fraction of 55 to 60% but with a small region of mid inferior focal hypocontractility.  LVEDP is 15 mmHg.  RECOMMENDATION: In this patient with severe diffuse multivessel coronary obstructive disease and excellent distal targets, recommend surgical consultation for consideration of CABG revascularization.  Initiate high potency statin therapy with target LDL less than 80.  Smoking cessation is imperative.  Recommend Aspirin 45m daily for moderate CAD.  Will not start DAPT in anticipation of CABG revascularization surgery.  Will resume heparin 10 hours post procedure.    Echo 11/13/17 Study Conclusions  - Left ventricle: The cavity size was normal. Wall thickness was   normal. Systolic function was normal. The estimated ejection   fraction was in the range of 60% to 65%. Wall motion was normal;   there were no regional wall motion abnormalities. Doppler   parameters are consistent with abnormal left ventricular   relaxation (grade 1 diastolic dysfunction). The E/e&' ratio is <8,   suggesting normal LV filling  pressure. - Left atrium: The atrium was normal in size. - Inferior vena cava: The vessel was normal in size. The   respirophasic diameter changes were in the normal range (>= 50%),   consistent with normal central venous pressure.  Impressions:  - LVEF 60-65%, normal wall thickness, normal wall motion, grade 1   DD, normal LV filling pressure, normal LA size, normal IVC.  Patient Profile     Greg [name] a 47y.o.malewith a hx of CAD, tobacco abuse, alcohol abuse, COPDwho is being seen  for the evaluation of CAD and chest painat the request of Dr. STamala Julian Patient recently found to have 3V CAD, CABG  recommended, but left AMA. Called and instructed to return back to the ER.  Assessment & Plan    1. Multivessel CAD - Left AMA but now come back. Plan for surgery on Friday as long as not DTs.  Troponin flat at 0.06 x 2>> 0.05. Continue ASA, Statin, BB and IV heparin and nitro gtt. Remained chest pain free.   2. Alcohol abuse - CIWA protocol  3. Tobacco abuse - On nicotine patch.   For questions or updates, please contact [place] Please consult [email or web address] for contact info under        Signed, Leanor Kail, PA  11/17/2017, 9:33 AM    Agree with note by Robbie Lis PA-C  Greg [name] apparently left AMA on Thursday and return the following day.  He has three-vessel disease by cath performed Dr. Claiborne Billings.  His other problems include COPD and alcohol abuse.  He is currently on IV heparin and nitroglycerin and is scheduled to have bypass surgery on Friday.  His exam is benign.  He is on DT/withdrawal prophylaxis.  Lorretta Harp, M.D., [place], [facility], [place] [address] [place], [place]  [address]  [phone] 11/17/2017 10:14 AM

## 2017-11-17 NOTE — Progress Notes (Signed)
Nutrition Brief Note  Patient identified on the Malnutrition Screening Tool (MST) Report. Patient reports no recent weight loss, good appetite, and good intake of meals. Nutrition focused physical exam completed.  No muscle or subcutaneous fat depletion noticed.  Wt Readings from Last 15 Encounters:  11/16/17 63.6 kg  11/12/17 65.8 kg    Body mass index is 20.12 kg/m. Patient meets criteria for normal weight based on current BMI.   Current diet order is heart healthy, patient is consuming approximately 100% of meals at this time. Labs and medications reviewed.   No nutrition interventions warranted at this time. If nutrition issues arise, please consult RD.   Molli Barrows, RD, LDN, [place] Pager [phone] After Hours Pager [phone]

## 2017-11-17 NOTE — Progress Notes (Signed)
Discussed sternal precautions, IS (1750 mL), mobility post op, and d/c planning with pt and family. Pt is quite anxious and sometimes tangential of situations that frustrate him. He has been having angina at times (right shoulder to chest to back). Calmed pt with information, encouraged him to work on IS and read OHS booklet, careguide, and watch video. Family st they will be with him at d/c. Apparently he has significant stress at home right now as well but he did not elaborate. He will benefit from walking hall for anxiety if he does not have angina. Will not follow until after surgery.  [place], ACSM 2:42 PM 11/17/2017

## 2017-11-17 NOTE — Progress Notes (Signed)
[place] for Heparin  Indication: chest pain/ACS, recent cath, severe multi-vessel disease  No Known Allergies  Vital Signs: Temp: 98.2 F (36.8 C) (10/14 0427) Temp Source: Oral (10/14 0427) BP: 125/83 (10/14 0600) Pulse Rate: 92 (10/14 0600)  Labs: Recent Labs    11/14/17 2304 11/15/17 0422  11/15/17 1044 11/15/17 1659 11/15/17 1839 11/16/17 0312 11/17/17 0340  HGB 17.4* 15.8  --   --   --   --  13.9 13.8  HCT 53.3* 47.8  --   --   --   --  42.7 42.3  PLT 309 280  --   --   --   --  253 263  LABPROT  --   --   --   --   --   --   --  12.5  INR  --   --   --   --   --   --   --  0.94  HEPARINUNFRC  --   --    < >  --   --  0.44 0.43 0.48  CREATININE 1.01 1.00  --   --   --   --   --  0.84  TROPONINI  --  0.06*  --  0.06* 0.05*  --   --   --    < > = values in this interval not displayed.    Estimated Creatinine Clearance: 95.7 mL/min (by C-G formula based on SCr of 0.84 mg/dL).  Medical History: Past Medical History:  Diagnosis Date  . COPD (chronic obstructive pulmonary disease) ([place])   . Daily headache   . GERD (gastroesophageal reflux disease)   . Skin cancer    "cut off my eyelid and my neck" (11/12/2017)    Assessment:  51 yoM with recent cath on 10/10 that revealed multi-vessel disease, planning for CABG.  Heparin level remains therapeutic: 0.48, CBC stable but down slightly from admission, likely dilutional, no bleeding noted.  Goal of Therapy:  Heparin level 0.3-0.7 units/ml Monitor platelets by anticoagulation protocol: Yes   Plan:  Continue heparin gtt at 1250 units/hr Daily CBC and heparin level Monitor for s/sx of bleeding F/u CABG plans  Rober Minion, PharmD., MS Clinical Pharmacist Pager:  [phone] Thank you for allowing pharmacy to be part of this patients care team. 11/17/2017 8:34 AM Please check AMION for all [place] numbers

## 2017-11-17 NOTE — Progress Notes (Signed)
Patient Demographics:    Greg [name], is a 47 y.o. male, DOB - 1970-11-22, BWG:[id number]  Admit date - 11/14/2017   Admitting Physician Norval Morton, MD  Outpatient Primary MD for the patient is Patient, No Pcp Per  LOS - 2   Chief Complaint  Patient presents with  . Chest Pain  . Shortness of Breath        Subjective:    Greg [name] today has  [name] today has no new complaints, eating and drinking okay, no DT type symptoms, no chest pains or shortness of breath,  Assessment  & Plan :    Principal Problem:   Coronary artery disease Active Problems:   NSTEMI (non-ST elevated myocardial infarction) ([place])   Alcohol abuse   Hyperlipidemia   Tobacco abuse  Brief Summary 47 y.o.malerecently diagnosed with three-vessel CAD, ongoing tobacco abuse, alcohol abuse, COPDwho readmitted with chest pains on 11/15/2017.  Recently admitted on 11/12/2017 with NSTEMI, CABG advised by CT surgery, however patient left AMA on 11/14/2017, readmitted 11/15/2017 awaiting CABG on Friday 11/21/17.     Plan:- 1)NSTEMI/3 Vessel CAD--- d/w Dr Debara Pickett and Dr Gwenlyn Found ok continue IV nitro IV heparin until CABG, avoid Plavix as patient may need CABG soon, aspirin 81 mg daily is okay , d/w Lanelle Bal from CT surgery   remains chest pain-free at this time, continue Lipitor 40 mg daily,  metoprolol 12.5 mg twice daily, LDL is 114,  HDL is 75 (high HDL due to alcohol abuse and possible alcoholic liver disease)  2)ETOH Abuse--- currently not in DTs, continue lorazepam per CIWA protocol, continue multivitamin/thiamine/folic acid, MCV is 177.9, liver ultrasound with Diffuse hepatic steatosis  3)Tobacco Abuse--- per Dr Debara Pickett ok to continue nicotine patch  4) COPD--- stable without acute flareup at this time, PRN bronchodilators  Disposition/Need for in-Hospital Stay- patient unable to be discharged at this time  due to NSTEMI in the patient with diffuse three-vessel CAD awaiting CABG surgery, patient requires IV heparin and IV nitro  Code Status : Full code   Disposition Plan  : TBD  Consults  :  CT surgery/cardiology  DVT Prophylaxis  : IV heparin  Lab Results  Component Value Date   PLT 263 11/17/2017    Inpatient Medications  Scheduled Meds: . aspirin EC  81 mg Oral Daily  . atorvastatin  40 mg Oral q1800  . folic acid  1 mg Oral Daily  . LORazepam  0-4 mg Intravenous Q12H   Or  . LORazepam  0-4 mg Oral Q12H  . metoprolol tartrate  12.5 mg Oral BID  . nicotine  21 mg Transdermal Daily  . thiamine  100 mg Oral Daily   Or  . thiamine  100 mg Intravenous Daily   Continuous Infusions: . heparin 1,250 Units/hr (11/17/17 0800)  . nitroGLYCERIN 20 mcg/min (11/17/17 0800)   PRN Meds:.albuterol, gi cocktail, morphine injection, ondansetron (ZOFRAN) IV    Anti-infectives (From admission, onward)   None        Objective:   Vitals:   11/17/17 0019 11/17/17 0427 11/17/17 0600 11/17/17 1000  BP: 114/83 128/85 125/83 (!) 128/91  Pulse: 82 96 92 91  Resp: 18 18  18   Temp: 98.3 F (36.8 C) 98.2 F (  36.8 C)  97.9 F (36.6 C)  TempSrc: Oral Oral  Oral  SpO2: 91% 93%  93%  Weight:        Wt Readings from Last 3 Encounters:  11/16/17 63.6 kg  11/12/17 65.8 kg     Intake/Output Summary (Last 24 hours) at 11/17/2017 1025 Last data filed at 11/17/2017 0800 Gross per 24 hour  Intake 813.41 ml  Output -  Net 813.41 ml     Physical Exam Patient is examined daily including today on 11/17/17 , exams remain the same as of yesterday except that has changed   Gen:- Awake Alert,  In no apparent distress  HEENT:- [place].AT, No sclera icterus Neck-Supple Neck,No JVD,.  Lungs-  CTAB , good air movement CV- S1, S2 normal, regular Abd-  +ve B.Sounds, Abd Soft, No tenderness,    Extremity/Skin:- No  edema,   good pulses Psych-affect is appropriate, oriented x3 Neuro-no new focal  deficits, no tremors   Data Review:   Micro Results Recent Results (from the past 240 hour(s))  Surgical pcr screen     Status: None   Collection Time: 11/15/17  3:22 AM  Result Value Ref Range Status   MRSA, PCR NEGATIVE NEGATIVE Final   Staphylococcus aureus NEGATIVE NEGATIVE Final    Comment: (NOTE) The Xpert SA Assay (FDA approved for NASAL specimens in patients 42 years of age and older), is one component of a comprehensive surveillance program. It is not intended to diagnose infection nor to guide or monitor treatment. Performed at [facility], [place] [address], [place], [place] [address]     Radiology Reports Dg Chest 2 View  Result Date: 11/14/2017 CLINICAL DATA:  Chest pain and dyspnea x1 week EXAM: CHEST - 2 VIEW COMPARISON:  11/12/2017 FINDINGS: The heart size and mediastinal contours are within normal limits. Emphysematous hyperinflation of the lungs without pneumothorax, effusion or pulmonary consolidation. The visualized skeletal structures are unremarkable. IMPRESSION: Emphysematous hyperinflation of the lungs without acute cardiopulmonary abnormality. Electronically Signed   By: Ashley Royalty M.D.   On: 11/14/2017 23:57   Dg Chest 2 View  Result Date: 11/12/2017 CLINICAL DATA:  Shortness of breath and chest pain EXAM: CHEST - 2 VIEW COMPARISON:  CT of the chest from earlier in the same day. FINDINGS: The heart size and mediastinal contours are within normal limits. Both lungs are hyperinflated. The visualized skeletal structures are unremarkable. IMPRESSION: COPD without acute abnormality. Electronically Signed   By: Inez Catalina M.D.   On: 11/12/2017 21:49   US Abdomen Complete  Result Date: 11/16/2017 CLINICAL DATA:  Elevated liver function tests. Chronic abdominal pain. EXAM: ABDOMEN ULTRASOUND COMPLETE COMPARISON:  CT on 11/12/2017 FINDINGS: Gallbladder: No gallstones or wall thickening visualized. No sonographic Murphy sign noted by sonographer. Common bile  duct: Diameter: 5 mm, within normal limits. Liver: Diffusely increased echogenicity of the hepatic parenchyma, consistent with hepatic steatosis. No focal mass lesion identified. Portal vein is patent on color Doppler imaging with normal direction of blood flow towards the liver. IVC: No abnormality visualized. Pancreas: Visualized portion unremarkable. Spleen: Size and appearance within normal limits. Right Kidney: Length: 10.0 cm. Echogenicity within normal limits. No mass or hydronephrosis visualized. Left Kidney: Length: 10.1 cm. Echogenicity within normal limits. No mass or hydronephrosis visualized. Abdominal aorta: No aneurysm visualized. Other findings: None. IMPRESSION: Diffuse hepatic steatosis. No evidence of cholelithiasis, biliary ductal dilatation, or other significant abnormality. Electronically Signed   By: Earle Gell M.D.   On: 11/16/2017 20:59  CBC Recent Labs  Lab 11/13/17 0416 11/14/17 2304 11/15/17 0422 11/16/17 0312 11/17/17 0340  WBC 7.5 10.1 9.2 8.7 8.0  HGB 15.0 17.4* 15.8 13.9 13.8  HCT 44.3 53.3* 47.8 42.7 42.3  PLT 272 309 280 253 263  MCV 100.5* 102.1* 102.1* 101.9* 101.2*  MCH 34.0 33.3 33.8 33.2 33.0  MCHC 33.9 32.6 33.1 32.6 32.6  RDW 12.8 12.8 13.0 12.7 12.7  LYMPHSABS  --   --  1.7  --   --   MONOABS  --   --  1.2*  --   --   EOSABS  --   --  0.1  --   --   BASOSABS  --   --  0.1  --   --     Chemistries  Recent Labs  Lab 11/13/17 0416 11/14/17 2304 11/15/17 0422 11/17/17 0340  NA 137 136 140 140  K 3.5 3.5 3.2* 3.5  CL 102 101 102 104  CO2 25 25 26 25   GLUCOSE 111* 72 99 88  BUN <5* 5* 5* <5*  CREATININE 0.89 1.01 1.00 0.84  CALCIUM 8.6* 9.3 9.0 8.9  AST 51*  --   --  53*  ALT 45*  --   --  48*  ALKPHOS 67  --   --  68  BILITOT 1.1  --   --  0.5   ------------------------------------------------------------------------------------------------------------------ Recent Labs    11/14/17 2304  CHOL 204*  HDL 75  LDLCALC 114*  TRIG  76  CHOLHDL 2.7    No results found for: HGBA1C ------------------------------------------------------------------------------------------------------------------ No results for input(s): TSH, T4TOTAL, T3FREE, THYROIDAB in the last 72 hours.  Invalid input(s): FREET3 ------------------------------------------------------------------------------------------------------------------ No results for input(s): VITAMINB12, FOLATE, FERRITIN, TIBC, IRON, RETICCTPCT in the last 72 hours.  Coagulation profile Recent Labs  Lab 11/13/17 0416 11/17/17 0340  INR 0.98 0.94    No results for input(s): DDIMER in the last 72 hours.  Cardiac Enzymes Recent Labs  Lab 11/15/17 0422 11/15/17 1044 11/15/17 1659  TROPONINI 0.06* 0.06* 0.05*   ------------------------------------------------------------------------------------------------------------------ No results found for: BNP  Roxan Hockey M.D on 11/17/2017 at 10:25 AM  Pager---[phone] Go to [email or web address] - password [email or web address] for contact info  [facility] - Office  [phone]

## 2017-11-18 DIAGNOSIS — E7801 Familial hypercholesterolemia: Secondary | ICD-10-CM

## 2017-11-18 LAB — CBC
HCT: 43.8 % (ref 39.0–52.0)
Hemoglobin: 14.4 g/dL (ref 13.0–17.0)
MCH: 33.6 pg (ref 26.0–34.0)
MCHC: 32.9 g/dL (ref 30.0–36.0)
MCV: 102.1 fL — ABNORMAL HIGH (ref 80.0–100.0)
PLATELETS: 272 10*3/uL (ref 150–400)
RBC: 4.29 MIL/uL (ref 4.22–5.81)
RDW: 12.9 % (ref 11.5–15.5)
WBC: 8.8 10*3/uL (ref 4.0–10.5)
nRBC: 0 % (ref 0.0–0.2)

## 2017-11-18 LAB — URINALYSIS, ROUTINE W REFLEX MICROSCOPIC
Bilirubin Urine: NEGATIVE
Glucose, UA: NEGATIVE mg/dL
Hgb urine dipstick: NEGATIVE
Ketones, ur: NEGATIVE mg/dL
LEUKOCYTES UA: NEGATIVE
Nitrite: NEGATIVE
PROTEIN: NEGATIVE mg/dL
Specific Gravity, Urine: 1.012 (ref 1.005–1.030)
pH: 7 (ref 5.0–8.0)

## 2017-11-18 LAB — HEPARIN LEVEL (UNFRACTIONATED): HEPARIN UNFRACTIONATED: 0.54 [IU]/mL (ref 0.30–0.70)

## 2017-11-18 MED ORDER — ENSURE ENLIVE PO LIQD: 237.0000 mL | Freq: Two times a day (BID) | ORAL | Status: DC

## 2017-11-18 NOTE — Progress Notes (Addendum)
Progress Note  Patient Name: Greg [name] Date of Encounter: 11/18/2017  Primary Cardiologist: No primary care provider on file.  Subjective   No chest pain. Laying comfortably in bed.   Inpatient Medications    Scheduled Meds: . aspirin EC  81 mg Oral Daily  . atorvastatin  40 mg Oral q1800  . folic acid  1 mg Oral Daily  . LORazepam  0-4 mg Intravenous Q12H   Or  . LORazepam  0-4 mg Oral Q12H  . metoprolol tartrate  12.5 mg Oral BID  . nicotine  21 mg Transdermal Daily  . thiamine  100 mg Oral Daily   Or  . thiamine  100 mg Intravenous Daily   Continuous Infusions: . heparin 1,250 Units/hr (11/18/17 0615)  . nitroGLYCERIN 20 mcg/min (11/18/17 0615)   PRN Meds: albuterol, gi cocktail, Influenza vac split quadrivalent PF, morphine injection, ondansetron (ZOFRAN) IV, pneumococcal 23 valent vaccine   Vital Signs    Vitals:   11/17/17 2002 11/17/17 2327 11/18/17 0009 11/18/17 0606  BP: (!) 127/100 (!) 111/91 (!) 138/96 (!) 125/91  Pulse: 96 90 87 84  Resp:      Temp: 98 F (36.7 C)  97.7 F (36.5 C) 97.9 F (36.6 C)  TempSrc: Oral  Oral Oral  SpO2: 94%  94% 95%  Weight:    66.3 kg  Height:    5' 10"  (1.778 m)    Intake/Output Summary (Last 24 hours) at 11/18/2017 0755 Last data filed at 11/18/2017 0615 Gross per 24 hour  Intake 1362.98 ml  Output 1850 ml  Net -487.02 ml   Filed Weights   11/16/17 0500 11/18/17 0606  Weight: 63.6 kg 66.3 kg    Telemetry    SR - Personally Reviewed  ECG    N/a - Personally Reviewed  Physical Exam   General: Well developed, well nourished, male appearing in no acute distress. Head: Normocephalic, atraumatic.  Neck: Supple, no JVD. Lungs:  Resp regular and unlabored, CTA. Heart: RRR, S1, S2, no S3, S4, or murmur; no rub. Abdomen: Soft, non-tender, non-distended with normoactive bowel sounds.  Extremities: No clubbing, cyanosis, edema. Distal pedal pulses are 2+ bilaterally. Neuro: Alert and oriented X 3.  Moves all extremities spontaneously. Psych: Normal affect.  Labs    Chemistry Recent Labs  Lab 11/13/17 0416 11/14/17 2304 11/15/17 0422 11/17/17 0340  NA 137 136 140 140  K 3.5 3.5 3.2* 3.5  CL 102 101 102 104  CO2 25 25 26 25   GLUCOSE 111* 72 99 88  BUN <5* 5* 5* <5*  CREATININE 0.89 1.01 1.00 0.84  CALCIUM 8.6* 9.3 9.0 8.9  PROT 5.8*  --   --  5.8*  ALBUMIN 2.8*  --   --  2.7*  AST 51*  --   --  53*  ALT 45*  --   --  48*  ALKPHOS 67  --   --  68  BILITOT 1.1  --   --  0.5  GFRNONAA >60 >60 >60 >60  GFRAA >60 >60 >60 >60  ANIONGAP 10 10 12 11      Hematology Recent Labs  Lab 11/16/17 0312 11/17/17 0340 11/18/17 0429  WBC 8.7 8.0 8.8  RBC 4.19* 4.18* 4.29  HGB 13.9 13.8 14.4  HCT 42.7 42.3 43.8  MCV 101.9* 101.2* 102.1*  MCH 33.2 33.0 33.6  MCHC 32.6 32.6 32.9  RDW 12.7 12.7 12.9  PLT 253 263 272    Cardiac Enzymes Recent Labs  Lab 11/13/17 0416 11/15/17 0422 11/15/17 1044 11/15/17 1659  TROPONINI 0.13* 0.06* 0.06* 0.05*    Recent Labs  Lab 11/14/17 2316  TROPIPOC 0.04     BNPNo results for input(s): BNP, PROBNP in the last 168 hours.   DDimer No results for input(s): DDIMER in the last 168 hours.    Radiology    US Abdomen Complete  Result Date: 11/16/2017 CLINICAL DATA:  Elevated liver function tests. Chronic abdominal pain. EXAM: ABDOMEN ULTRASOUND COMPLETE COMPARISON:  CT on 11/12/2017 FINDINGS: Gallbladder: No gallstones or wall thickening visualized. No sonographic Murphy sign noted by sonographer. Common bile duct: Diameter: 5 mm, within normal limits. Liver: Diffusely increased echogenicity of the hepatic parenchyma, consistent with hepatic steatosis. No focal mass lesion identified. Portal vein is patent on color Doppler imaging with normal direction of blood flow towards the liver. IVC: No abnormality visualized. Pancreas: Visualized portion unremarkable. Spleen: Size and appearance within normal limits. Right Kidney: Length: 10.0  cm. Echogenicity within normal limits. No mass or hydronephrosis visualized. Left Kidney: Length: 10.1 cm. Echogenicity within normal limits. No mass or hydronephrosis visualized. Abdominal aorta: No aneurysm visualized. Other findings: None. IMPRESSION: Diffuse hepatic steatosis. No evidence of cholelithiasis, biliary ductal dilatation, or other significant abnormality. Electronically Signed   By: Earle Gell M.D.   On: 11/16/2017 20:59    Cardiac Studies   LEFT HEART CATH AND CORONARY ANGIOGRAPHY  11/13/17  Conclusion     Dist RCA lesion is 90% stenosed.  Prox RCA lesion is 30% stenosed.  Prox RCA to Mid RCA lesion is 50% stenosed.  Mid RCA lesion is 50% stenosed.  Prox Cx lesion is 75% stenosed.  Mid Cx lesion is 70% stenosed.  Ost LAD lesion is 50% stenosed.  Prox LAD-1 lesion is 50% stenosed.  Prox LAD-2 lesion is 95% stenosed.  Mid LAD lesion is 80% stenosed.  LV end diastolic pressure is normal.  The left ventricular systolic function is normal.  Severe multivessel coronary obstructive disease with diffuse 50% proximal LAD stenoses followed by 95% stenosis before the takeoff of the first diagonal vessel with 80% stenosis beyond the diagonal vessel in the mid LAD; 75 and 70% proximal left circumflex stenoses; and diffuse irregularity of the proximal and mid RCA with a narrowings up to 50% with focal 90% acute margin stenosis in a dominant RCA. Distal vessels are all very large.  Preserved global LV contractility with an ejection fraction of 55 to 60% but with a small region of mid inferior focal hypocontractility. LVEDP is 15 mmHg.  RECOMMENDATION: In this patient with severe diffuse multivessel coronary obstructive disease and excellent distal targets, recommend surgical consultation for consideration of CABG revascularization. Initiate high potency statin therapy with target LDL less than 80. Smoking cessation is imperative.  Recommend Aspirin 68m daily for  moderate CAD.Will not start DAPT in anticipation of CABG revascularization surgery. Will resume heparin 10 hours post procedure.    Echo 11/13/17 Study Conclusions  - Left ventricle: The cavity size was normal. Wall thickness was normal. Systolic function was normal. The estimated ejection fraction was in the range of 60% to 65%. Wall motion was normal; there were no regional wall motion abnormalities. Doppler parameters are consistent with abnormal left ventricular relaxation (grade 1 diastolic dysfunction). The E/e&' ratio is <8, suggesting normal LV filling pressure. - Left atrium: The atrium was normal in size. - Inferior vena cava: The vessel was normal in size. The respirophasic diameter changes were in the normal range (>= 50%), consistent  with normal central venous pressure.  Impressions:  - LVEF 60-65%, normal wall thickness, normal wall motion, grade 1 DD, normal LV filling pressure, normal LA size, normal IVC.   Patient Profile     47 y.o. male with a hx of CAD, tobacco abuse, alcohol abuse, COPDwho is being seen  for the evaluation of CAD and chest painat the request of Dr. [name].Patient recently found to have 3V CAD, CABG recommended, but left AMA. Called and instructed to return back to the ER.  Assessment & Plan    1. Multivessel CAD: Left AMA last week and then presented back to the ED with recurrent symptoms. Seen by Dr. Servando Snare and planned for surgery this Friday. Trop with flat trend. Remains on ASA, statin, BB and IV heparin. No chest pain.   2. ETOH abuse: on CIWA protocol. No withdrawal symptoms noted.   3. Tobacco use: using a nicotine patch.   Signed, Reino Bellis, NP  11/18/2017, 7:55 AM  Pager # [phone]   Agree with note by Reino Bellis NP-C   Mr. Bohorquez is pain-free awaiting CABG this coming Friday.  He remains on IV heparin and appropriate other medications.  He is on alcohol withdrawal protocol as well.   Physical exam is benign.   Lorretta Harp, M.D., [place], [facility], [place] [address] [place],   [address]  [phone] 11/18/2017 9:27 AM  For questions or updates, please contact [place] Please consult [email or web address] for contact info under Cardiology/STEMI.

## 2017-11-18 NOTE — Progress Notes (Signed)
Patient Demographics:    Greg [name], is a 47 y.o. male, DOB - 1970/11/16, OMB:[id number]  Admit date - 11/14/2017   Admitting Physician Norval Morton, MD  Outpatient Primary MD for the patient is Patient, No Pcp Per  LOS - 3   Chief Complaint  Patient presents with  . Chest Pain  . Shortness of Breath        Subjective:    Bain [name] today has  Greg [name]  with ambulating around in the room without dyspnea on exertion or chest pains, voiding well, had BM  Assessment  & Plan :    Principal Problem:   Coronary artery disease Active Problems:   NSTEMI (non-ST elevated myocardial infarction) ([place])   Alcohol abuse   Hyperlipidemia   Tobacco abuse  Brief Summary 47 y.o.malerecently diagnosed with three-vessel CAD, ongoing tobacco abuse, alcohol abuse, COPDwho readmitted with chest pains on 11/15/2017.  Recently admitted on 11/12/2017 with NSTEMI, CABG advised by CT surgery, however patient left AMA on 11/14/2017, readmitted 11/15/2017 awaiting CABG on Friday 11/21/17.     Plan:- 1)NSTEMI/3 Vessel CAD--- d/w Dr Debara Pickett and Dr Gwenlyn Found ok continue IV nitro IV heparin until CABG on 11/21/17, avoid Plavix as patient  Needs CABG soon, aspirin 81 mg daily is okay , d/w Lanelle Bal from CT surgery   remains chest pain-free at this time, continue Lipitor 40 mg daily,  c/n Metoprolol 12.5 mg twice daily, LDL is 114,  HDL is 75 (high HDL due to alcohol abuse and possible alcoholic liver disease)  2)ETOH Abuse--- currently not in DTs, continue lorazepam per CIWA protocol, continue multivitamin/thiamine/folic acid, MCV is 453.6, liver ultrasound with Diffuse hepatic steatosis  3)Tobacco Abuse--- per Dr Debara Pickett ok to continue nicotine patch  4) COPD--- stable without acute flareup at this time, PRN bronchodilators  Disposition/Need for in-Hospital Stay- patient unable to be discharged at this time  due to NSTEMI in the patient with diffuse three-vessel CAD awaiting CABG surgery, patient requires IV heparin and IV nitro  Code Status : Full code   Disposition Plan  : TBD after CABG  Consults  :  CT surgery/cardiology  DVT Prophylaxis  : IV heparin  Lab Results  Component Value Date   PLT 272 11/18/2017   Inpatient Medications  Scheduled Meds: . aspirin EC  81 mg Oral Daily  . atorvastatin  40 mg Oral q1800  . folic acid  1 mg Oral Daily  . LORazepam  0-4 mg Intravenous Q12H   Or  . LORazepam  0-4 mg Oral Q12H  . metoprolol tartrate  12.5 mg Oral BID  . nicotine  21 mg Transdermal Daily  . thiamine  100 mg Oral Daily   Or  . thiamine  100 mg Intravenous Daily   Continuous Infusions: . heparin 1,250 Units/hr (11/18/17 0700)  . nitroGLYCERIN 20 mcg/min (11/18/17 0615)   PRN Meds:.albuterol, gi cocktail, Influenza vac split quadrivalent PF, morphine injection, ondansetron (ZOFRAN) IV, pneumococcal 23 valent vaccine   Anti-infectives (From admission, onward)   None       Objective:   Vitals:   11/18/17 0009 11/18/17 0606 11/18/17 0857 11/18/17 0905  BP: (!) 138/96 (!) 125/91 121/86   Pulse: 87 84 86 83  Resp:  Temp: 97.7 F (36.5 C) 97.9 F (36.6 C)    TempSrc: Oral Oral    SpO2: 94% 95%    Weight:  66.3 kg    Height:  5' 10"  (1.778 m)      Wt Readings from Last 3 Encounters:  11/18/17 66.3 kg  11/12/17 65.8 kg    Intake/Output Summary (Last 24 hours) at 11/18/2017 1129 Last data filed at 11/18/2017 0700 Gross per 24 hour  Intake 1006.95 ml  Output 1650 ml  Net -643.05 ml    Physical Exam Patient is examined daily including today on 11/18/17 , exams remain the same as of yesterday except that has changed   Gen:- Awake Alert,  In no apparent distress  HEENT:- [place].AT, No sclera icterus Neck-Supple Neck,No JVD,.  Lungs-  CTAB , good air movement CV- S1, S2 normal, regular Abd-  +ve B.Sounds, Abd Soft, No tenderness,    Extremity/Skin:- No   edema,   good pulses Psych-affect is appropriate, oriented x3 Neuro-no new focal deficits, no tremors   Data Review:   Micro Results Recent Results (from the past 240 hour(s))  Surgical pcr screen     Status: None   Collection Time: 11/15/17  3:22 AM  Result Value Ref Range Status   MRSA, PCR NEGATIVE NEGATIVE Final   Staphylococcus aureus NEGATIVE NEGATIVE Final    Comment: (NOTE) The Xpert SA Assay (FDA approved for NASAL specimens in patients 66 years of age and older), is one component of a comprehensive surveillance program. It is not intended to diagnose infection nor to guide or monitor treatment. Performed at [facility], [place] [address] [place], [place] [address]     Radiology Reports Dg Chest 2 View  Result Date: 11/14/2017 CLINICAL DATA:  Chest pain and dyspnea x1 week EXAM: CHEST - 2 VIEW COMPARISON:  11/12/2017 FINDINGS: The heart size and mediastinal contours are within normal limits. Emphysematous hyperinflation of the lungs without pneumothorax, effusion or pulmonary consolidation. The visualized skeletal structures are unremarkable. IMPRESSION: Emphysematous hyperinflation of the lungs without acute cardiopulmonary abnormality. Electronically Signed   By: Ashley Royalty M.D.   On: 11/14/2017 23:57   Dg Chest 2 View  Result Date: 11/12/2017 CLINICAL DATA:  Shortness of breath and chest pain EXAM: CHEST - 2 VIEW COMPARISON:  CT of the chest from earlier in the same day. FINDINGS: The heart size and mediastinal contours are within normal limits. Both lungs are hyperinflated. The visualized skeletal structures are unremarkable. IMPRESSION: COPD without acute abnormality. Electronically Signed   By: Inez Catalina M.D.   On: 11/12/2017 21:49   US Abdomen Complete  Result Date: 11/16/2017 CLINICAL DATA:  Elevated liver function tests. Chronic abdominal pain. EXAM: ABDOMEN ULTRASOUND COMPLETE COMPARISON:  CT on 11/12/2017 FINDINGS: Gallbladder: No gallstones or wall  thickening visualized. No sonographic Murphy sign noted by sonographer. Common bile duct: Diameter: 5 mm, within normal limits. Liver: Diffusely increased echogenicity of the hepatic parenchyma, consistent with hepatic steatosis. No focal mass lesion identified. Portal vein is patent on color Doppler imaging with normal direction of blood flow towards the liver. IVC: No abnormality visualized. Pancreas: Visualized portion unremarkable. Spleen: Size and appearance within normal limits. Right Kidney: Length: 10.0 cm. Echogenicity within normal limits. No mass or hydronephrosis visualized. Left Kidney: Length: 10.1 cm. Echogenicity within normal limits. No mass or hydronephrosis visualized. Abdominal aorta: No aneurysm visualized. Other findings: None. IMPRESSION: Diffuse hepatic steatosis. No evidence of cholelithiasis, biliary ductal dilatation, or other significant abnormality. Electronically Signed  By: Earle Gell M.D.   On: 11/16/2017 20:59     CBC Recent Labs  Lab 11/14/17 2304 11/15/17 0422 11/16/17 0312 11/17/17 0340 11/18/17 0429  WBC 10.1 9.2 8.7 8.0 8.8  HGB 17.4* 15.8 13.9 13.8 14.4  HCT 53.3* 47.8 42.7 42.3 43.8  PLT 309 280 253 263 272  MCV 102.1* 102.1* 101.9* 101.2* 102.1*  MCH 33.3 33.8 33.2 33.0 33.6  MCHC 32.6 33.1 32.6 32.6 32.9  RDW 12.8 13.0 12.7 12.7 12.9  LYMPHSABS  --  1.7  --   --   --   MONOABS  --  1.2*  --   --   --   EOSABS  --  0.1  --   --   --   BASOSABS  --  0.1  --   --   --     Chemistries  Recent Labs  Lab 11/13/17 0416 11/14/17 2304 11/15/17 0422 11/17/17 0340  NA 137 136 140 140  K 3.5 3.5 3.2* 3.5  CL 102 101 102 104  CO2 25 25 26 25   GLUCOSE 111* 72 99 88  BUN <5* 5* 5* <5*  CREATININE 0.89 1.01 1.00 0.84  CALCIUM 8.6* 9.3 9.0 8.9  AST 51*  --   --  53*  ALT 45*  --   --  48*  ALKPHOS 67  --   --  68  BILITOT 1.1  --   --  0.5    ------------------------------------------------------------------------------------------------------------------ No results for input(s): CHOL, HDL, LDLCALC, TRIG, CHOLHDL, LDLDIRECT in the last 72 hours.  No results found for: HGBA1C ------------------------------------------------------------------------------------------------------------------ No results for input(s): TSH, T4TOTAL, T3FREE, THYROIDAB in the last 72 hours.  Invalid input(s): FREET3 ------------------------------------------------------------------------------------------------------------------ No results for input(s): VITAMINB12, FOLATE, FERRITIN, TIBC, IRON, RETICCTPCT in the last 72 hours.  Coagulation profile Recent Labs  Lab 11/13/17 0416 11/17/17 0340  INR 0.98 0.94    No results for input(s): DDIMER in the last 72 hours.  Cardiac Enzymes Recent Labs  Lab 11/15/17 0422 11/15/17 1044 11/15/17 1659  TROPONINI 0.06* 0.06* 0.05*   ------------------------------------------------------------------------------------------------------------------ No results found for: BNP  Roxan Hockey M.D on 11/18/2017 at 11:29 AM  Pager---[phone] Go to [email or web address] - password [email or web address] for contact info  [facility] - Office  [phone]

## 2017-11-18 NOTE — Progress Notes (Signed)
[facility] for Heparin  Indication: chest pain/ACS, recent cath, severe multi-vessel disease  No Known Allergies  Vital Signs: Temp: 97.9 F (36.6 C) (10/15 0606) Temp Source: Oral (10/15 0606) BP: 125/91 (10/15 0606) Pulse Rate: 84 (10/15 0606)  Labs: Recent Labs    11/15/17 1044 11/15/17 1659  11/16/17 0312 11/17/17 0340 11/18/17 0429  HGB  --   --    < > 13.9 13.8 14.4  HCT  --   --   --  42.7 42.3 43.8  PLT  --   --   --  253 263 272  LABPROT  --   --   --   --  12.5  --   INR  --   --   --   --  0.94  --   HEPARINUNFRC  --   --    < > 0.43 0.48 0.54  CREATININE  --   --   --   --  0.84  --   TROPONINI 0.06* 0.05*  --   --   --   --    < > = values in this interval not displayed.    Estimated Creatinine Clearance: 99.8 mL/min (by C-G formula based on SCr of 0.84 mg/dL).  Medical History: Past Medical History:  Diagnosis Date  . COPD (chronic obstructive pulmonary disease) ([place])   . Daily headache   . GERD (gastroesophageal reflux disease)   . Skin cancer    "cut off my eyelid and my neck" (11/12/2017)    Assessment:  46 yoM with recent cath on 10/10 that revealed multi-vessel disease, planning for CABG.  Heparin continues to be at goal: 0.54, CBC stable. No bleeding noted, awaiting surgery later this week.  Goal of Therapy:  Heparin level 0.3-0.7 units/ml Monitor platelets by anticoagulation protocol: Yes   Plan:  Continue heparin gtt at 1250 units/hr Daily CBC and heparin level Monitor for s/sx of bleeding  Erin Hearing PharmD., BCPS Clinical Pharmacist 11/18/2017 7:57 AM  Thank you for allowing pharmacy to be part of this patients care team.  11/18/2017 7:55 AM Please check AMION for all [facility] numbers

## 2017-11-18 NOTE — Progress Notes (Signed)
      [address]       [place] [address]             [phone]        Procedure(s) (LRB): CORONARY ARTERY BYPASS GRAFTING (CABG) (N/A) TRANSESOPHAGEAL ECHOCARDIOGRAM (TEE) (N/A) Subjective: No chest pain today but he doe have a headache from the nitro  Objective: Vital signs in last 24 hours: Temp:  [97.7 F (36.5 C)-98 F (36.7 C)] 97.9 F (36.6 C) (10/15 0606) Pulse Rate:  [83-96] 83 (10/15 0905) Cardiac Rhythm: Normal sinus rhythm (10/15 1214) BP: (106-138)/(86-100) 121/86 (10/15 0857) SpO2:  [93 %-95 %] 95 % (10/15 0606) Weight:  [66.3 kg] 66.3 kg (10/15 0606)     Intake/Output from previous day: 10/14 0701 - 10/15 0700 In: 1376.5 [P.O.:822; I.V.:554.5] Out: 1850 [Urine:1850] Intake/Output this shift: No intake/output data recorded.  General appearance: alert, cooperative and no distress Heart: regular rate and rhythm, S1, S2 normal, no murmur, click, rub or gallop Lungs: clear to auscultation bilaterally Abdomen: soft, non-tender; bowel sounds normal; no masses,  no organomegaly Extremities: extremities normal, atraumatic, no cyanosis or edema  Lab Results: Recent Labs    11/17/17 0340 11/18/17 0429  WBC 8.0 8.8  HGB 13.8 14.4  HCT 42.3 43.8  PLT 263 272   BMET:  Recent Labs    11/17/17 0340  NA 140  K 3.5  CL 104  CO2 25  GLUCOSE 88  BUN <5*  CREATININE 0.84  CALCIUM 8.9    PT/INR:  Recent Labs    11/17/17 0340  LABPROT 12.5  INR 0.94   ABG No results found for: PHART, HCO3, TCO2, ACIDBASEDEF, O2SAT CBG (last 3)  Recent Labs    11/17/17 1704  GLUCAP 105*    Assessment/Plan: S/P Procedure(s) (LRB): CORONARY ARTERY BYPASS GRAFTING (CABG) (N/A) TRANSESOPHAGEAL ECHOCARDIOGRAM (TEE) (N/A)  1. NSTEMI/multivessel disease-IV heparin and IV nitro. No chest pain. CABG planned for Friday 10/18.  2. ETOH abuse-continue CIWA protocol. Liver US reviewed with the patient showing diffuse hepatic streatosis. Will try to  optimize liver function in the perioperative period. 3. Tobacco abuse-current on a nicotine patch. Counseled on cessation.  4. COPD-continue bronchodilators. Not on oxygen.   Plan: Continue workup for CABG on Friday.    LOS: 3 days    Elgie Collard 11/18/2017

## 2017-11-19 ENCOUNTER — Encounter (HOSPITAL_COMMUNITY): Payer: Self-pay | Admitting: Cardiothoracic Surgery

## 2017-11-19 DIAGNOSIS — I2510 Atherosclerotic heart disease of native coronary artery without angina pectoris: Secondary | ICD-10-CM

## 2017-11-19 DIAGNOSIS — J449 Chronic obstructive pulmonary disease, unspecified: Secondary | ICD-10-CM

## 2017-11-19 HISTORY — DX: Chronic obstructive pulmonary disease, unspecified: J44.9

## 2017-11-19 LAB — COMPREHENSIVE METABOLIC PANEL
ALBUMIN: 2.8 g/dL — AB (ref 3.5–5.0)
ALK PHOS: 71 U/L (ref 38–126)
ALT: 50 U/L — AB (ref 0–44)
ANION GAP: 8 (ref 5–15)
AST: 46 U/L — ABNORMAL HIGH (ref 15–41)
BUN: 8 mg/dL (ref 6–20)
CALCIUM: 8.9 mg/dL (ref 8.9–10.3)
CO2: 25 mmol/L (ref 22–32)
Chloride: 104 mmol/L (ref 98–111)
Creatinine, Ser: 0.92 mg/dL (ref 0.61–1.24)
GFR calc non Af Amer: >60 mL/min (ref >60)
GLUCOSE: 105 mg/dL — AB (ref 70–99)
Potassium: 3.4 mmol/L — ABNORMAL LOW (ref 3.5–5.1)
Sodium: 137 mmol/L (ref 135–145)
Total Bilirubin: 0.4 mg/dL (ref 0.3–1.2)
Total Protein: 6.1 g/dL — ABNORMAL LOW (ref 6.5–8.1)

## 2017-11-19 LAB — CBC
HCT: 43.8 % (ref 39.0–52.0)
HEMOGLOBIN: 14.5 g/dL (ref 13.0–17.0)
MCH: 33.8 pg (ref 26.0–34.0)
MCHC: 33.1 g/dL (ref 30.0–36.0)
MCV: 102.1 fL — ABNORMAL HIGH (ref 80.0–100.0)
Platelets: 286 10*3/uL (ref 150–400)
RBC: 4.29 MIL/uL (ref 4.22–5.81)
RDW: 12.9 % (ref 11.5–15.5)
WBC: 9.1 10*3/uL (ref 4.0–10.5)
nRBC: 0 % (ref 0.0–0.2)

## 2017-11-19 LAB — HEPARIN LEVEL (UNFRACTIONATED): Heparin Unfractionated: 0.47 IU/mL (ref 0.30–0.70)

## 2017-11-19 LAB — BLOOD GAS, ARTERIAL
Acid-base deficit: 0 mmol/L (ref 0.0–2.0)
BICARBONATE: 24.1 mmol/L (ref 20.0–28.0)
Drawn by: 249101
FIO2: 0.21
O2 Saturation: 88.9 %
PATIENT TEMPERATURE: 98.6
PH ART: 7.409 (ref 7.350–7.450)
pCO2 arterial: 38.8 mmHg (ref 32.0–48.0)
pO2, Arterial: 61 mmHg — ABNORMAL LOW (ref 83.0–108.0)

## 2017-11-19 LAB — APTT: APTT: 94 s — AB (ref 24–36)

## 2017-11-19 LAB — HEMOGLOBIN A1C
Hgb A1c MFr Bld: 5.5 % (ref 4.8–5.6)
MEAN PLASMA GLUCOSE: 111.15 mg/dL

## 2017-11-19 MED ORDER — POTASSIUM CHLORIDE CRYS ER 20 MEQ PO TBCR
40.0000 meq | EXTENDED_RELEASE_TABLET | Freq: Once | ORAL | Status: AC
  Administered 2017-11-19: 40 meq via ORAL
  Filled 2017-11-19: qty 2

## 2017-11-19 MED ORDER — CHLORDIAZEPOXIDE HCL 5 MG PO CAPS
5.0000 mg | ORAL_CAPSULE | Freq: Three times a day (TID) | ORAL | Status: AC
  Administered 2017-11-19 (×3): 5 mg via ORAL
  Filled 2017-11-19 (×3): qty 1

## 2017-11-19 MED ORDER — CHLORDIAZEPOXIDE HCL 5 MG PO CAPS
5.0000 mg | ORAL_CAPSULE | Freq: Two times a day (BID) | ORAL | Status: DC
  Administered 2017-11-20 (×2): 5 mg via ORAL
  Filled 2017-11-19 (×2): qty 1

## 2017-11-19 MED ORDER — CALCIUM CARBONATE ANTACID 500 MG PO CHEW
1.0000 | CHEWABLE_TABLET | Freq: Every day | ORAL | Status: DC
  Administered 2017-11-19 – 2017-11-20 (×2): 200 mg via ORAL
  Filled 2017-11-19 (×2): qty 1

## 2017-11-19 NOTE — Care Management Note (Signed)
Case Management Note  Patient Details  Name: KAEO JACOME MRN: [id number] Date of Birth: 14-Dec-1970  Subjective/Objective: Pt presented for Chest Pain-post cath revealed multivessel disease. Plan for CABG 11-21-17. PTA from home with fiance. Pt states he is not working and does not have a PCP. Per patient his fiance has been looking into Medicaid for him. Fiance not at the bedside at the time of meeting. Financial Counselor is following the patient. Pt may be eligible for MATCH as it gets closer to transition home. CM did discuss with patient local clinics near [place]. [place] was discussed. Pt will need PCP established prior to transition home.                 Action/Plan: CM will continue to monitor for additional disposition needs.   Expected Discharge Date:                  Expected Discharge Plan:  [place]  In-House Referral:  Clinical Social Work, Development worker, community, PCP / Chief Executive Officer  CM Consult  Post Acute Care Choice:    Choice offered to:     DME Arranged:    DME Agency:     HH Arranged:    HH Agency:     Status of Service:  In process, will continue to follow  If discussed at Long Length of Stay Meetings, dates discussed:    Additional Comments:  Bethena Roys, RN 11/19/2017, 11:24 AM

## 2017-11-19 NOTE — Progress Notes (Addendum)
Progress Note  Patient Name: Greg [name] Date of Encounter: 11/19/2017  Primary Cardiologist: No primary care provider on file.  Subjective   Feeling well this morning. No chest pain.   Inpatient Medications    Scheduled Meds: . aspirin EC  81 mg Oral Daily  . atorvastatin  40 mg Oral q1800  . feeding supplement (ENSURE ENLIVE)  237 mL Oral BID BM  . folic acid  1 mg Oral Daily  . metoprolol tartrate  12.5 mg Oral BID  . nicotine  21 mg Transdermal Daily  . thiamine  100 mg Oral Daily   Or  . thiamine  100 mg Intravenous Daily   Continuous Infusions: . heparin 1,250 Units/hr (11/19/17 0650)  . nitroGLYCERIN 10 mcg/min (11/19/17 0600)   PRN Meds: albuterol, gi cocktail, Influenza vac split quadrivalent PF, morphine injection, ondansetron (ZOFRAN) IV, pneumococcal 23 valent vaccine   Vital Signs    Vitals:   11/18/17 2013 11/18/17 2044 11/19/17 0015 11/19/17 0339  BP: 114/89  110/79 (!) 121/91  Pulse: (!) 104 94 86 98  Resp: 18  18 18   Temp: 98.2 F (36.8 C)  98.3 F (36.8 C) 98.2 F (36.8 C)  TempSrc: Oral  Oral Oral  SpO2: 93%  93% 92%  Weight:    67.6 kg  Height:        Intake/Output Summary (Last 24 hours) at 11/19/2017 0804 Last data filed at 11/19/2017 0600 Gross per 24 hour  Intake 1125.59 ml  Output 600 ml  Net 525.59 ml   Filed Weights   11/16/17 0500 11/18/17 0606 11/19/17 0339  Weight: 63.6 kg 66.3 kg 67.6 kg    Telemetry    SR - Personally Reviewed  ECG    N/a- Personally Reviewed  Physical Exam   General: Well developed, well nourished, male appearing in no acute distress. Head: Normocephalic, atraumatic.  Neck: Supple without bruits, JVD. Lungs:  Resp regular and unlabored, CTA. Heart: RRR, S1, S2, no murmur; no rub. Abdomen: Soft, non-tender, non-distended with normoactive bowel sounds.  Extremities: No clubbing, cyanosis, edema. Distal pedal pulses are 2+ bilaterally. Neuro: Alert and oriented X 3. Moves all  extremities spontaneously. Psych: Normal affect.  Labs    Chemistry Recent Labs  Lab 11/13/17 0416  11/15/17 0422 11/17/17 0340 11/19/17 0333  NA 137   < > 140 140 137  K 3.5   < > 3.2* 3.5 3.4*  CL 102   < > 102 104 104  CO2 25   < > 26 25 25   GLUCOSE 111*   < > 99 88 105*  BUN <5*   < > 5* <5* 8  CREATININE 0.89   < > 1.00 0.84 0.92  CALCIUM 8.6*   < > 9.0 8.9 8.9  PROT 5.8*  --   --  5.8* 6.1*  ALBUMIN 2.8*  --   --  2.7* 2.8*  AST 51*  --   --  53* 46*  ALT 45*  --   --  48* 50*  ALKPHOS 67  --   --  68 71  BILITOT 1.1  --   --  0.5 0.4  GFRNONAA >60   < > >60 >60 >60  GFRAA >60   < > >60 >60 >60  ANIONGAP 10   < > 12 11 8    < > = values in this interval not displayed.     Hematology Recent Labs  Lab 11/17/17 0340 11/18/17 0429 11/19/17 0333  WBC  8.0 8.8 9.1  RBC 4.18* 4.29 4.29  HGB 13.8 14.4 14.5  HCT 42.3 43.8 43.8  MCV 101.2* 102.1* 102.1*  MCH 33.0 33.6 33.8  MCHC 32.6 32.9 33.1  RDW 12.7 12.9 12.9  PLT 263 272 286    Cardiac Enzymes Recent Labs  Lab 11/13/17 0416 11/15/17 0422 11/15/17 1044 11/15/17 1659  TROPONINI 0.13* 0.06* 0.06* 0.05*    Recent Labs  Lab 11/14/17 2316  TROPIPOC 0.04     BNPNo results for input(s): BNP, PROBNP in the last 168 hours.   DDimer No results for input(s): DDIMER in the last 168 hours.    Radiology    No results found.  Cardiac Studies   LEFT HEART CATH AND CORONARY ANGIOGRAPHY10/10/19  Conclusion     Dist RCA lesion is 90% stenosed.  Prox RCA lesion is 30% stenosed.  Prox RCA to Mid RCA lesion is 50% stenosed.  Mid RCA lesion is 50% stenosed.  Prox Cx lesion is 75% stenosed.  Mid Cx lesion is 70% stenosed.  Ost LAD lesion is 50% stenosed.  Prox LAD-1 lesion is 50% stenosed.  Prox LAD-2 lesion is 95% stenosed.  Mid LAD lesion is 80% stenosed.  LV end diastolic pressure is normal.  The left ventricular systolic function is normal.  Severe multivessel coronary obstructive  disease with diffuse 50% proximal LAD stenoses followed by 95% stenosis before the takeoff of the first diagonal vessel with 80% stenosis beyond the diagonal vessel in the mid LAD; 75 and 70% proximal left circumflex stenoses; and diffuse irregularity of the proximal and mid RCA with a narrowings up to 50% with focal 90% acute margin stenosis in a dominant RCA. Distal vessels are all very large.  Preserved global LV contractility with an ejection fraction of 55 to 60% but with a small region of mid inferior focal hypocontractility. LVEDP is 15 mmHg.  RECOMMENDATION: In this patient with severe diffuse multivessel coronary obstructive disease and excellent distal targets, recommend surgical consultation for consideration of CABG revascularization. Initiate high potency statin therapy with target LDL less than 80. Smoking cessation is imperative.  Recommend Aspirin 54m daily for moderate CAD.Will not start DAPT in anticipation of CABG revascularization surgery. Will resume heparin 10 hours post procedure.    Echo 11/13/17 Study Conclusions  - Left ventricle: The cavity size was normal. Wall thickness was normal. Systolic function was normal. The estimated ejection fraction was in the range of 60% to 65%. Wall motion was normal; there were no regional wall motion abnormalities. Doppler parameters are consistent with abnormal left ventricular relaxation (grade 1 diastolic dysfunction). The E/e&' ratio is <8, suggesting normal LV filling pressure. - Left atrium: The atrium was normal in size. - Inferior vena cava: The vessel was normal in size. The respirophasic diameter changes were in the normal range (>= 50%), consistent with normal central venous pressure.  Impressions:  - LVEF 60-65%, normal wall thickness, normal wall motion, grade 1 DD, normal LV filling pressure, normal LA size, normal IVC.  Patient Profile     47y.o. male with a hx of CAD,  tobacco abuse, alcohol abuse, COPDwho is being seen for the evaluation of CAD and chest painat the request of Dr. STamala JulianPatient recently found to have 3V CAD, CABG recommended, but left AMA. Called and instructed to return back to the ER.  Assessment & Plan    1. Multivessel CAD: Left AMA last week and then presented back to the ED with recurrent symptoms. Seen by Dr. GServando Snareand  planned for surgery this Friday. Trop with flat trend. Remains on ASA, statin, BB and IV heparin. No chest pain.   2. ETOH abuse: on CIWA protocol. No withdrawal symptoms noted.   3. Tobacco use: using a nicotine patch.  4. Hypokalemia: replete this morning.   Signed, Reino Bellis, NP  11/19/2017, 8:04 AM  Pager # [phone]   Agree with note by Reino Bellis NP-C  Cardiac stable.  Significant CAD requiring bypass grafting.  Scheduled for this Friday.  On withdrawal protocol without symptoms.  Patient remains on IV heparin.  Lorretta Harp, M.D., [place], [facility], [place] [address] [place], [place]  [address]  [phone] 11/19/2017 11:29 AM  For questions or updates, please contact [place] Please consult [email or web address] for contact info under Cardiology/STEMI.

## 2017-11-19 NOTE — Progress Notes (Signed)
PROGRESS NOTE  Greg [name]  OFB:[id number] DOB: [birth year] DOA: 11/14/2017 PCP: Patient, No Pcp Per  Brief Narrative: Greg [name] is a 47 y.o. male recently diagnosed with three-vessel CAD, tobacco use, alcohol abuse, and COPD who presented on 10/12 with ongoing symptoms after having left against medical advice the previous day. He has been undergoing CIWA protocol for alcohol withdrawal and reporting uncontrolled anxiety. Plan is for CABG 11/21/2017.  Assessment & Plan: Principal Problem:   Coronary artery disease Active Problems:   NSTEMI (non-ST elevated myocardial infarction) ([place])   Alcohol abuse   Hyperlipidemia   Tobacco abuse  NSTEMI, severe CAD:  - Continue IV NTG, heparin - Continue ASA, avoiding plavix with imminent CABG 11/21/2017.  - Continue statin, beta blocker.   Alcohol abuse and withdrawal: 12-18 Busch beers (12oz)/day.  - CIWA order has timed out and pt starting to have increasing symptoms. Will start taper of librium. Do not anticipate worsening withdrawal requiring escalation of care this far out from last drink. - Multivitamins as ordered.  Tobacco use:  - Intensive cessation counseling provided with need for CABG.  - Continue highest dose nicotine patch.   Hepatic steatosis: Due to alcohol abuse, seen on U/S.  - Monitor LFTs and cessation of alcohol  COPD: No flare - Continue prn bronchodilators.   DVT prophylaxis: IV heparin Code Status: Full Family Communication: None at bedside Disposition Plan: Pending postoperative course.  Consultants:   Cardiothoracic surgery  Cardiology  Procedures:  LEFT HEART CATH AND CORONARY ANGIOGRAPHY10/10/19  Conclusion     Dist RCA lesion is 90% stenosed.  Prox RCA lesion is 30% stenosed.  Prox RCA to Mid RCA lesion is 50% stenosed.  Mid RCA lesion is 50% stenosed.  Prox Cx lesion is 75% stenosed.  Mid Cx lesion is 70% stenosed.  Ost LAD lesion is 50% stenosed.  Prox LAD-1 lesion is  50% stenosed.  Prox LAD-2 lesion is 95% stenosed.  Mid LAD lesion is 80% stenosed.  LV end diastolic pressure is normal.  The left ventricular systolic function is normal.  Severe multivessel coronary obstructive disease with diffuse 50% proximal LAD stenoses followed by 95% stenosis before the takeoff of the first diagonal vessel with 80% stenosis beyond the diagonal vessel in the mid LAD; 75 and 70% proximal left circumflex stenoses; and diffuse irregularity of the proximal and mid RCA with a narrowings up to 50% with focal 90% acute margin stenosis in a dominant RCA. Distal vessels are all very large.  Preserved global LV contractility with an ejection fraction of 55 to 60% but with a small region of mid inferior focal hypocontractility. LVEDP is 15 mmHg.  RECOMMENDATION: In this patient with severe diffuse multivessel coronary obstructive disease and excellent distal targets, recommend surgical consultation for consideration of CABG revascularization. Initiate high potency statin therapy with target LDL less than 80. Smoking cessation is imperative.  Recommend Aspirin 33m daily for moderate CAD.Will not start DAPT in anticipation of CABG revascularization surgery. Will resume heparin 10 hours post procedure.    Echo 11/13/17 Study Conclusions  - Left ventricle: The cavity size was normal. Wall thickness was normal. Systolic function was normal. The estimated ejection fraction was in the range of 60% to 65%. Wall motion was normal; there were no regional wall motion abnormalities. Doppler parameters are consistent with abnormal left ventricular relaxation (grade 1 diastolic dysfunction). The E/e&' ratio is <8, suggesting normal LV filling pressure. - Left atrium: The atrium was normal in size. - Inferior vena  cava: The vessel was normal in size. The respirophasic diameter changes were in the normal range (>= 50%), consistent with normal central  venous pressure.  Impressions:  - LVEF 60-65%, normal wall thickness, normal wall motion, grade 1 DD, normal LV filling pressure, normal LA size, normal IVC.  Antimicrobials:  None   Subjective: No chest pain, dyspnea or palpitations. Denies bleeding. Some mild headache. Has been growing more anxious since last ativan, threatening to leave again against medical advice for cigarettes.  Objective: Vitals:   11/18/17 2044 11/19/17 0015 11/19/17 0339 11/19/17 0800  BP:  110/79 (!) 121/91 (!) 140/105  Pulse: 94 86 98 87  Resp:  18 18 18   Temp:  98.3 F (36.8 C) 98.2 F (36.8 C) 98 F (36.7 C)  TempSrc:  Oral Oral Oral  SpO2:  93% 92% 94%  Weight:   67.6 kg   Height:        Intake/Output Summary (Last 24 hours) at 11/19/2017 1139 Last data filed at 11/19/2017 1000 Gross per 24 hour  Intake 947.59 ml  Output 600 ml  Net 347.59 ml   Filed Weights   11/16/17 0500 11/18/17 0606 11/19/17 0339  Weight: 63.6 kg 66.3 kg 67.6 kg    Gen: Restless 47 y.o. male in no distress  Pulm: Non-labored breathing room air. Clear to auscultation bilaterally.  CV: Regular rate and rhythm. No murmur, rub, or gallop. No JVD, no pedal edema. GI: Abdomen soft, non-tender, non-distended, with normoactive bowel sounds. No organomegaly or masses felt. Ext: Warm, no deformities Skin: No rashes, lesions or ulcers Neuro: Alert and oriented. Diffuse intention tremor without asterixis. No focal neurological deficits. Psych: Judgement and insight appear abnormal. Mood is anxious with broad affect.   Data Reviewed: I have personally reviewed following labs and imaging studies  CBC: Recent Labs  Lab 11/15/17 0422 11/16/17 0312 11/17/17 0340 11/18/17 0429 11/19/17 0333  WBC 9.2 8.7 8.0 8.8 9.1  NEUTROABS 6.1  --   --   --   --   HGB 15.8 13.9 13.8 14.4 14.5  HCT 47.8 42.7 42.3 43.8 43.8  MCV 102.1* 101.9* 101.2* 102.1* 102.1*  PLT 280 253 263 272 829   Basic Metabolic Panel: Recent Labs    Lab 11/13/17 0416 11/14/17 2304 11/15/17 0422 11/17/17 0340 11/19/17 0333  NA 137 136 140 140 137  K 3.5 3.5 3.2* 3.5 3.4*  CL 102 101 102 104 104  CO2 25 25 26 25 25   GLUCOSE 111* 72 99 88 105*  BUN <5* 5* 5* <5* 8  CREATININE 0.89 1.01 1.00 0.84 0.92  CALCIUM 8.6* 9.3 9.0 8.9 8.9   GFR: Estimated Creatinine Clearance: 92.9 mL/min (by C-G formula based on SCr of 0.92 mg/dL). Liver Function Tests: Recent Labs  Lab 11/13/17 0416 11/17/17 0340 11/19/17 0333  AST 51* 53* 46*  ALT 45* 48* 50*  ALKPHOS 67 68 71  BILITOT 1.1 0.5 0.4  PROT 5.8* 5.8* 6.1*  ALBUMIN 2.8* 2.7* 2.8*   No results for input(s): LIPASE, AMYLASE in the last 168 hours. Recent Labs  Lab 11/17/17 0340  AMMONIA 41*   Coagulation Profile: Recent Labs  Lab 11/13/17 0416 11/17/17 0340  INR 0.98 0.94   Cardiac Enzymes: Recent Labs  Lab 11/12/17 2218 11/13/17 0416 11/15/17 0422 11/15/17 1044 11/15/17 1659  TROPONINI 0.13* 0.13* 0.06* 0.06* 0.05*   BNP (last 3 results) No results for input(s): PROBNP in the last 8760 hours. HbA1C: Recent Labs    11/19/17 0333  HGBA1C 5.5   CBG: Recent Labs  Lab 11/17/17 1704  GLUCAP 105*   Lipid Profile: No results for input(s): CHOL, HDL, LDLCALC, TRIG, CHOLHDL, LDLDIRECT in the last 72 hours. Thyroid Function Tests: No results for input(s): TSH, T4TOTAL, FREET4, T3FREE, THYROIDAB in the last 72 hours. Anemia Panel: No results for input(s): VITAMINB12, FOLATE, FERRITIN, TIBC, IRON, RETICCTPCT in the last 72 hours. Urine analysis:    Component Value Date/Time   COLORURINE YELLOW 11/18/2017 1130   APPEARANCEUR CLEAR 11/18/2017 1130   LABSPEC 1.012 11/18/2017 1130   PHURINE 7.0 11/18/2017 1130   GLUCOSEU NEGATIVE 11/18/2017 1130   HGBUR NEGATIVE 11/18/2017 1130   BILIRUBINUR NEGATIVE 11/18/2017 1130   KETONESUR NEGATIVE 11/18/2017 1130   PROTEINUR NEGATIVE 11/18/2017 1130   NITRITE NEGATIVE 11/18/2017 1130   LEUKOCYTESUR NEGATIVE 11/18/2017  1130   Recent Results (from the past 240 hour(s))  Surgical pcr screen     Status: None   Collection Time: 11/15/17  3:22 AM  Result Value Ref Range Status   MRSA, PCR NEGATIVE NEGATIVE Final   Staphylococcus aureus NEGATIVE NEGATIVE Final    Comment: (NOTE) The Xpert SA Assay (FDA approved for NASAL specimens in patients 30 years of age and older), is one component of a comprehensive surveillance program. It is not intended to diagnose infection nor to guide or monitor treatment. Performed at [facility], [place] [address] [place], [place] [address]       Radiology Studies: No results found.  Scheduled Meds: . aspirin EC  81 mg Oral Daily  . atorvastatin  40 mg Oral q1800  . chlordiazePOXIDE  5 mg Oral TID   Followed by  . [START ON 11/20/2017] chlordiazePOXIDE  5 mg Oral BID  . feeding supplement (ENSURE ENLIVE)  237 mL Oral BID BM  . folic acid  1 mg Oral Daily  . metoprolol tartrate  12.5 mg Oral BID  . nicotine  21 mg Transdermal Daily  . thiamine  100 mg Oral Daily   Or  . thiamine  100 mg Intravenous Daily   Continuous Infusions: . heparin 1,250 Units/hr (11/19/17 1000)  . nitroGLYCERIN 10 mcg/min (11/19/17 1000)     LOS: 4 days   Time spent: 25 minutes.  Patrecia Pour, MD [facility] [email or web address] Password [email or web address] 11/19/2017, 11:39 AM

## 2017-11-19 NOTE — Progress Notes (Addendum)
The patient is requesting Ativan for anxiety about not being able to smoke. Stating, " If I don't get something soon I will leave again." I notified Grunz MD. Awaiting call back or orders. I will continue to monitor the patient closely.   Saddie Benders RN   Addendum:  Bonner Puna at bedside assessing patient.

## 2017-11-19 NOTE — Progress Notes (Signed)
ANTICOAGULATION CONSULT NOTE   Pharmacy Consult for Heparin  Indication: chest pain/ACS, recent cath, severe multi-vessel disease  No Known Allergies  Vital Signs: Temp: 98 F (36.7 C) (10/16 0800) Temp Source: Oral (10/16 0800) BP: 140/105 (10/16 0800) Pulse Rate: 87 (10/16 0800)  Labs: Recent Labs    11/17/17 0340 11/18/17 0429 11/19/17 0333  HGB 13.8 14.4 14.5  HCT 42.3 43.8 43.8  PLT 263 272 286  APTT  --   --  94*  LABPROT 12.5  --   --   INR 0.94  --   --   HEPARINUNFRC 0.48 0.54 0.47  CREATININE 0.84  --  0.92    Estimated Creatinine Clearance: 92.9 mL/min (by C-G formula based on SCr of 0.92 mg/dL).  Medical History: Past Medical History:  Diagnosis Date  . COPD (chronic obstructive pulmonary disease) ([place])   . Daily headache   . GERD (gastroesophageal reflux disease)   . Skin cancer    "cut off my eyelid and my neck" (11/12/2017)    Assessment:  75 yoM with recent cath on 10/10 that revealed multi-vessel disease, planning for CABG.  Heparin continues to be at goal: 0.47, CBC stable. No bleeding noted, awaiting surgery later this week.  Goal of Therapy:  Heparin level 0.3-0.7 units/ml Monitor platelets by anticoagulation protocol: Yes   Plan:  Continue heparin gtt at 1250 units/hr Daily CBC and heparin level Monitor for s/sx of bleeding  Erin Hearing PharmD., BCPS Clinical Pharmacist 11/19/2017 8:28 AM  Thank you for allowing pharmacy to be part of this patients care team.  Please check AMION for all [place] numbers

## 2017-11-20 DIAGNOSIS — I200 Unstable angina: Secondary | ICD-10-CM

## 2017-11-20 DIAGNOSIS — J449 Chronic obstructive pulmonary disease, unspecified: Secondary | ICD-10-CM

## 2017-11-20 LAB — CBC
HEMATOCRIT: 48.8 % (ref 39.0–52.0)
HEMOGLOBIN: 15.7 g/dL (ref 13.0–17.0)
MCH: 32.9 pg (ref 26.0–34.0)
MCHC: 32.2 g/dL (ref 30.0–36.0)
MCV: 102.3 fL — ABNORMAL HIGH (ref 80.0–100.0)
NRBC: 0 % (ref 0.0–0.2)
Platelets: 322 10*3/uL (ref 150–400)
RBC: 4.77 MIL/uL (ref 4.22–5.81)
RDW: 12.8 % (ref 11.5–15.5)
WBC: 8.2 10*3/uL (ref 4.0–10.5)

## 2017-11-20 LAB — TYPE AND SCREEN
ABO/RH(D): O POS
Antibody Screen: NEGATIVE

## 2017-11-20 LAB — SURGICAL PCR SCREEN
MRSA, PCR: NEGATIVE
STAPHYLOCOCCUS AUREUS: NEGATIVE

## 2017-11-20 LAB — PROTIME-INR
INR: 0.96
Prothrombin Time: 12.7 seconds (ref 11.4–15.2)

## 2017-11-20 LAB — COMPREHENSIVE METABOLIC PANEL
ALT: 54 U/L — ABNORMAL HIGH (ref 0–44)
AST: 45 U/L — ABNORMAL HIGH (ref 15–41)
Albumin: 3.2 g/dL — ABNORMAL LOW (ref 3.5–5.0)
Alkaline Phosphatase: 80 U/L (ref 38–126)
Anion gap: 7 (ref 5–15)
BUN: 5 mg/dL — ABNORMAL LOW (ref 6–20)
CO2: 26 mmol/L (ref 22–32)
Calcium: 9.4 mg/dL (ref 8.9–10.3)
Chloride: 104 mmol/L (ref 98–111)
Creatinine, Ser: 0.95 mg/dL (ref 0.61–1.24)
GFR calc Af Amer: >60 mL/min (ref >60)
GFR calc non Af Amer: >60 mL/min (ref >60)
Glucose, Bld: 103 mg/dL — ABNORMAL HIGH (ref 70–99)
Potassium: 4 mmol/L (ref 3.5–5.1)
Sodium: 137 mmol/L (ref 135–145)
Total Bilirubin: 0.5 mg/dL (ref 0.3–1.2)
Total Protein: 7 g/dL (ref 6.5–8.1)

## 2017-11-20 LAB — ABO/RH: ABO/RH(D): O POS

## 2017-11-20 LAB — HEPARIN LEVEL (UNFRACTIONATED): Heparin Unfractionated: 0.55 IU/mL (ref 0.30–0.70)

## 2017-11-20 MED ORDER — SODIUM CHLORIDE 0.9 % IV SOLN
1.5000 g | INTRAVENOUS | Status: AC
  Administered 2017-11-21: 1.5 g via INTRAVENOUS
  Filled 2017-11-20: qty 1.5

## 2017-11-20 MED ORDER — TEMAZEPAM 15 MG PO CAPS: 15.0000 mg | ORAL_CAPSULE | Freq: Once | ORAL | Status: DC | PRN

## 2017-11-20 MED ORDER — NITROGLYCERIN IN D5W 200-5 MCG/ML-% IV SOLN
2.0000 ug/min | INTRAVENOUS | Status: DC
  Filled 2017-11-20: qty 250

## 2017-11-20 MED ORDER — PHENYLEPHRINE HCL-NACL 20-0.9 MG/250ML-% IV SOLN
30.0000 ug/min | INTRAVENOUS | Status: AC
  Administered 2017-11-21: 25 ug/min via INTRAVENOUS
  Filled 2017-11-20: qty 250

## 2017-11-20 MED ORDER — CHLORHEXIDINE GLUCONATE CLOTH 2 % EX PADS
6.0000 | MEDICATED_PAD | Freq: Once | CUTANEOUS | Status: AC
  Administered 2017-11-21: 6 via TOPICAL

## 2017-11-20 MED ORDER — POTASSIUM CHLORIDE 2 MEQ/ML IV SOLN
80.0000 meq | INTRAVENOUS | Status: DC
  Filled 2017-11-20: qty 40

## 2017-11-20 MED ORDER — BISACODYL 5 MG PO TBEC
5.0000 mg | DELAYED_RELEASE_TABLET | Freq: Once | ORAL | Status: AC
  Administered 2017-11-20: 5 mg via ORAL
  Filled 2017-11-20: qty 1

## 2017-11-20 MED ORDER — SODIUM CHLORIDE 0.9 % IV SOLN
750.0000 mg | INTRAVENOUS | Status: AC
  Administered 2017-11-21: 750 mg via INTRAVENOUS
  Filled 2017-11-20: qty 750

## 2017-11-20 MED ORDER — EPINEPHRINE PF 1 MG/ML IJ SOLN
0.0000 ug/min | INTRAVENOUS | Status: DC
  Filled 2017-11-20: qty 4

## 2017-11-20 MED ORDER — CHLORHEXIDINE GLUCONATE 0.12 % MT SOLN
15.0000 mL | Freq: Once | OROMUCOSAL | Status: AC
  Administered 2017-11-21: 15 mL via OROMUCOSAL
  Filled 2017-11-20: qty 15

## 2017-11-20 MED ORDER — INSULIN REGULAR(HUMAN) IN NACL 100-0.9 UT/100ML-% IV SOLN
INTRAVENOUS | Status: AC
  Administered 2017-11-21: .8 [IU]/h via INTRAVENOUS
  Filled 2017-11-20: qty 100

## 2017-11-20 MED ORDER — TRANEXAMIC ACID (OHS) BOLUS VIA INFUSION
15.0000 mg/kg | INTRAVENOUS | Status: AC
  Administered 2017-11-21: 1003.5 mg via INTRAVENOUS
  Filled 2017-11-20: qty 1004

## 2017-11-20 MED ORDER — METOPROLOL TARTRATE 12.5 MG HALF TABLET
12.5000 mg | ORAL_TABLET | Freq: Once | ORAL | Status: AC
  Administered 2017-11-21: 12.5 mg via ORAL
  Filled 2017-11-20: qty 1

## 2017-11-20 MED ORDER — MUPIROCIN 2 % EX OINT
1.0000 "application " | TOPICAL_OINTMENT | Freq: Two times a day (BID) | CUTANEOUS | Status: DC
  Administered 2017-11-20 (×2): 1 via NASAL
  Filled 2017-11-20: qty 22

## 2017-11-20 MED ORDER — CHLORHEXIDINE GLUCONATE CLOTH 2 % EX PADS
6.0000 | MEDICATED_PAD | Freq: Once | CUTANEOUS | Status: AC
  Administered 2017-11-20: 6 via TOPICAL

## 2017-11-20 MED ORDER — MAGNESIUM SULFATE 50 % IJ SOLN
40.0000 meq | INTRAMUSCULAR | Status: DC
  Filled 2017-11-20: qty 9.85

## 2017-11-20 MED ORDER — DEXMEDETOMIDINE HCL IN NACL 400 MCG/100ML IV SOLN
0.1000 ug/kg/h | INTRAVENOUS | Status: AC
  Administered 2017-11-21: .5 ug/kg/h via INTRAVENOUS
  Filled 2017-11-20: qty 100

## 2017-11-20 MED ORDER — DOPAMINE-DEXTROSE 3.2-5 MG/ML-% IV SOLN
0.0000 ug/kg/min | INTRAVENOUS | Status: DC
  Filled 2017-11-20: qty 250

## 2017-11-20 MED ORDER — TRANEXAMIC ACID (OHS) PUMP PRIME SOLUTION
2.0000 mg/kg | INTRAVENOUS | Status: DC
  Filled 2017-11-20: qty 1.34

## 2017-11-20 MED ORDER — MILRINONE LACTATE IN DEXTROSE 20-5 MG/100ML-% IV SOLN
0.3000 ug/kg/min | INTRAVENOUS | Status: DC
  Filled 2017-11-20: qty 100

## 2017-11-20 MED ORDER — TRANEXAMIC ACID 1000 MG/10ML IV SOLN
1.5000 mg/kg/h | INTRAVENOUS | Status: AC
  Administered 2017-11-21: 1.5 mg/kg/h via INTRAVENOUS
  Filled 2017-11-20: qty 25

## 2017-11-20 MED ORDER — SODIUM CHLORIDE 0.9 % IV SOLN
INTRAVENOUS | Status: DC
  Filled 2017-11-20: qty 30

## 2017-11-20 MED ORDER — PLASMA-LYTE 148 IV SOLN
INTRAVENOUS | Status: DC
  Filled 2017-11-20: qty 2.5

## 2017-11-20 MED ORDER — VANCOMYCIN HCL 10 G IV SOLR
1250.0000 mg | INTRAVENOUS | Status: AC
  Administered 2017-11-21 (×2): 1250 mg via INTRAVENOUS
  Filled 2017-11-20: qty 1250

## 2017-11-20 NOTE — Progress Notes (Signed)
ANTICOAGULATION CONSULT NOTE   Pharmacy Consult for Heparin  Indication: chest pain/ACS, recent cath, severe multi-vessel disease  No Known Allergies  Vital Signs: Temp: 97.7 F (36.5 C) (10/17 0739) Temp Source: Oral (10/17 0739) BP: 138/92 (10/17 0739) Pulse Rate: 82 (10/17 0739)  Labs: Recent Labs    11/18/17 0429 11/19/17 0333 11/20/17 0527  HGB 14.4 14.5 15.7  HCT 43.8 43.8 48.8  PLT 272 286 322  APTT  --  94*  --   LABPROT  --   --  12.7  INR  --   --  0.96  HEPARINUNFRC 0.54 0.47 0.55  CREATININE  --  0.92 0.95    Estimated Creatinine Clearance: 89 mL/min (by C-G formula based on SCr of 0.95 mg/dL).  Medical History: Past Medical History:  Diagnosis Date  . COPD (chronic obstructive pulmonary disease) ([place])   . COPD, severe ([place]) 11/19/2017   fev1 33% predicted   . Daily headache   . GERD (gastroesophageal reflux disease)   . Skin cancer    "cut off my eyelid and my neck" (11/12/2017)    Assessment:  62 yoM with recent cath on 10/10 that revealed multi-vessel disease, planning for CABG.  Heparin continues to be at goal: 0.5, CBC stable. No bleeding noted, awaiting surgery tomorrow.   Goal of Therapy:  Heparin level 0.3-0.7 units/ml Monitor platelets by anticoagulation protocol: Yes   Plan:  Continue heparin gtt at 1250 units/hr Daily CBC and heparin level Monitor for s/sx of bleeding  Erin Hearing PharmD., BCPS Clinical Pharmacist 11/20/2017 7:51 AM  Thank you for allowing pharmacy to be part of this patients care team.  Please check AMION for all [place] numbers

## 2017-11-20 NOTE — Progress Notes (Signed)
PROGRESS NOTE  Greg [name]  KGU:[id number] DOB: 10-06-1970 DOA: 11/14/2017 PCP: Patient, No Pcp Per  Brief Narrative: Greg [name] is a 47 y.o. male recently diagnosed with three-vessel CAD, tobacco use, alcohol abuse, and COPD who presented on 10/12 with ongoing symptoms after having left against medical advice the previous day. He has been undergoing CIWA protocol for alcohol withdrawal and reporting uncontrolled anxiety. Plan is for CABG 11/21/2017.  Assessment & Plan: Principal Problem:   Coronary artery disease Active Problems:   NSTEMI (non-ST elevated myocardial infarction) ([place])   Alcohol abuse   Hyperlipidemia   Tobacco abuse   COPD, severe ([place])   Unstable angina pectoris ([facility])  NSTEMI, severe CAD:  - Continue IV NTG, heparin - Continue ASA, avoiding plavix with imminent CABG 11/21/2017.  - Continue statin, beta blocker.   Alcohol abuse and withdrawal: 12-18 Busch beers (12oz)/day.  - Will complete librium taper 10/18 AM. Do not anticipate worsening withdrawal requiring escalation of care this far out from last drink. - Multivitamins as ordered.  Tobacco use:  - Intensive cessation counseling provided with need for CABG.  - Continue highest dose nicotine patch.   Hepatic steatosis: Due to alcohol abuse, seen on U/S.  - Monitor LFTs and cessation of alcohol  COPD: No flare - Continue prn bronchodilators.   DVT prophylaxis: IV heparin Code Status: Full Family Communication: None at bedside Disposition Plan: Pending postoperative course. CABG 10/18.  Consultants:   Cardiothoracic surgery  Cardiology  Procedures:  LEFT HEART CATH AND CORONARY ANGIOGRAPHY10/10/19  Conclusion     Dist RCA lesion is 90% stenosed.  Prox RCA lesion is 30% stenosed.  Prox RCA to Mid RCA lesion is 50% stenosed.  Mid RCA lesion is 50% stenosed.  Prox Cx lesion is 75% stenosed.  Mid Cx lesion is 70% stenosed.  Ost LAD lesion is 50% stenosed.  Prox LAD-1  lesion is 50% stenosed.  Prox LAD-2 lesion is 95% stenosed.  Mid LAD lesion is 80% stenosed.  LV end diastolic pressure is normal.  The left ventricular systolic function is normal.  Severe multivessel coronary obstructive disease with diffuse 50% proximal LAD stenoses followed by 95% stenosis before the takeoff of the first diagonal vessel with 80% stenosis beyond the diagonal vessel in the mid LAD; 75 and 70% proximal left circumflex stenoses; and diffuse irregularity of the proximal and mid RCA with a narrowings up to 50% with focal 90% acute margin stenosis in a dominant RCA. Distal vessels are all very large.  Preserved global LV contractility with an ejection fraction of 55 to 60% but with a small region of mid inferior focal hypocontractility. LVEDP is 15 mmHg.  RECOMMENDATION: In this patient with severe diffuse multivessel coronary obstructive disease and excellent distal targets, recommend surgical consultation for consideration of CABG revascularization. Initiate high potency statin therapy with target LDL less than 80. Smoking cessation is imperative.  Recommend Aspirin 27m daily for moderate CAD.Will not start DAPT in anticipation of CABG revascularization surgery. Will resume heparin 10 hours post procedure.    Echo 11/13/17 Study Conclusions  - Left ventricle: The cavity size was normal. Wall thickness was normal. Systolic function was normal. The estimated ejection fraction was in the range of 60% to 65%. Wall motion was normal; there were no regional wall motion abnormalities. Doppler parameters are consistent with abnormal left ventricular relaxation (grade 1 diastolic dysfunction). The E/e&' ratio is <8, suggesting normal LV filling pressure. - Left atrium: The atrium was normal in size. -  Inferior vena cava: The vessel was normal in size. The respirophasic diameter changes were in the normal range (>= 50%), consistent with normal  central venous pressure.  Impressions:  - LVEF 60-65%, normal wall thickness, normal wall motion, grade 1 DD, normal LV filling pressure, normal LA size, normal IVC.  Antimicrobials:  None   Subjective: Denies new issues. No tremor, anxiety, cravings. Grumpy about how little he is allowed to sleep with frequent interruptions.   Objective: Vitals:   11/19/17 2340 11/20/17 0400 11/20/17 0739 11/20/17 1100  BP: (!) 128/93 127/89 (!) 138/92 (!) 129/91  Pulse: 83  82 79  Resp:  (!) 28  (!) 28  Temp: 98.2 F (36.8 C) 97.8 F (36.6 C) 97.7 F (36.5 C) 98.2 F (36.8 C)  TempSrc: Oral Oral Oral Oral  SpO2: 93% 98% 96% 94%  Weight:  66.9 kg    Height:        Intake/Output Summary (Last 24 hours) at 11/20/2017 1614 Last data filed at 11/20/2017 0900 Gross per 24 hour  Intake 1008.62 ml  Output 1550 ml  Net -541.38 ml   Filed Weights   11/18/17 0606 11/19/17 0339 11/20/17 0400  Weight: 66.3 kg 67.6 kg 66.9 kg   Gen: 47 y.o. male in no distress Pulm: Nonlabored breathing room air. Clear, distant. CV: Regular rate and rhythm. No murmur, rub, or gallop. No JVD, no dependent edema. GI: Abdomen soft, non-tender, non-distended, with normoactive bowel sounds.  Ext: Warm, no deformities Skin: No rashes, lesions or ulcers on visualized skin.  Neuro: Alert and oriented. No tremor, asterixis, no focal neurological deficits. Psych: Judgement and insight appear fair. Mood euthymic & affect congruent. Behavior is appropriate. No psychomotor agitation.  Data Reviewed: I have personally reviewed following labs and imaging studies  CBC: Recent Labs  Lab 11/15/17 0422 11/16/17 0312 11/17/17 0340 11/18/17 0429 11/19/17 0333 11/20/17 0527  WBC 9.2 8.7 8.0 8.8 9.1 8.2  NEUTROABS 6.1  --   --   --   --   --   HGB 15.8 13.9 13.8 14.4 14.5 15.7  HCT 47.8 42.7 42.3 43.8 43.8 48.8  MCV 102.1* 101.9* 101.2* 102.1* 102.1* 102.3*  PLT 280 253 263 272 286 737   Basic Metabolic  Panel: Recent Labs  Lab 11/14/17 2304 11/15/17 0422 11/17/17 0340 11/19/17 0333 11/20/17 0527  NA 136 140 140 137 137  K 3.5 3.2* 3.5 3.4* 4.0  CL 101 102 104 104 104  CO2 25 26 25 25 26   GLUCOSE 72 99 88 105* 103*  BUN 5* 5* <5* 8 5*  CREATININE 1.01 1.00 0.84 0.92 0.95  CALCIUM 9.3 9.0 8.9 8.9 9.4   GFR: Estimated Creatinine Clearance: 89 mL/min (by C-G formula based on SCr of 0.95 mg/dL). Liver Function Tests: Recent Labs  Lab 11/17/17 0340 11/19/17 0333 11/20/17 0527  AST 53* 46* 45*  ALT 48* 50* 54*  ALKPHOS 68 71 80  BILITOT 0.5 0.4 0.5  PROT 5.8* 6.1* 7.0  ALBUMIN 2.7* 2.8* 3.2*   No results for input(s): LIPASE, AMYLASE in the last 168 hours. Recent Labs  Lab 11/17/17 0340  AMMONIA 41*   Coagulation Profile: Recent Labs  Lab 11/17/17 0340 11/20/17 0527  INR 0.94 0.96   Cardiac Enzymes: Recent Labs  Lab 11/15/17 0422 11/15/17 1044 11/15/17 1659  TROPONINI 0.06* 0.06* 0.05*   BNP (last 3 results) No results for input(s): PROBNP in the last 8760 hours. HbA1C: Recent Labs    11/19/17 0333  HGBA1C  5.5   CBG: Recent Labs  Lab 11/17/17 1704  GLUCAP 105*   Lipid Profile: No results for input(s): CHOL, HDL, LDLCALC, TRIG, CHOLHDL, LDLDIRECT in the last 72 hours. Thyroid Function Tests: No results for input(s): TSH, T4TOTAL, FREET4, T3FREE, THYROIDAB in the last 72 hours. Anemia Panel: No results for input(s): VITAMINB12, FOLATE, FERRITIN, TIBC, IRON, RETICCTPCT in the last 72 hours. Urine analysis:    Component Value Date/Time   COLORURINE YELLOW 11/18/2017 1130   APPEARANCEUR CLEAR 11/18/2017 1130   LABSPEC 1.012 11/18/2017 1130   PHURINE 7.0 11/18/2017 1130   GLUCOSEU NEGATIVE 11/18/2017 1130   HGBUR NEGATIVE 11/18/2017 1130   BILIRUBINUR NEGATIVE 11/18/2017 1130   KETONESUR NEGATIVE 11/18/2017 1130   PROTEINUR NEGATIVE 11/18/2017 1130   NITRITE NEGATIVE 11/18/2017 1130   LEUKOCYTESUR NEGATIVE 11/18/2017 1130   Recent Results  (from the past 240 hour(s))  Surgical pcr screen     Status: None   Collection Time: 11/15/17  3:22 AM  Result Value Ref Range Status   MRSA, PCR NEGATIVE NEGATIVE Final   Staphylococcus aureus NEGATIVE NEGATIVE Final    Comment: (NOTE) The Xpert SA Assay (FDA approved for NASAL specimens in patients 69 years of age and older), is one component of a comprehensive surveillance program. It is not intended to diagnose infection nor to guide or monitor treatment. Performed at [facility], [place] [address], [place], [place] [address]   Surgical PCR screen     Status: None   Collection Time: 11/20/17 12:15 PM  Result Value Ref Range Status   MRSA, PCR NEGATIVE NEGATIVE Final   Staphylococcus aureus NEGATIVE NEGATIVE Final    Comment: (NOTE) The Xpert SA Assay (FDA approved for NASAL specimens in patients 10 years of age and older), is one component of a comprehensive surveillance program. It is not intended to diagnose infection nor to guide or monitor treatment. Performed at [facility], [place] [address], [place], [place] [address]       Radiology Studies: No results found.  Scheduled Meds: . aspirin EC  81 mg Oral Daily  . atorvastatin  40 mg Oral q1800  . bisacodyl  5 mg Oral Once  . calcium carbonate  1 tablet Oral Daily  . chlordiazePOXIDE  5 mg Oral BID  . [START ON 11/21/2017] chlorhexidine  15 mL Mouth/Throat Once  . Chlorhexidine Gluconate Cloth  6 each Topical Once   And  . [START ON 11/21/2017] Chlorhexidine Gluconate Cloth  6 each Topical Once  . feeding supplement (ENSURE ENLIVE)  237 mL Oral BID BM  . folic acid  1 mg Oral Daily  . [START ON 11/21/2017] heparin-papaverine-plasmalyte irrigation   Irrigation To OR  . [START ON 11/21/2017] insulin   Intravenous To OR  . [START ON 11/21/2017] magnesium sulfate  40 mEq Other To OR  . metoprolol tartrate  12.5 mg Oral BID  . [START ON 11/21/2017] metoprolol tartrate  12.5 mg Oral Once  . mupirocin ointment   1 application Nasal BID  . nicotine  21 mg Transdermal Daily  . [START ON 11/21/2017] phenylephrine  30-200 mcg/min Intravenous To OR  . [START ON 11/21/2017] potassium chloride  80 mEq Other To OR  . thiamine  100 mg Oral Daily   Or  . thiamine  100 mg Intravenous Daily  . [START ON 11/21/2017] tranexamic acid  15 mg/kg Intravenous To OR  . [START ON 11/21/2017] tranexamic acid  2 mg/kg Intracatheter To OR   Continuous Infusions: Marland Kitchen [  START ON 11/21/2017] cefUROXime (ZINACEF)  IV    . [START ON 11/21/2017] cefUROXime (ZINACEF)  IV    . [START ON 11/21/2017] dexmedetomidine    . [START ON 11/21/2017] DOPamine    . [START ON 11/21/2017] epinephrine    . [START ON 11/21/2017] heparin 30,000 units/NS 1000 mL solution for CELLSAVER    . heparin 1,250 Units/hr (11/20/17 0600)  . [START ON 11/21/2017] milrinone    . nitroGLYCERIN 10 mcg/min (11/20/17 1300)  . [START ON 11/21/2017] nitroGLYCERIN    . [START ON 11/21/2017] tranexamic acid (CYKLOKAPRON) infusion ([facility])    . [START ON 11/21/2017] vancomycin       LOS: 5 days   Time spent: 25 minutes.  Patrecia Pour, MD [facility] [email or web address] Password [email or web address] 11/20/2017, 4:14 PM

## 2017-11-20 NOTE — Progress Notes (Signed)
Patient ID: Greg [name], male   DOB: 1970/09/28, 47 y.o.   MRN: [id number]      [address]       [place] [address]             [phone]                   Procedure(s) (LRB): CORONARY ARTERY BYPASS GRAFTING (CABG) (N/A) TRANSESOPHAGEAL ECHOCARDIOGRAM (TEE) (N/A)  LOS: 5 days   Subjective: Patient awake alert neurologically intact no evidence of withdrawal  Objective: Vital signs in last 24 hours: Patient Vitals for the past 24 hrs:  BP Temp Temp src Pulse Resp SpO2 Weight  11/20/17 0739 (!) 138/92 97.7 F (36.5 C) Oral 82 - 96 % -  11/20/17 0400 127/89 97.8 F (36.6 C) Oral - (!) 28 98 % 66.9 kg  11/19/17 2340 (!) 128/93 98.2 F (36.8 C) Oral 83 - 93 % -  11/19/17 2219 112/74 - - 90 - - -  11/19/17 1931 130/90 97.9 F (36.6 C) Oral 93 - 95 % -  11/19/17 1230 (!) 141/89 98.1 F (36.7 C) Oral 77 18 97 % -    Filed Weights   11/18/17 0606 11/19/17 0339 11/20/17 0400  Weight: 66.3 kg 67.6 kg 66.9 kg    Hemodynamic parameters for last 24 hours:    Intake/Output from previous day: 10/16 0701 - 10/17 0700 In: 1090.6 [P.O.:720; I.V.:370.6] Out: 700 [Urine:700] Intake/Output this shift: Total I/O In: -  Out: 525 [Urine:525]  Scheduled Meds: . aspirin EC  81 mg Oral Daily  . atorvastatin  40 mg Oral q1800  . calcium carbonate  1 tablet Oral Daily  . chlordiazePOXIDE  5 mg Oral BID  . feeding supplement (ENSURE ENLIVE)  237 mL Oral BID BM  . folic acid  1 mg Oral Daily  . metoprolol tartrate  12.5 mg Oral BID  . nicotine  21 mg Transdermal Daily  . thiamine  100 mg Oral Daily   Or  . thiamine  100 mg Intravenous Daily   Continuous Infusions: . heparin 1,250 Units/hr (11/20/17 0600)  . nitroGLYCERIN 10 mcg/min (11/20/17 0600)   PRN Meds:.albuterol, gi cocktail, Influenza vac split quadrivalent PF, morphine injection, ondansetron (ZOFRAN) IV, pneumococcal 23 valent vaccine  General appearance: alert and cooperative Neurologic:  intact Heart: regular rate and rhythm, S1, S2 normal, no murmur, click, rub or gallop Lungs: clear to auscultation bilaterally Abdomen: soft, non-tender; bowel sounds normal; no masses,  no organomegaly Extremities: extremities normal, atraumatic, no cyanosis or edema and Homans sign is negative, no sign of DVT Wound: Cath site intact  Lab Results: CBC: Recent Labs    11/19/17 0333 11/20/17 0527  WBC 9.1 8.2  HGB 14.5 15.7  HCT 43.8 48.8  PLT 286 322   BMET:  Recent Labs    11/19/17 0333 11/20/17 0527  NA 137 137  K 3.4* 4.0  CL 104 104  CO2 25 26  GLUCOSE 105* 103*  BUN 8 5*  CREATININE 0.92 0.95  CALCIUM 8.9 9.4    PT/INR:  Recent Labs    11/20/17 0527  LABPROT 12.7  INR 0.96     Assessment/Plan: S/P Procedure(s) (LRB): CORONARY ARTERY BYPASS GRAFTING (CABG) (N/A) TRANSESOPHAGEAL ECHOCARDIOGRAM (TEE) (N/A)  Pulmonary Function Diagnosis: Severe Obstructive Airways Disease FEV1 1.33 33 % predicted   patient awake alert neurologically intact Discussed with him the diagnosis of severe obstructive airway disease with FEV1 of 1.3 333% of predicted based  on his recent pulmonary function studies Plan to proceed with coronary artery bypass grafting tomorrow, with increased risk because of his pulmonary function and alcohol addiction  The goals risks and alternatives of the planned surgical procedure Procedure(s): CORONARY ARTERY BYPASS GRAFTING (CABG) (N/A) TRANSESOPHAGEAL ECHOCARDIOGRAM (TEE) (N/A)  have been discussed with the patient in detail. The risks of the procedure including death, infection, stroke, myocardial infarction, bleeding, blood transfusion, postop respiratory failure requiring ventilation have all been discussed specifically.  I have quoted Linard Millers a 5 % of perioperative mortality and a complication rate as high as 50 %. The patient's questions have been answered.ALBION WEATHERHOLTZ is willing  to proceed with the planned procedure.  Grace Isaac MD 11/20/2017 8:55 AM

## 2017-11-20 NOTE — Clinical Social Work Note (Signed)
Clinical Social Work Assessment  Patient Details  Name: Greg [name] MRN: [id number] Date of Birth: 09-02-1970  Date of referral:  11/20/17               Reason for consult:  Substance Use/ETOH Abuse                Permission sought to share information with:  Family Supports Permission granted to share information::  Yes, Verbal Permission Granted  Name::     [name] and Sister  Agency::     Relationship::  Fiance and sister  Sport and exercise psychologist Information:  [name]  Housing/Transportation Living arrangements for the past 2 months:  [place] of Information:  Patient Patient Interpreter Needed:  None Criminal Activity/Legal Involvement Pertinent to Current Situation/Hospitalization:  No - Comment as needed Significant Relationships:  Significant Other, Siblings Lives with:  Significant Other Do you feel safe going back to the place where you live?  Yes Need for family participation in patient care:  No (Coment)  Care giving concerns: CSW consulted for patient alcohol use.   Social Worker assessment / plan: CSW met with patient, sister, and patient's fiance at bedside. Patient alert and oriented and gave permission to discuss drug and alcohol use with sister and fiance present. CSW assessed patient's alcohol use. Patient  Reported he drinks 12-15 beers per day. Patient indicated he is not interested in substance use treatment resources. Patient has been in several treatment programs in the past and has not found them helpful. Patient does not want to engage in group or individual counseling for his alcohol.   Patient indicated he plans to engage in other activities, like fishing, to help him abstain from drinking. 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  Patient acknowledged the significant impact his drinking has had on his health, and wants to abstain from drinking in  order to be healthier.  CSW will sign off, as patient declining resources and no additional needs identified at this time. Please re-consult if needed.  Employment status:    Insurance information:  Self Pay (Medicaid Pending) PT Recommendations:  Not assessed at this time Information / Referral to community resources:     Patient/Family's Response to care: Patient appreciative of care.  Patient/Family's Understanding of and Emotional Response to Diagnosis, Current Treatment, and Prognosis: Patient with understanding of his condition and the impact of alcohol use on his health.  Emotional Assessment Appearance:  Appears stated age Attitude/Demeanor/Rapport:  Engaged Affect (typically observed):  Stable, Appropriate, Blunt Orientation:  Oriented to Self, Oriented to Situation, Oriented to  Time, Oriented to Place Alcohol / Substance use:  Tobacco Use, Alcohol Use Psych involvement (Current and /or in the community):  No (Comment)  Discharge Needs  Concerns to be addressed:  Discharge Planning Concerns, Substance Abuse Concerns, Care Coordination Readmission within the last 30 days:  Yes Current discharge risk:  Substance Abuse Barriers to Discharge:  Active Substance Use, Continued Medical Work up, Inadequate or no insurance   Estanislado Emms, LCSW 11/20/2017, 4:08 PM

## 2017-11-21 ENCOUNTER — Inpatient Hospital Stay (HOSPITAL_COMMUNITY): Payer: Medicaid Other | Admitting: Anesthesiology

## 2017-11-21 ENCOUNTER — Inpatient Hospital Stay (HOSPITAL_COMMUNITY): Payer: Medicaid Other

## 2017-11-21 ENCOUNTER — Encounter (HOSPITAL_COMMUNITY)
Admission: EM | Disposition: A | Discharge status: Home / Self Care | Payer: Self-pay | Source: Home / Self Care | Attending: Cardiothoracic Surgery

## 2017-11-21 ENCOUNTER — Inpatient Hospital Stay (HOSPITAL_COMMUNITY)
Admit: 2017-11-21 | Discharge: 2017-11-21 | Disposition: A | Discharge status: Home / Self Care | Payer: Medicaid Other | Attending: Cardiothoracic Surgery | Admitting: Cardiothoracic Surgery

## 2017-11-21 DIAGNOSIS — Z951 Presence of aortocoronary bypass graft: Secondary | ICD-10-CM

## 2017-11-21 HISTORY — PX: CORONARY ARTERY BYPASS GRAFT: SHX141

## 2017-11-21 HISTORY — PX: TEE WITHOUT CARDIOVERSION: SHX5443

## 2017-11-21 LAB — POCT I-STAT, CHEM 8
BUN: 7 mg/dL (ref 6–20)
BUN: 6 mg/dL (ref 6–20)
BUN: 7 mg/dL (ref 6–20)
BUN: 5 mg/dL — AB (ref 6–20)
BUN: 5 mg/dL — ABNORMAL LOW (ref 6–20)
BUN: 5 mg/dL — ABNORMAL LOW (ref 6–20)
CALCIUM ION: 1.28 mmol/L (ref 1.15–1.40)
CALCIUM ION: 1.02 mmol/L — AB (ref 1.15–1.40)
CHLORIDE: 102 mmol/L (ref 98–111)
CHLORIDE: 99 mmol/L (ref 98–111)
CHLORIDE: 101 mmol/L (ref 98–111)
CHLORIDE: 106 mmol/L (ref 98–111)
CREATININE: 0.7 mg/dL (ref 0.61–1.24)
CREATININE: 0.7 mg/dL (ref 0.61–1.24)
CREATININE: 0.7 mg/dL (ref 0.61–1.24)
CREATININE: 0.9 mg/dL (ref 0.61–1.24)
Calcium, Ion: 1.24 mmol/L (ref 1.15–1.40)
Calcium, Ion: 1.04 mmol/L — ABNORMAL LOW (ref 1.15–1.40)
Calcium, Ion: 1.07 mmol/L — ABNORMAL LOW (ref 1.15–1.40)
Calcium, Ion: 1.13 mmol/L — ABNORMAL LOW (ref 1.15–1.40)
Chloride: 104 mmol/L (ref 98–111)
Chloride: 101 mmol/L (ref 98–111)
Creatinine, Ser: 0.7 mg/dL (ref 0.61–1.24)
Creatinine, Ser: 0.7 mg/dL (ref 0.61–1.24)
GLUCOSE: 117 mg/dL — AB (ref 70–99)
Glucose, Bld: 101 mg/dL — ABNORMAL HIGH (ref 70–99)
Glucose, Bld: 103 mg/dL — ABNORMAL HIGH (ref 70–99)
Glucose, Bld: 87 mg/dL (ref 70–99)
Glucose, Bld: 95 mg/dL (ref 70–99)
Glucose, Bld: 125 mg/dL — ABNORMAL HIGH (ref 70–99)
HCT: 45 % (ref 39.0–52.0)
HCT: 42 % (ref 39.0–52.0)
HCT: 30 % — ABNORMAL LOW (ref 39.0–52.0)
HEMATOCRIT: 33 % — AB (ref 39.0–52.0)
HEMATOCRIT: 30 % — AB (ref 39.0–52.0)
HEMATOCRIT: 39 % (ref 39.0–52.0)
HEMOGLOBIN: 13.3 g/dL (ref 13.0–17.0)
Hemoglobin: 15.3 g/dL (ref 13.0–17.0)
Hemoglobin: 11.2 g/dL — ABNORMAL LOW (ref 13.0–17.0)
Hemoglobin: 14.3 g/dL (ref 13.0–17.0)
Hemoglobin: 10.2 g/dL — ABNORMAL LOW (ref 13.0–17.0)
Hemoglobin: 10.2 g/dL — ABNORMAL LOW (ref 13.0–17.0)
POTASSIUM: 4.1 mmol/L (ref 3.5–5.1)
POTASSIUM: 4.5 mmol/L (ref 3.5–5.1)
POTASSIUM: 3.8 mmol/L (ref 3.5–5.1)
POTASSIUM: 4.8 mmol/L (ref 3.5–5.1)
Potassium: 3.6 mmol/L (ref 3.5–5.1)
Potassium: 4.1 mmol/L (ref 3.5–5.1)
SODIUM: 136 mmol/L (ref 135–145)
SODIUM: 138 mmol/L (ref 135–145)
Sodium: 138 mmol/L (ref 135–145)
Sodium: 136 mmol/L (ref 135–145)
Sodium: 137 mmol/L (ref 135–145)
Sodium: 138 mmol/L (ref 135–145)
TCO2: 26 mmol/L (ref 22–32)
TCO2: 26 mmol/L (ref 22–32)
TCO2: 29 mmol/L (ref 22–32)
TCO2: 27 mmol/L (ref 22–32)
TCO2: 28 mmol/L (ref 22–32)
TCO2: 24 mmol/L (ref 22–32)

## 2017-11-21 LAB — BASIC METABOLIC PANEL
ANION GAP: 12 (ref 5–15)
BUN: 8 mg/dL (ref 6–20)
CO2: 26 mmol/L (ref 22–32)
Calcium: 9.8 mg/dL (ref 8.9–10.3)
Chloride: 99 mmol/L (ref 98–111)
Creatinine, Ser: 0.94 mg/dL (ref 0.61–1.24)
GFR calc Af Amer: >60 mL/min (ref >60)
Glucose, Bld: 78 mg/dL (ref 70–99)
POTASSIUM: 4 mmol/L (ref 3.5–5.1)
Sodium: 137 mmol/L (ref 135–145)

## 2017-11-21 LAB — CBC
HCT: 39.9 % (ref 39.0–52.0)
HEMATOCRIT: 52.5 % — AB (ref 39.0–52.0)
HEMATOCRIT: 41.3 % (ref 39.0–52.0)
HEMOGLOBIN: 16.7 g/dL (ref 13.0–17.0)
Hemoglobin: 13.4 g/dL (ref 13.0–17.0)
Hemoglobin: 12.6 g/dL — ABNORMAL LOW (ref 13.0–17.0)
MCH: 32.9 pg (ref 26.0–34.0)
MCH: 33.6 pg (ref 26.0–34.0)
MCH: 33 pg (ref 26.0–34.0)
MCHC: 31.8 g/dL (ref 30.0–36.0)
MCHC: 32.4 g/dL (ref 30.0–36.0)
MCHC: 31.6 g/dL (ref 30.0–36.0)
MCV: 103.3 fL — ABNORMAL HIGH (ref 80.0–100.0)
MCV: 103.5 fL — AB (ref 80.0–100.0)
MCV: 104.5 fL — ABNORMAL HIGH (ref 80.0–100.0)
NRBC: 0 % (ref 0.0–0.2)
Platelets: 358 10*3/uL (ref 150–400)
Platelets: 205 10*3/uL (ref 150–400)
Platelets: 241 10*3/uL (ref 150–400)
RBC: 5.08 MIL/uL (ref 4.22–5.81)
RBC: 3.99 MIL/uL — ABNORMAL LOW (ref 4.22–5.81)
RBC: 3.82 MIL/uL — ABNORMAL LOW (ref 4.22–5.81)
RDW: 12.5 % (ref 11.5–15.5)
RDW: 12.9 % (ref 11.5–15.5)
RDW: 12.9 % (ref 11.5–15.5)
WBC: 10.2 10*3/uL (ref 4.0–10.5)
WBC: 18.3 10*3/uL — AB (ref 4.0–10.5)
WBC: 17.3 10*3/uL — ABNORMAL HIGH (ref 4.0–10.5)
nRBC: 0 % (ref 0.0–0.2)
nRBC: 0 % (ref 0.0–0.2)

## 2017-11-21 LAB — POCT I-STAT 3, ART BLOOD GAS (G3+)
ACID-BASE DEFICIT: 4 mmol/L — AB (ref 0.0–2.0)
Acid-base deficit: 1 mmol/L (ref 0.0–2.0)
Acid-base deficit: 2 mmol/L (ref 0.0–2.0)
Acid-base deficit: 5 mmol/L — ABNORMAL HIGH (ref 0.0–2.0)
BICARBONATE: 24.2 mmol/L (ref 20.0–28.0)
BICARBONATE: 22.2 mmol/L (ref 20.0–28.0)
Bicarbonate: 24.1 mmol/L (ref 20.0–28.0)
Bicarbonate: 21.6 mmol/L (ref 20.0–28.0)
O2 SAT: 100 %
O2 SAT: 94 %
O2 SAT: 95 %
O2 Saturation: 97 %
PCO2 ART: 41.8 mmHg (ref 32.0–48.0)
PCO2 ART: 41.6 mmHg (ref 32.0–48.0)
PCO2 ART: 43.8 mmHg (ref 32.0–48.0)
PH ART: 7.363 (ref 7.350–7.450)
PO2 ART: 382 mmHg — AB (ref 83.0–108.0)
PO2 ART: 81 mmHg — AB (ref 83.0–108.0)
Patient temperature: 35.5
Patient temperature: 36.8
Patient temperature: 37
TCO2: 26 mmol/L (ref 22–32)
TCO2: 25 mmol/L (ref 22–32)
TCO2: 23 mmol/L (ref 22–32)
TCO2: 23 mmol/L (ref 22–32)
pCO2 arterial: 42.9 mmHg (ref 32.0–48.0)
pH, Arterial: 7.371 (ref 7.350–7.450)
pH, Arterial: 7.308 — ABNORMAL LOW (ref 7.350–7.450)
pH, Arterial: 7.313 — ABNORMAL LOW (ref 7.350–7.450)
pO2, Arterial: 90 mmHg (ref 83.0–108.0)
pO2, Arterial: 77 mmHg — ABNORMAL LOW (ref 83.0–108.0)

## 2017-11-21 LAB — HEMOGLOBIN AND HEMATOCRIT, BLOOD
HCT: 31.5 % — ABNORMAL LOW (ref 39.0–52.0)
Hemoglobin: 10.5 g/dL — ABNORMAL LOW (ref 13.0–17.0)

## 2017-11-21 LAB — POCT I-STAT 4, (NA,K, GLUC, HGB,HCT)
GLUCOSE: 134 mg/dL — AB (ref 70–99)
HEMATOCRIT: 41 % (ref 39.0–52.0)
HEMOGLOBIN: 13.9 g/dL (ref 13.0–17.0)
Potassium: 3.6 mmol/L (ref 3.5–5.1)
Sodium: 139 mmol/L (ref 135–145)

## 2017-11-21 LAB — CREATININE, SERUM
Creatinine, Ser: 0.97 mg/dL (ref 0.61–1.24)
GFR calc Af Amer: >60 mL/min (ref >60)
GFR calc non Af Amer: >60 mL/min (ref >60)

## 2017-11-21 LAB — GLUCOSE, CAPILLARY
GLUCOSE-CAPILLARY: 142 mg/dL — AB (ref 70–99)
GLUCOSE-CAPILLARY: 108 mg/dL — AB (ref 70–99)
GLUCOSE-CAPILLARY: 104 mg/dL — AB (ref 70–99)
Glucose-Capillary: 126 mg/dL — ABNORMAL HIGH (ref 70–99)
Glucose-Capillary: 122 mg/dL — ABNORMAL HIGH (ref 70–99)
Glucose-Capillary: 129 mg/dL — ABNORMAL HIGH (ref 70–99)
Glucose-Capillary: 110 mg/dL — ABNORMAL HIGH (ref 70–99)

## 2017-11-21 LAB — PLATELET COUNT: Platelets: 231 10*3/uL (ref 150–400)

## 2017-11-21 LAB — HEPARIN LEVEL (UNFRACTIONATED): Heparin Unfractionated: 0.49 IU/mL (ref 0.30–0.70)

## 2017-11-21 LAB — PROTIME-INR
INR: 1.19
Prothrombin Time: 15 seconds (ref 11.4–15.2)

## 2017-11-21 LAB — APTT: aPTT: 36 seconds (ref 24–36)

## 2017-11-21 LAB — MAGNESIUM: Magnesium: 3.5 mg/dL — ABNORMAL HIGH (ref 1.7–2.4)

## 2017-11-21 SURGERY — CORONARY ARTERY BYPASS GRAFTING (CABG)
Anesthesia: General | Site: Chest

## 2017-11-21 MED ORDER — ATORVASTATIN CALCIUM 40 MG PO TABS
40.0000 mg | ORAL_TABLET | Freq: Every day | ORAL | Status: DC
  Administered 2017-11-22 – 2017-11-25 (×3): 40 mg via ORAL
  Filled 2017-11-21 (×3): qty 1

## 2017-11-21 MED ORDER — PROPOFOL 10 MG/ML IV BOLUS
INTRAVENOUS | Status: DC | PRN
  Administered 2017-11-21: 150 mg via INTRAVENOUS

## 2017-11-21 MED ORDER — ROCURONIUM BROMIDE 10 MG/ML (PF) SYRINGE
PREFILLED_SYRINGE | INTRAVENOUS | Status: DC | PRN
  Administered 2017-11-21: 20 mg via INTRAVENOUS
  Administered 2017-11-21 (×3): 50 mg via INTRAVENOUS

## 2017-11-21 MED ORDER — LACTATED RINGERS IV SOLN
INTRAVENOUS | Status: DC
  Administered 2017-11-22: 04:00:00 via INTRAVENOUS

## 2017-11-21 MED ORDER — METOPROLOL TARTRATE 25 MG/10 ML ORAL SUSPENSION: 12.5000 mg | Freq: Two times a day (BID) | ORAL | Status: DC

## 2017-11-21 MED ORDER — SODIUM CHLORIDE 0.9% FLUSH: 3.0000 mL | INTRAVENOUS | Status: DC | PRN

## 2017-11-21 MED ORDER — HEMOSTATIC AGENTS (NO CHARGE) OPTIME
TOPICAL | Status: DC | PRN
  Administered 2017-11-21: 1 via TOPICAL
  Administered 2017-11-21: 2 via TOPICAL
  Administered 2017-11-21: 1 via TOPICAL

## 2017-11-21 MED ORDER — ROCURONIUM BROMIDE 50 MG/5ML IV SOSY
PREFILLED_SYRINGE | INTRAVENOUS | Status: AC
  Filled 2017-11-21: qty 5

## 2017-11-21 MED ORDER — METOPROLOL TARTRATE 12.5 MG HALF TABLET
12.5000 mg | ORAL_TABLET | Freq: Two times a day (BID) | ORAL | Status: DC
  Administered 2017-11-23 – 2017-11-24 (×2): 12.5 mg via ORAL
  Filled 2017-11-21 (×4): qty 1

## 2017-11-21 MED ORDER — PROPOFOL 10 MG/ML IV BOLUS
INTRAVENOUS | Status: AC
  Filled 2017-11-21: qty 20

## 2017-11-21 MED ORDER — ROCURONIUM BROMIDE 50 MG/5ML IV SOSY
PREFILLED_SYRINGE | INTRAVENOUS | Status: AC
  Filled 2017-11-21: qty 10

## 2017-11-21 MED ORDER — SODIUM CHLORIDE 0.9% FLUSH
3.0000 mL | Freq: Two times a day (BID) | INTRAVENOUS | Status: DC
  Administered 2017-11-22 – 2017-11-23 (×3): 3 mL via INTRAVENOUS

## 2017-11-21 MED ORDER — SODIUM CHLORIDE 0.9 % IV SOLN
1.5000 g | Freq: Two times a day (BID) | INTRAVENOUS | Status: DC
  Administered 2017-11-21 – 2017-11-22 (×2): 1.5 g via INTRAVENOUS
  Filled 2017-11-21 (×3): qty 1.5

## 2017-11-21 MED ORDER — LACTATED RINGERS IV SOLN
INTRAVENOUS | Status: DC | PRN
  Administered 2017-11-21: 06:00:00 via INTRAVENOUS

## 2017-11-21 MED ORDER — LEVALBUTEROL HCL 0.63 MG/3ML IN NEBU
0.6300 mg | INHALATION_SOLUTION | Freq: Three times a day (TID) | RESPIRATORY_TRACT | Status: DC
  Administered 2017-11-21 – 2017-11-22 (×3): 0.63 mg via RESPIRATORY_TRACT
  Filled 2017-11-21 (×3): qty 3

## 2017-11-21 MED ORDER — ONDANSETRON HCL 4 MG/2ML IJ SOLN
4.0000 mg | Freq: Four times a day (QID) | INTRAMUSCULAR | Status: DC | PRN
  Administered 2017-11-21 – 2017-11-22 (×3): 4 mg via INTRAVENOUS
  Filled 2017-11-21 (×2): qty 2

## 2017-11-21 MED ORDER — MORPHINE SULFATE (PF) 2 MG/ML IV SOLN
2.0000 mg | INTRAVENOUS | Status: DC | PRN
  Administered 2017-11-22 (×2): 2 mg via INTRAVENOUS
  Administered 2017-11-22: 4 mg via INTRAVENOUS
  Administered 2017-11-22: 2 mg via INTRAVENOUS
  Administered 2017-11-22 (×2): 5 mg via INTRAVENOUS
  Filled 2017-11-21: qty 1
  Filled 2017-11-21: qty 2
  Filled 2017-11-21: qty 3
  Filled 2017-11-21: qty 1
  Filled 2017-11-21: qty 2
  Filled 2017-11-21: qty 1
  Filled 2017-11-21: qty 3
  Filled 2017-11-21 (×3): qty 1

## 2017-11-21 MED ORDER — FOLIC ACID 5 MG/ML IJ SOLN
1.0000 mg | Freq: Every day | INTRAMUSCULAR | Status: DC
  Administered 2017-11-22: 1 mg via INTRAVENOUS
  Filled 2017-11-21 (×2): qty 0.2

## 2017-11-21 MED ORDER — HEPARIN SODIUM (PORCINE) 1000 UNIT/ML IJ SOLN
INTRAMUSCULAR | Status: DC | PRN
  Administered 2017-11-21: 25000 [IU] via INTRAVENOUS

## 2017-11-21 MED ORDER — OXYCODONE HCL 5 MG PO TABS
5.0000 mg | ORAL_TABLET | ORAL | Status: DC | PRN
  Administered 2017-11-21 – 2017-11-22 (×8): 10 mg via ORAL
  Administered 2017-11-23 (×3): 5 mg via ORAL
  Administered 2017-11-23 – 2017-11-25 (×5): 10 mg via ORAL
  Filled 2017-11-21: qty 1
  Filled 2017-11-21 (×6): qty 2
  Filled 2017-11-21: qty 1
  Filled 2017-11-21 (×6): qty 2
  Filled 2017-11-21: qty 1
  Filled 2017-11-21: qty 2
  Filled 2017-11-21: qty 1

## 2017-11-21 MED ORDER — ACETAMINOPHEN 650 MG RE SUPP
650.0000 mg | Freq: Once | RECTAL | Status: AC
  Administered 2017-11-21: 650 mg via RECTAL

## 2017-11-21 MED ORDER — MILRINONE LACTATE IN DEXTROSE 20-5 MG/100ML-% IV SOLN
INTRAVENOUS | Status: DC | PRN
  Administered 2017-11-21: .3 ug/kg/min via INTRAVENOUS

## 2017-11-21 MED ORDER — VANCOMYCIN HCL IN DEXTROSE 1-5 GM/200ML-% IV SOLN
1000.0000 mg | Freq: Once | INTRAVENOUS | Status: AC
  Administered 2017-11-21: 1000 mg via INTRAVENOUS
  Filled 2017-11-21: qty 200

## 2017-11-21 MED ORDER — INSULIN REGULAR BOLUS VIA INFUSION
0.0000 [IU] | Freq: Three times a day (TID) | INTRAVENOUS | Status: DC
  Filled 2017-11-21: qty 10

## 2017-11-21 MED ORDER — SODIUM CHLORIDE 0.9 % IV SOLN: 250.0000 mL | INTRAVENOUS | Status: DC

## 2017-11-21 MED ORDER — ALBUTEROL SULFATE HFA 108 (90 BASE) MCG/ACT IN AERS
INHALATION_SPRAY | RESPIRATORY_TRACT | Status: AC
  Filled 2017-11-21: qty 6.7

## 2017-11-21 MED ORDER — PHENYLEPHRINE HCL-NACL 20-0.9 MG/250ML-% IV SOLN
0.0000 ug/min | INTRAVENOUS | Status: DC
  Administered 2017-11-22: 20 ug/min via INTRAVENOUS
  Administered 2017-11-22: 15 ug/min via INTRAVENOUS
  Filled 2017-11-21 (×4): qty 250

## 2017-11-21 MED ORDER — BISACODYL 5 MG PO TBEC
10.0000 mg | DELAYED_RELEASE_TABLET | Freq: Every day | ORAL | Status: DC
  Administered 2017-11-22 – 2017-11-26 (×4): 10 mg via ORAL
  Filled 2017-11-21 (×5): qty 2

## 2017-11-21 MED ORDER — MILRINONE LACTATE IN DEXTROSE 20-5 MG/100ML-% IV SOLN
0.0000 ug/kg/min | INTRAVENOUS | Status: DC
  Administered 2017-11-22: 0.25 ug/kg/min via INTRAVENOUS
  Administered 2017-11-22: 0.3 ug/kg/min via INTRAVENOUS
  Administered 2017-11-23: 0.202 ug/kg/min via INTRAVENOUS
  Filled 2017-11-21 (×2): qty 100

## 2017-11-21 MED ORDER — ACETAMINOPHEN 160 MG/5ML PO SOLN: 650.0000 mg | Freq: Once | ORAL | Status: AC

## 2017-11-21 MED ORDER — LACTATED RINGERS IV SOLN
INTRAVENOUS | Status: DC | PRN
  Administered 2017-11-21: 07:00:00 via INTRAVENOUS

## 2017-11-21 MED ORDER — DOCUSATE SODIUM 100 MG PO CAPS
200.0000 mg | ORAL_CAPSULE | Freq: Every day | ORAL | Status: DC
  Administered 2017-11-22 – 2017-11-26 (×5): 200 mg via ORAL
  Filled 2017-11-21 (×5): qty 2

## 2017-11-21 MED ORDER — ALBUTEROL SULFATE HFA 108 (90 BASE) MCG/ACT IN AERS
INHALATION_SPRAY | RESPIRATORY_TRACT | Status: DC | PRN
  Administered 2017-11-21: 6 via RESPIRATORY_TRACT

## 2017-11-21 MED ORDER — CHLORHEXIDINE GLUCONATE 0.12 % MT SOLN
15.0000 mL | OROMUCOSAL | Status: AC
  Administered 2017-11-21: 15 mL via OROMUCOSAL

## 2017-11-21 MED ORDER — FENTANYL CITRATE (PF) 250 MCG/5ML IJ SOLN
INTRAMUSCULAR | Status: DC | PRN
  Administered 2017-11-21: 250 ug via INTRAVENOUS
  Administered 2017-11-21 (×2): 100 ug via INTRAVENOUS
  Administered 2017-11-21: 250 ug via INTRAVENOUS
  Administered 2017-11-21 (×2): 50 ug via INTRAVENOUS
  Administered 2017-11-21 (×3): 150 ug via INTRAVENOUS

## 2017-11-21 MED ORDER — INSULIN REGULAR(HUMAN) IN NACL 100-0.9 UT/100ML-% IV SOLN: INTRAVENOUS | Status: DC

## 2017-11-21 MED ORDER — FAMOTIDINE IN NACL 20-0.9 MG/50ML-% IV SOLN
20.0000 mg | Freq: Two times a day (BID) | INTRAVENOUS | Status: AC
  Administered 2017-11-21 (×2): 20 mg via INTRAVENOUS
  Filled 2017-11-21: qty 50

## 2017-11-21 MED ORDER — SODIUM CHLORIDE 0.45 % IV SOLN
INTRAVENOUS | Status: DC | PRN
  Administered 2017-11-21: 13:00:00 via INTRAVENOUS

## 2017-11-21 MED ORDER — ACETAMINOPHEN 160 MG/5ML PO SOLN: 1000.0000 mg | Freq: Four times a day (QID) | ORAL | Status: DC

## 2017-11-21 MED ORDER — ACETAMINOPHEN 500 MG PO TABS
1000.0000 mg | ORAL_TABLET | Freq: Four times a day (QID) | ORAL | Status: DC
  Administered 2017-11-21 – 2017-11-24 (×9): 1000 mg via ORAL
  Filled 2017-11-21 (×9): qty 2

## 2017-11-21 MED ORDER — PLASMA-LYTE 148 IV SOLN
INTRAVENOUS | Status: DC | PRN
  Administered 2017-11-21: 500 mL via INTRAVASCULAR

## 2017-11-21 MED ORDER — MAGNESIUM SULFATE 4 GM/100ML IV SOLN
4.0000 g | Freq: Once | INTRAVENOUS | Status: AC
  Administered 2017-11-21: 4 g via INTRAVENOUS

## 2017-11-21 MED ORDER — 0.9 % SODIUM CHLORIDE (POUR BTL) OPTIME
TOPICAL | Status: DC | PRN
  Administered 2017-11-21: 6000 mL

## 2017-11-21 MED ORDER — FENTANYL CITRATE (PF) 250 MCG/5ML IJ SOLN
INTRAMUSCULAR | Status: AC
  Filled 2017-11-21: qty 25

## 2017-11-21 MED ORDER — POTASSIUM CHLORIDE 10 MEQ/50ML IV SOLN
10.0000 meq | INTRAVENOUS | Status: AC
  Administered 2017-11-21 (×3): 10 meq via INTRAVENOUS

## 2017-11-21 MED ORDER — MORPHINE SULFATE (PF) 2 MG/ML IV SOLN
1.0000 mg | INTRAVENOUS | Status: DC | PRN
  Administered 2017-11-21: 2 mg via INTRAVENOUS
  Administered 2017-11-21: 4 mg via INTRAVENOUS
  Administered 2017-11-21 (×2): 2 mg via INTRAVENOUS
  Filled 2017-11-21: qty 1

## 2017-11-21 MED ORDER — DEXMEDETOMIDINE HCL IN NACL 200 MCG/50ML IV SOLN
0.2000 ug/kg/h | INTRAVENOUS | Status: DC
  Administered 2017-11-21: 0.2 ug/kg/h via INTRAVENOUS
  Filled 2017-11-21: qty 50

## 2017-11-21 MED ORDER — PHENYLEPHRINE 40 MCG/ML (10ML) SYRINGE FOR IV PUSH (FOR BLOOD PRESSURE SUPPORT)
PREFILLED_SYRINGE | INTRAVENOUS | Status: DC | PRN
  Administered 2017-11-21: 10 ug via INTRAVENOUS
  Administered 2017-11-21: 40 ug via INTRAVENOUS
  Administered 2017-11-21: 10 ug via INTRAVENOUS
  Administered 2017-11-21: 20 ug via INTRAVENOUS

## 2017-11-21 MED ORDER — SODIUM CHLORIDE 0.9 % IV SOLN
INTRAVENOUS | Status: DC
  Administered 2017-11-21: 13:00:00 via INTRAVENOUS

## 2017-11-21 MED ORDER — ASPIRIN EC 325 MG PO TBEC
325.0000 mg | DELAYED_RELEASE_TABLET | Freq: Every day | ORAL | Status: DC
  Administered 2017-11-22 – 2017-11-26 (×5): 325 mg via ORAL
  Filled 2017-11-21 (×5): qty 1

## 2017-11-21 MED ORDER — MAGNESIUM SULFATE 4 GM/100ML IV SOLN
INTRAVENOUS | Status: AC
  Filled 2017-11-21: qty 100

## 2017-11-21 MED ORDER — MIDAZOLAM HCL 2 MG/2ML IJ SOLN: 2.0000 mg | INTRAMUSCULAR | Status: DC | PRN

## 2017-11-21 MED ORDER — MIDAZOLAM HCL 10 MG/2ML IJ SOLN
INTRAMUSCULAR | Status: AC
  Filled 2017-11-21: qty 2

## 2017-11-21 MED ORDER — ALBUMIN HUMAN 5 % IV SOLN
250.0000 mL | INTRAVENOUS | Status: AC | PRN
  Administered 2017-11-21 (×3): 12.5 g via INTRAVENOUS
  Filled 2017-11-21: qty 250

## 2017-11-21 MED ORDER — BISACODYL 10 MG RE SUPP: 10.0000 mg | Freq: Every day | RECTAL | Status: DC

## 2017-11-21 MED ORDER — LACTATED RINGERS IV SOLN: INTRAVENOUS | Status: DC

## 2017-11-21 MED ORDER — LACTATED RINGERS IV SOLN: 500.0000 mL | Freq: Once | INTRAVENOUS | Status: DC | PRN

## 2017-11-21 MED ORDER — PHENYLEPHRINE 40 MCG/ML (10ML) SYRINGE FOR IV PUSH (FOR BLOOD PRESSURE SUPPORT)
PREFILLED_SYRINGE | INTRAVENOUS | Status: AC
  Filled 2017-11-21: qty 10

## 2017-11-21 MED ORDER — METOPROLOL TARTRATE 5 MG/5ML IV SOLN: 2.5000 mg | INTRAVENOUS | Status: DC | PRN

## 2017-11-21 MED ORDER — ARTIFICIAL TEARS OPHTHALMIC OINT
TOPICAL_OINTMENT | OPHTHALMIC | Status: AC
  Filled 2017-11-21: qty 3.5

## 2017-11-21 MED ORDER — ALBUMIN HUMAN 5 % IV SOLN
INTRAVENOUS | Status: DC | PRN
  Administered 2017-11-21: 12:00:00 via INTRAVENOUS

## 2017-11-21 MED ORDER — HEPARIN SODIUM (PORCINE) 1000 UNIT/ML IJ SOLN
INTRAMUSCULAR | Status: AC
  Filled 2017-11-21: qty 1

## 2017-11-21 MED ORDER — NITROGLYCERIN IN D5W 200-5 MCG/ML-% IV SOLN: 0.0000 ug/min | INTRAVENOUS | Status: DC

## 2017-11-21 MED ORDER — PANTOPRAZOLE SODIUM 40 MG PO TBEC
40.0000 mg | DELAYED_RELEASE_TABLET | Freq: Every day | ORAL | Status: DC
  Administered 2017-11-23 – 2017-11-26 (×4): 40 mg via ORAL
  Filled 2017-11-21 (×4): qty 1

## 2017-11-21 MED ORDER — NICOTINE 21 MG/24HR TD PT24
21.0000 mg | MEDICATED_PATCH | Freq: Every day | TRANSDERMAL | Status: DC
  Administered 2017-11-22 – 2017-11-26 (×5): 21 mg via TRANSDERMAL
  Filled 2017-11-21 (×5): qty 1

## 2017-11-21 MED ORDER — MIDAZOLAM HCL 5 MG/5ML IJ SOLN
INTRAMUSCULAR | Status: DC | PRN
  Administered 2017-11-21: 1 mg via INTRAVENOUS
  Administered 2017-11-21: 2 mg via INTRAVENOUS
  Administered 2017-11-21: 1 mg via INTRAVENOUS
  Administered 2017-11-21: 4 mg via INTRAVENOUS
  Administered 2017-11-21 (×2): 1 mg via INTRAVENOUS

## 2017-11-21 MED ORDER — PROTAMINE SULFATE 10 MG/ML IV SOLN
INTRAVENOUS | Status: DC | PRN
  Administered 2017-11-21: 250 mg via INTRAVENOUS

## 2017-11-21 MED ORDER — TRAMADOL HCL 50 MG PO TABS
50.0000 mg | ORAL_TABLET | ORAL | Status: DC | PRN
  Administered 2017-11-21 – 2017-11-23 (×7): 100 mg via ORAL
  Administered 2017-11-24: 50 mg via ORAL
  Administered 2017-11-24: 100 mg via ORAL
  Administered 2017-11-24 (×2): 50 mg via ORAL
  Administered 2017-11-25 (×2): 100 mg via ORAL
  Filled 2017-11-21 (×4): qty 2
  Filled 2017-11-21 (×2): qty 1
  Filled 2017-11-21 (×3): qty 2
  Filled 2017-11-21: qty 1
  Filled 2017-11-21 (×4): qty 2

## 2017-11-21 MED ORDER — ASPIRIN 81 MG PO CHEW: 324.0000 mg | CHEWABLE_TABLET | Freq: Every day | ORAL | Status: DC

## 2017-11-21 SURGICAL SUPPLY — 71 items
BAG DECANTER FOR FLEXI CONT (MISCELLANEOUS) ×3 IMPLANT
BANDAGE ACE 4X5 VEL STRL LF (GAUZE/BANDAGES/DRESSINGS) ×3 IMPLANT
BANDAGE ACE 6X5 VEL STRL LF (GAUZE/BANDAGES/DRESSINGS) ×3 IMPLANT
BLADE STERNUM SYSTEM 6 (BLADE) ×3 IMPLANT
BLADE SURG 11 STRL SS (BLADE) ×1 IMPLANT
BNDG GAUZE ELAST 4 BULKY (GAUZE/BANDAGES/DRESSINGS) ×3 IMPLANT
CANISTER SUCT 3000ML PPV (MISCELLANEOUS) ×3 IMPLANT
CATH CPB KIT GERHARDT (MISCELLANEOUS) ×3 IMPLANT
CATH THORACIC 28FR (CATHETERS) ×3 IMPLANT
COVER MAYO STAND STRL (DRAPES) ×1 IMPLANT
COVER WAND RF STERILE (DRAPES) ×2 IMPLANT
CRADLE DONUT ADULT HEAD (MISCELLANEOUS) ×3 IMPLANT
DRAIN CHANNEL 28F RND 3/8 FF (WOUND CARE) ×3 IMPLANT
DRAPE CARDIOVASCULAR INCISE (DRAPES) ×3
DRAPE SLUSH/WARMER DISC (DRAPES) ×3 IMPLANT
DRAPE SRG 135X102X78XABS (DRAPES) ×2 IMPLANT
DRSG AQUACEL AG ADV 3.5X14 (GAUZE/BANDAGES/DRESSINGS) ×3 IMPLANT
ELECT BLADE 4.0 EZ CLEAN MEGAD (MISCELLANEOUS) ×3
ELECT REM PT RETURN 9FT ADLT (ELECTROSURGICAL) ×6
ELECTRODE BLDE 4.0 EZ CLN MEGD (MISCELLANEOUS) ×2 IMPLANT
ELECTRODE REM PT RTRN 9FT ADLT (ELECTROSURGICAL) ×4 IMPLANT
FELT TEFLON 1X6 (MISCELLANEOUS) ×5 IMPLANT
GAUZE SPONGE 4X4 12PLY STRL (GAUZE/BANDAGES/DRESSINGS) ×6 IMPLANT
GLOVE BIO SURGEON STRL SZ 6.5 (GLOVE) ×14 IMPLANT
GOWN STRL REUS W/ TWL LRG LVL3 (GOWN DISPOSABLE) ×8 IMPLANT
GOWN STRL REUS W/TWL LRG LVL3 (GOWN DISPOSABLE) ×30
HEMOSTAT POWDER SURGIFOAM 1G (HEMOSTASIS) ×6 IMPLANT
HEMOSTAT SURGICEL 2X14 (HEMOSTASIS) ×4 IMPLANT
KIT BASIN OR (CUSTOM PROCEDURE TRAY) ×3 IMPLANT
KIT CATH SUCT 8FR (CATHETERS) ×3 IMPLANT
KIT SUCTION CATH 14FR (SUCTIONS) ×6 IMPLANT
KIT TURNOVER KIT B (KITS) ×3 IMPLANT
KIT VASOVIEW ACCESSORY VH 2004 (KITS) ×1 IMPLANT
KIT VASOVIEW HEMOPRO VH 3000 (KITS) ×2 IMPLANT
KIT VASOVIEW W/TROCAR VH 2000 (KITS) ×1 IMPLANT
LEAD PACING MYOCARDI (MISCELLANEOUS) ×3 IMPLANT
LINE EXTENSION DELIVERY (MISCELLANEOUS) ×1 IMPLANT
MARKER GRAFT CORONARY BYPASS (MISCELLANEOUS) ×9 IMPLANT
NS IRRIG 1000ML POUR BTL (IV SOLUTION) ×16 IMPLANT
PACK E OPEN HEART (SUTURE) ×3 IMPLANT
PACK OPEN HEART (CUSTOM PROCEDURE TRAY) ×3 IMPLANT
PAD ARMBOARD 7.5X6 YLW CONV (MISCELLANEOUS) ×6 IMPLANT
PAD ELECT DEFIB RADIOL ZOLL (MISCELLANEOUS) ×3 IMPLANT
PENCIL BUTTON HOLSTER BLD 10FT (ELECTRODE) ×3 IMPLANT
POWDER SURGICEL 3.0 GRAM (HEMOSTASIS) ×2 IMPLANT
PUNCH AORTIC ROTATE  4.5MM 8IN (MISCELLANEOUS) ×1 IMPLANT
SET CARDIOPLEGIA MPS 5001102 (MISCELLANEOUS) ×1 IMPLANT
SOLUTION ANTI FOG 6CC (MISCELLANEOUS) ×1 IMPLANT
SUT BONE WAX W31G (SUTURE) ×3 IMPLANT
SUT MNCRL AB 4-0 PS2 18 (SUTURE) ×1 IMPLANT
SUT PROLENE 3 0 SH1 36 (SUTURE) ×3 IMPLANT
SUT PROLENE 4 0 TF (SUTURE) ×6 IMPLANT
SUT PROLENE 6 0 C 1 30 (SUTURE) ×2 IMPLANT
SUT PROLENE 6 0 CC (SUTURE) ×6 IMPLANT
SUT PROLENE 7 0 BV1 MDA (SUTURE) ×4 IMPLANT
SUT PROLENE 7.0 RB 3 (SUTURE) ×1 IMPLANT
SUT PROLENE 8 0 BV175 6 (SUTURE) ×2 IMPLANT
SUT STEEL 6MS V (SUTURE) ×3 IMPLANT
SUT STEEL SZ 6 DBL 3X14 BALL (SUTURE) ×3 IMPLANT
SUT VIC AB 1 CTX 18 (SUTURE) ×6 IMPLANT
SUT VIC AB 2-0 CT1 27 (SUTURE) ×3
SUT VIC AB 2-0 CT1 TAPERPNT 27 (SUTURE) IMPLANT
SYSTEM SAHARA CHEST DRAIN ATS (WOUND CARE) ×3 IMPLANT
TAPE CLOTH SURG 4X10 WHT LF (GAUZE/BANDAGES/DRESSINGS) ×1 IMPLANT
TAPE PAPER 2X10 WHT MICROPORE (GAUZE/BANDAGES/DRESSINGS) ×1 IMPLANT
TOWEL GREEN STERILE (TOWEL DISPOSABLE) ×3 IMPLANT
TOWEL GREEN STERILE FF (TOWEL DISPOSABLE) ×3 IMPLANT
TRAY FOLEY SLVR 16FR TEMP STAT (SET/KITS/TRAYS/PACK) ×3 IMPLANT
TUBING INSUFFLATION (TUBING) ×4 IMPLANT
UNDERPAD 30X30 (UNDERPADS AND DIAPERS) ×3 IMPLANT
WATER STERILE IRR 1000ML POUR (IV SOLUTION) ×6 IMPLANT

## 2017-11-21 NOTE — Progress Notes (Signed)
Patient not examined or interviewed prior to call to OR this morning for CABG. Completed librium taper and had no evidence of withdrawal. Anticipate disposition to ICU on CCM or cardiothoracic surgery service.  Please contact hospitalist service if questions arise.   Vance Gather, MD 11/21/2017 11:20 AM

## 2017-11-21 NOTE — Anesthesia Procedure Notes (Signed)
Arterial Line Insertion Start/End10/18/2019 6:45 AM Performed by: Alain Marion, CRNA, CRNA  Patient location: Pre-op. Preanesthetic checklist: patient identified, IV checked, site marked, risks and benefits discussed, surgical consent, monitors and equipment checked, pre-op evaluation, timeout performed and anesthesia consent Lidocaine 1% used for infiltration Left, radial was placed Catheter size: 20 G Hand hygiene performed  and maximum sterile barriers used   Attempts: 1 Procedure performed without using ultrasound guided technique. Ultrasound Notes:anatomy identified, needle tip was noted to be adjacent to the nerve/plexus identified and no ultrasound evidence of intravascular and/or intraneural injection Following insertion, dressing applied and Biopatch. Post procedure assessment: normal  Patient tolerated the procedure well with no immediate complications.

## 2017-11-21 NOTE — Anesthesia Preprocedure Evaluation (Signed)
Anesthesia Evaluation  Patient identified by MRN, date of birth, ID band Patient awake    Reviewed: Allergy & Precautions, H&P , NPO status , Patient's Chart, lab work & pertinent test results  Airway Mallampati: II   Neck ROM: full    Dental   Pulmonary COPD, Current Smoker,    breath sounds clear to auscultation       Cardiovascular + angina + CAD and + Past MI   Rhythm:regular Rate:Normal     Neuro/Psych  Headaches,    GI/Hepatic GERD  ,  Endo/Other    Renal/GU      Musculoskeletal   Abdominal   Peds  Hematology   Anesthesia Other Findings   Reproductive/Obstetrics                             Anesthesia Physical Anesthesia Plan  ASA: III  Anesthesia Plan: General   Post-op Pain Management:    Induction: Intravenous  PONV Risk Score and Plan: 1 and Ondansetron, Dexamethasone, Midazolam and Treatment may vary due to age or medical condition  Airway Management Planned: Oral ETT  Additional Equipment: Arterial line, CVP, PA Cath, TEE and Ultrasound Guidance Line Placement  Intra-op Plan:   Post-operative Plan: Post-operative intubation/ventilation  Informed Consent: I have reviewed the patients History and Physical, chart, labs and discussed the procedure including the risks, benefits and alternatives for the proposed anesthesia with the patient or authorized representative who has indicated his/her understanding and acceptance.     Plan Discussed with: CRNA, Anesthesiologist and Surgeon  Anesthesia Plan Comments:         Anesthesia Quick Evaluation

## 2017-11-21 NOTE — Progress Notes (Signed)
TCTS BRIEF SICU PROGRESS NOTE  Day of Surgery  S/P Procedure(s) (LRB): CORONARY ARTERY BYPASS GRAFTING (CABG) times 3 using left internal mammary artery and right greater saphenous vein harvested endscopically. LIMA to LAD, SVG to Distal Right, SVG to Circ. (N/A) TRANSESOPHAGEAL ECHOCARDIOGRAM (TEE) (N/A)   Extubated uneventfully, breathing comfortably NSR w/ stable hemodynamics Chest tube output low UOP > 100 mL/hr Labs okay  Plan: Continue routine early postop  Rexene Alberts, MD 11/21/2017 5:30 PM

## 2017-11-21 NOTE — Transfer of Care (Signed)
Immediate Anesthesia Transfer of Care Note  Patient: Greg [name]  Procedure(s) Performed: CORONARY ARTERY BYPASS GRAFTING (CABG) times 3 using left internal mammary artery and right greater saphenous vein harvested endscopically. LIMA to LAD, SVG to Distal Right, SVG to Circ. (N/A Chest) TRANSESOPHAGEAL ECHOCARDIOGRAM (TEE) (N/A )  Patient Location: SICU  Anesthesia Type:General  Level of Consciousness: Patient remains intubated per anesthesia plan  Airway & Oxygen Therapy: Patient remains intubated per anesthesia plan and Patient placed on Ventilator (see vital sign flow sheet for setting)  Post-op Assessment: Report given to RN and Post -op Vital signs reviewed and stable  Post vital signs: Reviewed and stable  Last Vitals:  Vitals Value Taken Time  BP 117/88 11/21/2017  1:30 PM  Temp 35.6 C 11/21/2017  1:36 PM  Pulse 89 11/21/2017  1:36 PM  Resp 18 11/21/2017  1:36 PM  SpO2 99 % 11/21/2017  1:36 PM  Vitals shown include unvalidated device data.  Last Pain:  Vitals:   11/21/17 0447  TempSrc: Oral  PainSc:       Patients Stated Pain Goal: 0 (11/64/35 3912)  Complications: No apparent anesthesia complications

## 2017-11-21 NOTE — Anesthesia Procedure Notes (Signed)
Procedure Name: Intubation Date/Time: 11/21/2017 7:33 AM Performed by: Alain Marion, CRNA Pre-anesthesia Checklist: Patient identified, Emergency Drugs available, Suction available and Patient being monitored Patient Re-evaluated:Patient Re-evaluated prior to induction Oxygen Delivery Method: Circle System Utilized Preoxygenation: Pre-oxygenation with 100% oxygen Induction Type: IV induction Ventilation: Mask ventilation without difficulty and Oral airway inserted - appropriate to patient size Laryngoscope Size: Sabra Heck and 2 Grade View: Grade I Tube type: Oral Tube size: 8.0 mm Number of attempts: 1 Airway Equipment and Method: Stylet and Oral airway Placement Confirmation: ETT inserted through vocal cords under direct vision,  positive ETCO2 and breath sounds checked- equal and bilateral Secured at: 22 cm Tube secured with: Tape Dental Injury: Teeth and Oropharynx as per pre-operative assessment

## 2017-11-21 NOTE — Procedures (Signed)
Extubation Procedure Note  Patient Details:   Name: Greg [name] DOB: 01/23/1971 MRN: [id number]   Airway Documentation:    Vent end date: 11/21/17 Vent end time: 1637   Evaluation  O2 sats: stable throughout Complications: No apparent complications Patient did tolerate procedure well. Bilateral Breath Sounds: Diminished   Yes   Patient extubated to [place]. Vitals stable throughout. No complications.   NIF-35 VC-1.6L  RT will continue to monitor.  Mcneil Sober 11/21/2017, 4:53 PM

## 2017-11-21 NOTE — Progress Notes (Signed)
      [address]       [place] [address]             [phone]     Pre Procedure note for inpatients:   Greg [name] has been scheduled for Procedure(s): CORONARY ARTERY BYPASS GRAFTING (CABG) (N/A) TRANSESOPHAGEAL ECHOCARDIOGRAM (TEE) (N/A) today. The various methods of treatment have been discussed with the patient. After consideration of the risks, benefits and treatment options the patient has consented to the planned procedure.   The patient has been seen and labs reviewed. There are no changes in the patient's condition to prevent proceeding with the planned procedure today.  Recent labs:  Lab Results  Component Value Date   WBC 10.2 11/21/2017   HGB 16.7 11/21/2017   HCT 52.5 (H) 11/21/2017   PLT 358 11/21/2017   GLUCOSE 78 11/21/2017   CHOL 204 (H) 11/14/2017   TRIG 76 11/14/2017   HDL 75 11/14/2017   LDLCALC 114 (H) 11/14/2017   ALT 54 (H) 11/20/2017   AST 45 (H) 11/20/2017   NA 137 11/21/2017   K 4.0 11/21/2017   CL 99 11/21/2017   CREATININE 0.94 11/21/2017   BUN 8 11/21/2017   CO2 26 11/21/2017   INR 0.96 11/20/2017   HGBA1C 5.5 11/19/2017    Grace Isaac, MD 11/21/2017 7:17 AM

## 2017-11-21 NOTE — Anesthesia Procedure Notes (Signed)
Central Venous Catheter Insertion Performed by: Roderic Palau, MD, anesthesiologist Start/End10/18/2019 6:35 AM, 11/21/2017 6:50 AM Patient location: Pre-op. Preanesthetic checklist: patient identified, IV checked, site marked, risks and benefits discussed, surgical consent, monitors and equipment checked, pre-op evaluation, timeout performed and anesthesia consent Hand hygiene performed  and maximum sterile barriers used  PA cath was placed.Swan type:thermodilution PA Cath depth:50 Procedure performed without using ultrasound guided technique. Attempts: 1 Patient tolerated the procedure well with no immediate complications.

## 2017-11-21 NOTE — Progress Notes (Addendum)
  Echocardiogram Echocardiogram Transesophageal has been performed.  Greg [name] [name] 11/21/2017, 8:37 AM

## 2017-11-21 NOTE — OR Nursing (Signed)
Incorrect count at end of CABG. Room searched, MD notified and XRay taken. No retained needle per Lonna Duval, MD.

## 2017-11-21 NOTE — Brief Op Note (Addendum)
      [address]       [place] [address]             [phone]       11/21/2017  12:59 PM  PATIENT:  Greg [name]  47 y.o. male  PRE-OPERATIVE DIAGNOSIS:  CAD  POST-OPERATIVE DIAGNOSIS:  CAD   PROCEDURE:  Procedure(s): CORONARY ARTERY BYPASS GRAFTING (CABG) times 3 using left internal mammary artery and right greater saphenous vein harvested endscopically. LIMA to LAD, SVG to Distal Right, SVG to Circ. (N/A) TRANSESOPHAGEAL ECHOCARDIOGRAM (TEE) (N/A)  LIMA to LAD SVG to circ SVG to distal RCA   SURGEON:  Surgeon(s) and Role:    * Grace Isaac, MD - Primary  PHYSICIAN ASSISTANT:  Nicholes Rough, PA-C   ANESTHESIA:   general  EBL:  900 mL   BLOOD ADMINISTERED:none  DRAINS: ROUTINE   LOCAL MEDICATIONS USED:  NONE  SPECIMEN:  No Specimen  DISPOSITION OF SPECIMEN:  N/A  COUNTS:  YES  DICTATION: .Dragon Dictation  PLAN OF CARE: Admit to inpatient   PATIENT DISPOSITION:  ICU - intubated and hemodynamically stable.   Delay start of Pharmacological VTE agent (>24hrs) due to surgical blood loss or risk of bleeding: yes

## 2017-11-21 NOTE — Anesthesia Procedure Notes (Signed)
Central Venous Catheter Insertion Performed by: Roderic Palau, MD, anesthesiologist Start/End10/18/2019 6:35 AM, 11/21/2017 6:50 AM Patient location: Pre-op. Preanesthetic checklist: patient identified, IV checked, site marked, risks and benefits discussed, surgical consent, monitors and equipment checked, pre-op evaluation, timeout performed and anesthesia consent Position: Trendelenburg Lidocaine 1% used for infiltration and patient sedated Hand hygiene performed , maximum sterile barriers used  and Seldinger technique used Catheter size: 9 Fr Total catheter length 10. Central line was placed.MAC introducer Procedure performed using ultrasound guided technique. Ultrasound Notes:anatomy identified, needle tip was noted to be adjacent to the nerve/plexus identified, no ultrasound evidence of intravascular and/or intraneural injection and image(s) printed for medical record Attempts: 1 Following insertion, line sutured, dressing applied and Biopatch. Post procedure assessment: blood return through all ports, free fluid flow and no air  Patient tolerated the procedure well with no immediate complications.

## 2017-11-22 ENCOUNTER — Inpatient Hospital Stay (HOSPITAL_COMMUNITY): Payer: Medicaid Other

## 2017-11-22 LAB — CBC
HEMATOCRIT: 39.2 % (ref 39.0–52.0)
HEMATOCRIT: 37.9 % — AB (ref 39.0–52.0)
HEMOGLOBIN: 12.6 g/dL — AB (ref 13.0–17.0)
HEMOGLOBIN: 12.3 g/dL — AB (ref 13.0–17.0)
MCH: 33.7 pg (ref 26.0–34.0)
MCH: 34 pg (ref 26.0–34.0)
MCHC: 32.1 g/dL (ref 30.0–36.0)
MCHC: 32.5 g/dL (ref 30.0–36.0)
MCV: 104.8 fL — AB (ref 80.0–100.0)
MCV: 104.7 fL — ABNORMAL HIGH (ref 80.0–100.0)
NRBC: 0 % (ref 0.0–0.2)
Platelets: 218 10*3/uL (ref 150–400)
Platelets: 207 10*3/uL (ref 150–400)
RBC: 3.74 MIL/uL — AB (ref 4.22–5.81)
RBC: 3.62 MIL/uL — AB (ref 4.22–5.81)
RDW: 12.9 % (ref 11.5–15.5)
RDW: 12.7 % (ref 11.5–15.5)
WBC: 14.5 10*3/uL — ABNORMAL HIGH (ref 4.0–10.5)
WBC: 18 10*3/uL — ABNORMAL HIGH (ref 4.0–10.5)
nRBC: 0 % (ref 0.0–0.2)

## 2017-11-22 LAB — GLUCOSE, CAPILLARY
GLUCOSE-CAPILLARY: 92 mg/dL (ref 70–99)
Glucose-Capillary: 124 mg/dL — ABNORMAL HIGH (ref 70–99)
Glucose-Capillary: 129 mg/dL — ABNORMAL HIGH (ref 70–99)
Glucose-Capillary: 135 mg/dL — ABNORMAL HIGH (ref 70–99)
Glucose-Capillary: 53 mg/dL — ABNORMAL LOW (ref 70–99)
Glucose-Capillary: 90 mg/dL (ref 70–99)

## 2017-11-22 LAB — BASIC METABOLIC PANEL
Anion gap: 8 (ref 5–15)
BUN: 6 mg/dL (ref 6–20)
CHLORIDE: 103 mmol/L (ref 98–111)
CO2: 22 mmol/L (ref 22–32)
CREATININE: 0.97 mg/dL (ref 0.61–1.24)
Calcium: 8.1 mg/dL — ABNORMAL LOW (ref 8.9–10.3)
GFR calc Af Amer: >60 mL/min (ref >60)
GFR calc non Af Amer: >60 mL/min (ref >60)
GLUCOSE: 103 mg/dL — AB (ref 70–99)
Potassium: 4.3 mmol/L (ref 3.5–5.1)
SODIUM: 133 mmol/L — AB (ref 135–145)

## 2017-11-22 LAB — CREATININE, SERUM
Creatinine, Ser: 1.09 mg/dL (ref 0.61–1.24)
GFR calc Af Amer: >60 mL/min (ref >60)
GFR calc non Af Amer: >60 mL/min (ref >60)

## 2017-11-22 LAB — POCT I-STAT, CHEM 8
BUN: 7 mg/dL (ref 6–20)
CREATININE: 1 mg/dL (ref 0.61–1.24)
Calcium, Ion: 1.21 mmol/L (ref 1.15–1.40)
Chloride: 100 mmol/L (ref 98–111)
Glucose, Bld: 147 mg/dL — ABNORMAL HIGH (ref 70–99)
HEMATOCRIT: 35 % — AB (ref 39.0–52.0)
Hemoglobin: 11.9 g/dL — ABNORMAL LOW (ref 13.0–17.0)
Potassium: 4 mmol/L (ref 3.5–5.1)
Sodium: 134 mmol/L — ABNORMAL LOW (ref 135–145)
TCO2: 25 mmol/L (ref 22–32)

## 2017-11-22 LAB — MAGNESIUM
Magnesium: 2.6 mg/dL — ABNORMAL HIGH (ref 1.7–2.4)
Magnesium: 2.4 mg/dL (ref 1.7–2.4)

## 2017-11-22 MED ORDER — INSULIN ASPART 100 UNIT/ML ~~LOC~~ SOLN
0.0000 [IU] | SUBCUTANEOUS | Status: DC
  Administered 2017-11-23: 2 [IU] via SUBCUTANEOUS

## 2017-11-22 MED ORDER — FUROSEMIDE 10 MG/ML IJ SOLN
20.0000 mg | Freq: Two times a day (BID) | INTRAMUSCULAR | Status: DC
  Administered 2017-11-22 – 2017-11-26 (×10): 20 mg via INTRAVENOUS
  Filled 2017-11-22 (×9): qty 2

## 2017-11-22 MED ORDER — LEVALBUTEROL HCL 0.63 MG/3ML IN NEBU
0.6300 mg | INHALATION_SOLUTION | Freq: Four times a day (QID) | RESPIRATORY_TRACT | Status: DC | PRN
  Administered 2017-11-22: 0.63 mg via RESPIRATORY_TRACT
  Filled 2017-11-22: qty 3

## 2017-11-22 MED ORDER — KETOROLAC TROMETHAMINE 15 MG/ML IJ SOLN
15.0000 mg | Freq: Four times a day (QID) | INTRAMUSCULAR | Status: AC
  Administered 2017-11-22 – 2017-11-23 (×5): 15 mg via INTRAVENOUS
  Filled 2017-11-22 (×4): qty 1

## 2017-11-22 MED ORDER — ENOXAPARIN SODIUM 40 MG/0.4ML ~~LOC~~ SOLN
40.0000 mg | Freq: Every day | SUBCUTANEOUS | Status: DC
  Administered 2017-11-22 – 2017-11-25 (×4): 40 mg via SUBCUTANEOUS
  Filled 2017-11-22 (×4): qty 0.4

## 2017-11-22 MED ORDER — SODIUM CHLORIDE 0.9 % IV SOLN
1.5000 g | Freq: Two times a day (BID) | INTRAVENOUS | Status: AC
  Administered 2017-11-22 (×2): 1.5 g via INTRAVENOUS
  Filled 2017-11-22 (×2): qty 1.5

## 2017-11-22 NOTE — Progress Notes (Addendum)
[address]       [place], [address]             [phone]        CARDIOTHORACIC SURGERY PROGRESS NOTE   R1 Day Post-Op Procedure(s) (LRB): CORONARY ARTERY BYPASS GRAFTING (CABG) times 3 using left internal mammary artery and right greater saphenous vein harvested endscopically. LIMA to LAD, SVG to Distal Right, SVG to Circ. (N/A) TRANSESOPHAGEAL ECHOCARDIOGRAM (TEE) (N/A)  Subjective: Feels sore in chest.  No SOB.  No nausea  Objective: Vital signs: BP Readings from Last 1 Encounters:  11/22/17 (!) 85/72   Pulse Readings from Last 1 Encounters:  11/22/17 89   Resp Readings from Last 1 Encounters:  11/22/17 (!) 22   Temp Readings from Last 1 Encounters:  11/22/17 98.4 F (36.9 C)    Hemodynamics: PAP: (18-48)/(7-30) 46/24 CO:  [3.6 L/min-4.1 L/min] 3.8 L/min CI:  [2 L/min/m2-2.3 L/min/m2] 2.1 L/min/m2  Physical Exam:  Rhythm:   sinus  Breath sounds: clear  Heart sounds:  RRR  Incisions:  Dressings dry, intact  Abdomen:  Soft, non-distended, non-tender  Extremities:  Warm, well-perfused  Chest tubes:  low volume thin serosanguinous output, no air leak    Intake/Output from previous day: 10/18 0701 - 10/19 0700 In: 5985.2 [P.O.:1140; I.V.:2796.4; Blood:632; IV Piggyback:1416.8] Out: 3665 [Urine:2470; Emesis/NG output:5; Blood:900; Chest Tube:290] Intake/Output this shift: Total I/O In: 68.6 [I.V.:68.6] Out: -   Lab Results:  CBC: Recent Labs    11/21/17 1846 11/22/17 0413  WBC 17.3* 14.5*  HGB 12.6* 12.6*  HCT 39.9 39.2  PLT 241 218    BMET:  Recent Labs    11/21/17 0350  11/21/17 1841 11/21/17 1846 11/22/17 0413  NA 137   < > 138  --  133*  K 4.0   < > 4.8  --  4.3  CL 99   < > 106  --  103  CO2 26  --   --   --  22  GLUCOSE 78   < > 125*  --  103*  BUN 8   < > 5*  --  6  CREATININE 0.94   < > 0.90 0.97 0.97  CALCIUM 9.8  --   --   --  8.1*   < > = values in this interval not displayed.     PT/INR:     Recent Labs    11/21/17 1334  LABPROT 15.0  INR 1.19    CBG (last 3)  Recent Labs    11/21/17 2309 11/22/17 0006 11/22/17 0811  GLUCAP 104* 92 90    ABG    Component Value Date/Time   PHART 7.313 (L) 11/21/2017 1743   PCO2ART 43.8 11/21/2017 1743   PO2ART 81.0 (L) 11/21/2017 1743   HCO3 22.2 11/21/2017 1743   TCO2 24 11/21/2017 1841   ACIDBASEDEF 4.0 (H) 11/21/2017 1743   O2SAT 95.0 11/21/2017 1743    CXR: PORTABLE CHEST 1 VIEW  COMPARISON:  11/21/2017  FINDINGS: Stable position of the mediastinal and left chest tube. Negative for pneumothorax. Endotracheal tube and nasogastric tube removed. Pulmonary artery catheter is in the proximal right pulmonary artery region. Few densities at the medial right lung base are suggestive for atelectasis. Otherwise, the lungs are clear. Post CABG changes in the chest.  IMPRESSION: Stable position of the chest drains.  Negative for pneumothorax.  Mild atelectasis.   Electronically Signed   By: Scherrie Gerlach.D.  On: 11/22/2017 08:46   EKG: NSR w/out acute ischemic changes    Assessment/Plan: S/P Procedure(s) (LRB): CORONARY ARTERY BYPASS GRAFTING (CABG) times 3 using left internal mammary artery and right greater saphenous vein harvested endscopically. LIMA to LAD, SVG to Distal Right, SVG to Circ. (N/A) TRANSESOPHAGEAL ECHOCARDIOGRAM (TEE) (N/A)  Doing well POD1 Maintaining NSR w/ stable hemodynamics, on milrinone and Neo drips Breathing comfortably w/ O2 sats 94% on 2 L/min Expected post op acute blood loss anemia, very mild Expected post op atelectasis, mild Expected post op volume excess, weight reportedly 9 kg > preop, UOP adequate   Mobilize  Diuresis  D/C tubes and lines  Add toradol for pain control   Rexene Alberts, MD 11/22/2017 9:10 AM

## 2017-11-22 NOTE — Progress Notes (Signed)
TCTS BRIEF SICU PROGRESS NOTE  1 Day Post-Op  S/P Procedure(s) (LRB): CORONARY ARTERY BYPASS GRAFTING (CABG) times 3 using left internal mammary artery and right greater saphenous vein harvested endscopically. LIMA to LAD, SVG to Distal Right, SVG to Circ. (N/A) TRANSESOPHAGEAL ECHOCARDIOGRAM (TEE) (N/A)   Stable day Feels somewhat improved since tubes out NSR w/ stable BP Breathing comfortably w/ O2 sats 980-100% UOP adequate   Plan: Continue current plan  Rexene Alberts, MD 11/22/2017 8:01 PM

## 2017-11-23 ENCOUNTER — Inpatient Hospital Stay (HOSPITAL_COMMUNITY): Payer: Medicaid Other

## 2017-11-23 DIAGNOSIS — Z951 Presence of aortocoronary bypass graft: Secondary | ICD-10-CM

## 2017-11-23 LAB — GLUCOSE, CAPILLARY
GLUCOSE-CAPILLARY: 81 mg/dL (ref 70–99)
GLUCOSE-CAPILLARY: 101 mg/dL — AB (ref 70–99)
GLUCOSE-CAPILLARY: 96 mg/dL (ref 70–99)
GLUCOSE-CAPILLARY: 118 mg/dL — AB (ref 70–99)
Glucose-Capillary: 95 mg/dL (ref 70–99)
Glucose-Capillary: 102 mg/dL — ABNORMAL HIGH (ref 70–99)

## 2017-11-23 LAB — COOXEMETRY PANEL
Carboxyhemoglobin: 1.4 % (ref 0.5–1.5)
Methemoglobin: 1.6 % — ABNORMAL HIGH (ref 0.0–1.5)
O2 Saturation: 60.6 %
Total hemoglobin: 13.4 g/dL (ref 12.0–16.0)

## 2017-11-23 LAB — CBC
HCT: 39.1 % (ref 39.0–52.0)
HEMOGLOBIN: 12.4 g/dL — AB (ref 13.0–17.0)
MCH: 33.4 pg (ref 26.0–34.0)
MCHC: 31.7 g/dL (ref 30.0–36.0)
MCV: 105.4 fL — ABNORMAL HIGH (ref 80.0–100.0)
Platelets: 212 10*3/uL (ref 150–400)
RBC: 3.71 MIL/uL — AB (ref 4.22–5.81)
RDW: 12.7 % (ref 11.5–15.5)
WBC: 17.9 10*3/uL — AB (ref 4.0–10.5)
nRBC: 0 % (ref 0.0–0.2)

## 2017-11-23 LAB — BASIC METABOLIC PANEL
ANION GAP: 5 (ref 5–15)
BUN: 8 mg/dL (ref 6–20)
CO2: 27 mmol/L (ref 22–32)
Calcium: 8.4 mg/dL — ABNORMAL LOW (ref 8.9–10.3)
Chloride: 101 mmol/L (ref 98–111)
Creatinine, Ser: 0.98 mg/dL (ref 0.61–1.24)
GFR calc Af Amer: >60 mL/min (ref >60)
GFR calc non Af Amer: >60 mL/min (ref >60)
Glucose, Bld: 113 mg/dL — ABNORMAL HIGH (ref 70–99)
POTASSIUM: 4.2 mmol/L (ref 3.5–5.1)
SODIUM: 133 mmol/L — AB (ref 135–145)

## 2017-11-23 MED ORDER — INSULIN ASPART 100 UNIT/ML ~~LOC~~ SOLN: 0.0000 [IU] | Freq: Three times a day (TID) | SUBCUTANEOUS | Status: DC

## 2017-11-23 MED ORDER — ALPRAZOLAM 0.5 MG PO TABS
0.5000 mg | ORAL_TABLET | Freq: Three times a day (TID) | ORAL | Status: DC | PRN
  Administered 2017-11-23 – 2017-11-26 (×7): 0.5 mg via ORAL
  Filled 2017-11-23 (×7): qty 1

## 2017-11-23 MED ORDER — FOLIC ACID 1 MG PO TABS
1.0000 mg | ORAL_TABLET | Freq: Every day | ORAL | Status: DC
  Administered 2017-11-23 – 2017-11-26 (×4): 1 mg via ORAL
  Filled 2017-11-23 (×4): qty 1

## 2017-11-23 MED ORDER — LORAZEPAM 2 MG/ML IJ SOLN
0.0000 mg | INTRAMUSCULAR | Status: AC
  Administered 2017-11-23 – 2017-11-24 (×2): 2 mg via INTRAVENOUS
  Administered 2017-11-24 – 2017-11-25 (×2): 1 mg via INTRAVENOUS
  Administered 2017-11-25: 2 mg via INTRAVENOUS
  Filled 2017-11-23 (×6): qty 1

## 2017-11-23 MED ORDER — ONDANSETRON HCL 4 MG/2ML IJ SOLN
4.0000 mg | Freq: Four times a day (QID) | INTRAMUSCULAR | Status: DC | PRN
  Administered 2017-11-23: 4 mg via INTRAVENOUS
  Filled 2017-11-23: qty 2

## 2017-11-23 MED ORDER — METOCLOPRAMIDE HCL 5 MG/ML IJ SOLN
10.0000 mg | Freq: Four times a day (QID) | INTRAMUSCULAR | Status: AC
  Administered 2017-11-23 – 2017-11-24 (×4): 10 mg via INTRAVENOUS
  Filled 2017-11-23 (×4): qty 2

## 2017-11-23 MED ORDER — LORAZEPAM 2 MG/ML IJ SOLN: 0.0000 mg | Freq: Two times a day (BID) | INTRAMUSCULAR | Status: DC

## 2017-11-23 MED ORDER — PANTOPRAZOLE SODIUM 40 MG PO TBEC: 40.0000 mg | DELAYED_RELEASE_TABLET | Freq: Once | ORAL | Status: DC

## 2017-11-23 MED ORDER — GUAIFENESIN ER 600 MG PO TB12
1200.0000 mg | ORAL_TABLET | Freq: Two times a day (BID) | ORAL | Status: DC
  Administered 2017-11-23 – 2017-11-26 (×6): 1200 mg via ORAL
  Filled 2017-11-23 (×7): qty 2

## 2017-11-23 MED ORDER — PANTOPRAZOLE SODIUM 40 MG PO TBEC: 40.0000 mg | DELAYED_RELEASE_TABLET | Freq: Every day | ORAL | Status: DC

## 2017-11-23 MED ORDER — DEXMEDETOMIDINE HCL IN NACL 400 MCG/100ML IV SOLN
0.2000 ug/kg/h | INTRAVENOUS | Status: DC
  Administered 2017-11-23: 0.2 ug/kg/h via INTRAVENOUS
  Filled 2017-11-23: qty 100

## 2017-11-23 NOTE — Progress Notes (Addendum)
TCTS DAILY ICU PROGRESS NOTE                   [address]            [place] [address]          [phone]   2 Days Post-Op Procedure(s) (LRB): CORONARY ARTERY BYPASS GRAFTING (CABG) times 3 using left internal mammary artery and right greater saphenous vein harvested endscopically. LIMA to LAD, SVG to Distal Right, SVG to Circ. (N/A) TRANSESOPHAGEAL ECHOCARDIOGRAM (TEE) (N/A)  Total Length of Stay:  LOS: 8 days   Subjective:  Patient with multiple complaints.  He states that he is terrified and feels very short of breath.  He states that he needs something for his anxiety as he drinks heavily at home 18 beers a day and needs something for relief.  He is nauseated and vomited during evaluation.  Objective: Vital signs in last 24 hours: Temp:  [97.8 F (36.6 C)-98.1 F (36.7 C)] 97.9 F (36.6 C) (10/20 0745) Pulse Rate:  [88-122] 107 (10/20 1400) Cardiac Rhythm: Sinus tachycardia (10/20 1200) Resp:  [10-30] 26 (10/20 1400) BP: (90-142)/(59-95) 120/82 (10/20 1400) SpO2:  [89 %-100 %] 92 % (10/20 1400) Arterial Line BP: (93-105)/(55-56) 102/55 (10/19 1900) Weight:  [72.9 kg-79.2 kg] 72.9 kg (10/20 0901)  Filed Weights   11/22/17 0500 11/23/17 0600 11/23/17 0901  Weight: 72.3 kg 79.2 kg 72.9 kg    Weight change: 6.851 kg   Hemodynamic parameters for last 24 hours: CVP:  [2 mmHg] 2 mmHg  Intake/Output from previous day: 10/19 0701 - 10/20 0700 In: 1123.2 [I.V.:923.2; IV Piggyback:200] Out: 1630 [Urine:1630]  Intake/Output this shift: Total I/O In: 395.3 [P.O.:360; I.V.:35.3] Out: -   Current Meds: Scheduled Meds: . acetaminophen  1,000 mg Oral Q6H  . aspirin EC  325 mg Oral Daily  . atorvastatin  40 mg Oral q1800  . bisacodyl  10 mg Oral Daily   Or  . bisacodyl  10 mg Rectal Daily  . docusate sodium  200 mg Oral Daily  . enoxaparin (LOVENOX) injection  40 mg Subcutaneous QHS  . folic acid  1 mg Oral Daily  . furosemide  20 mg Intravenous  BID  . insulin aspart  0-24 Units Subcutaneous TID AC & HS  . metoCLOPramide (REGLAN) injection  10 mg Intravenous Q6H  . metoprolol tartrate  12.5 mg Oral BID  . nicotine  21 mg Transdermal Daily  . pantoprazole  40 mg Oral Daily  . [START ON 11/24/2017] pantoprazole  40 mg Oral Q1200  . pantoprazole  40 mg Oral Once  . sodium chloride flush  3 mL Intravenous Q12H  . thiamine  100 mg Oral Daily   Or  . thiamine  100 mg Intravenous Daily   Continuous Infusions: . sodium chloride    . dexmedetomidine (PRECEDEX) IV infusion    . lactated ringers 20 mL/hr at 11/23/17 0700   PRN Meds:.ALPRAZolam, levalbuterol, metoprolol tartrate, morphine injection, ondansetron (ZOFRAN) IV, oxyCODONE, sodium chloride flush, traMADol  General appearance: alert, cooperative and no distress Heart: regular rate and rhythm Lungs: clear to auscultation bilaterally Abdomen: soft, mild distention, minimal BS Extremities: edema trace Wound: clean and dry, aquacel in place  Lab Results: CBC: Recent Labs    11/22/17 1628 11/22/17 1635 11/23/17 0545  WBC 18.0*  --  17.9*  HGB 12.3* 11.9* 12.4*  HCT 37.9* 35.0* 39.1  PLT 207  --  212   BMET:  Recent Labs  11/22/17 0413  11/22/17 1635 11/23/17 0545  NA 133*  --  134* 133*  K 4.3  --  4.0 4.2  CL 103  --  100 101  CO2 22  --   --  27  GLUCOSE 103*  --  147* 113*  BUN 6  --  7 8  CREATININE 0.97   < > 1.00 0.98  CALCIUM 8.1*  --   --  8.4*   < > = values in this interval not displayed.    CMET: Lab Results  Component Value Date   WBC 17.9 (H) 11/23/2017   HGB 12.4 (L) 11/23/2017   HCT 39.1 11/23/2017   PLT 212 11/23/2017   GLUCOSE 113 (H) 11/23/2017   CHOL 204 (H) 11/14/2017   TRIG 76 11/14/2017   HDL 75 11/14/2017   LDLCALC 114 (H) 11/14/2017   ALT 54 (H) 11/20/2017   AST 45 (H) 11/20/2017   NA 133 (L) 11/23/2017   K 4.2 11/23/2017   CL 101 11/23/2017   CREATININE 0.98 11/23/2017   BUN 8 11/23/2017   CO2 27 11/23/2017   INR  1.19 11/21/2017   HGBA1C 5.5 11/19/2017      PT/INR:  Recent Labs    11/21/17 1334  LABPROT 15.0  INR 1.19   Radiology: Dg Chest Port 1 View  Result Date: 11/23/2017 CLINICAL DATA:  Atelectasis. EXAM: PORTABLE CHEST 1 VIEW COMPARISON:  11/22/2017 FINDINGS: Right IJ central venous sheath has tip over the SVC. Lungs are adequately inflated with minimal bibasilar density likely atelectasis. No effusion. Cardiomediastinal silhouette and remainder of the exam is unchanged. IMPRESSION: Mild bibasilar atelectatic change. Electronically Signed   By: Marin Olp M.D.   On: 11/23/2017 14:24     Assessment/Plan: S/P Procedure(s) (LRB): CORONARY ARTERY BYPASS GRAFTING (CABG) times 3 using left internal mammary artery and right greater saphenous vein harvested endscopically. LIMA to LAD, SVG to Distal Right, SVG to Circ. (N/A) TRANSESOPHAGEAL ECHOCARDIOGRAM (TEE) (N/A)  1. CV- NSR, off all drips- start low dose tomorrow 2. Pulm- patient feels short of breath, CXR looks great, no pleural effusions, minimal atelectasis, will continue IS, this is likely due to anxiety, will add xanax prn  3. Renal- creatinine WNL, weight is stable, just got off Neo this afternoon, if needed can start Lasix tomorrow 4. GI- nausea, minimal BS- will continue zofran prn, add reglan 1- mg IV for 4 doses 5. ETOH abuse- drinks 18 beers per day, on Thiamine, Folic Acid, Multivitamin, will add CIWA protocol back  6. Dispo- patient with agitation, N/V this afternoon, add xanax prn, reglan for 4 doses, restart CIWA protocol for likely withdrawal symptoms, weaned off all drips this afternoon, continue current care   Ellwood Handler 11/23/2017 3:28 PM   I have seen and examined the patient and agree with the assessment and plan as outlined.  Rexene Alberts, MD 11/23/2017 6:37 PM

## 2017-11-24 ENCOUNTER — Inpatient Hospital Stay (HOSPITAL_COMMUNITY): Payer: Medicaid Other

## 2017-11-24 ENCOUNTER — Encounter (HOSPITAL_COMMUNITY): Payer: Self-pay | Admitting: Cardiothoracic Surgery

## 2017-11-24 DIAGNOSIS — I25118 Atherosclerotic heart disease of native coronary artery with other forms of angina pectoris: Secondary | ICD-10-CM

## 2017-11-24 LAB — CBC
HCT: 37.6 % — ABNORMAL LOW (ref 39.0–52.0)
Hemoglobin: 11.9 g/dL — ABNORMAL LOW (ref 13.0–17.0)
MCH: 32.9 pg (ref 26.0–34.0)
MCHC: 31.6 g/dL (ref 30.0–36.0)
MCV: 103.9 fL — ABNORMAL HIGH (ref 80.0–100.0)
Platelets: 246 10*3/uL (ref 150–400)
RBC: 3.62 MIL/uL — ABNORMAL LOW (ref 4.22–5.81)
RDW: 12.6 % (ref 11.5–15.5)
WBC: 13.4 10*3/uL — ABNORMAL HIGH (ref 4.0–10.5)
nRBC: 0 % (ref 0.0–0.2)

## 2017-11-24 LAB — HEPATIC FUNCTION PANEL
ALK PHOS: 53 U/L (ref 38–126)
ALT: 28 U/L (ref 0–44)
AST: 32 U/L (ref 15–41)
Albumin: 2.6 g/dL — ABNORMAL LOW (ref 3.5–5.0)
BILIRUBIN INDIRECT: 0.9 mg/dL (ref 0.3–0.9)
BILIRUBIN TOTAL: 1.5 mg/dL — AB (ref 0.3–1.2)
Bilirubin, Direct: 0.6 mg/dL — ABNORMAL HIGH (ref 0.0–0.2)
Total Protein: 5.7 g/dL — ABNORMAL LOW (ref 6.5–8.1)

## 2017-11-24 LAB — GLUCOSE, CAPILLARY
GLUCOSE-CAPILLARY: 90 mg/dL (ref 70–99)
Glucose-Capillary: 101 mg/dL — ABNORMAL HIGH (ref 70–99)
Glucose-Capillary: 95 mg/dL (ref 70–99)
Glucose-Capillary: 115 mg/dL — ABNORMAL HIGH (ref 70–99)
Glucose-Capillary: 101 mg/dL — ABNORMAL HIGH (ref 70–99)

## 2017-11-24 LAB — BASIC METABOLIC PANEL
Anion gap: 7 (ref 5–15)
BUN: 9 mg/dL (ref 6–20)
CO2: 28 mmol/L (ref 22–32)
Calcium: 8.4 mg/dL — ABNORMAL LOW (ref 8.9–10.3)
Chloride: 99 mmol/L (ref 98–111)
Creatinine, Ser: 0.91 mg/dL (ref 0.61–1.24)
GFR calc Af Amer: >60 mL/min (ref >60)
GFR calc non Af Amer: >60 mL/min (ref >60)
Glucose, Bld: 103 mg/dL — ABNORMAL HIGH (ref 70–99)
Potassium: 3.8 mmol/L (ref 3.5–5.1)
Sodium: 134 mmol/L — ABNORMAL LOW (ref 135–145)

## 2017-11-24 LAB — LIPASE, BLOOD: LIPASE: 22 U/L (ref 11–51)

## 2017-11-24 LAB — AMYLASE: Amylase: 23 U/L — ABNORMAL LOW (ref 28–100)

## 2017-11-24 MED ORDER — SODIUM CHLORIDE 0.9% FLUSH
3.0000 mL | Freq: Two times a day (BID) | INTRAVENOUS | Status: DC
  Administered 2017-11-24 – 2017-11-26 (×5): 3 mL via INTRAVENOUS

## 2017-11-24 MED ORDER — METOPROLOL TARTRATE 25 MG PO TABS
25.0000 mg | ORAL_TABLET | Freq: Two times a day (BID) | ORAL | Status: DC
  Administered 2017-11-24 – 2017-11-26 (×4): 25 mg via ORAL
  Filled 2017-11-24 (×4): qty 1

## 2017-11-24 MED ORDER — LACTULOSE 10 GM/15ML PO SOLN
30.0000 g | Freq: Every day | ORAL | Status: DC | PRN
  Administered 2017-11-24: 30 g via ORAL
  Filled 2017-11-24: qty 45

## 2017-11-24 MED ORDER — SODIUM CHLORIDE 0.9% FLUSH: 3.0000 mL | INTRAVENOUS | Status: DC | PRN

## 2017-11-24 MED ORDER — SODIUM CHLORIDE 0.9 % IV SOLN: 250.0000 mL | INTRAVENOUS | Status: DC | PRN

## 2017-11-24 MED ORDER — MOVING RIGHT ALONG BOOK
Freq: Once | Status: AC
  Administered 2017-11-24: 19:00:00
  Filled 2017-11-24: qty 1

## 2017-11-24 MED ORDER — INSULIN ASPART 100 UNIT/ML ~~LOC~~ SOLN
0.0000 [IU] | Freq: Three times a day (TID) | SUBCUTANEOUS | Status: DC
  Administered 2017-11-25 (×2): 2 [IU] via SUBCUTANEOUS

## 2017-11-24 NOTE — Progress Notes (Addendum)
TCTS DAILY ICU PROGRESS NOTE                   [address]            [place] [address]          [phone]   3 Days Post-Op Procedure(s) (LRB): CORONARY ARTERY BYPASS GRAFTING (CABG) times 3 using left internal mammary artery and right greater saphenous vein harvested endscopically. LIMA to LAD, SVG to Distal Right, SVG to Circ. (N/A) TRANSESOPHAGEAL ECHOCARDIOGRAM (TEE) (N/A)  Total Length of Stay:  LOS: 9 days   Subjective: Pain is well controlled. Looking forward to being transferred out of the ICU  Objective: Vital signs in last 24 hours: Temp:  [98.8 F (37.1 C)-99.7 F (37.6 C)] 99.1 F (37.3 C) (10/21 0300) Pulse Rate:  [97-124] 115 (10/21 0600) Cardiac Rhythm: Sinus tachycardia (10/20 2000) Resp:  [16-32] 20 (10/21 0600) BP: (94-150)/(69-89) 106/78 (10/21 0600) SpO2:  [89 %-99 %] 92 % (10/21 0600) Weight:  [71.6 kg-72.9 kg] 71.6 kg (10/21 0500)  Filed Weights   11/23/17 0600 11/23/17 0901 11/24/17 0500  Weight: 79.2 kg 72.9 kg 71.6 kg    Weight change: -6.3 kg   Hemodynamic parameters for last 24 hours: CVP:  [2 mmHg] 2 mmHg  Intake/Output from previous day: 10/20 0701 - 10/21 0700 In: 1120.3 [P.O.:360; I.V.:35.3] Out: 700 [Urine:700]  Intake/Output this shift: No intake/output data recorded.  Current Meds: Scheduled Meds: . acetaminophen  1,000 mg Oral Q6H  . aspirin EC  325 mg Oral Daily  . atorvastatin  40 mg Oral q1800  . bisacodyl  10 mg Oral Daily   Or  . bisacodyl  10 mg Rectal Daily  . docusate sodium  200 mg Oral Daily  . enoxaparin (LOVENOX) injection  40 mg Subcutaneous QHS  . folic acid  1 mg Oral Daily  . furosemide  20 mg Intravenous BID  . guaiFENesin  1,200 mg Oral BID  . insulin aspart  0-24 Units Subcutaneous TID AC & HS  . LORazepam  0-4 mg Intravenous Q4H   Followed by  . [START ON 11/25/2017] LORazepam  0-4 mg Intravenous Q12H  . metoCLOPramide (REGLAN) injection  10 mg Intravenous Q6H  . metoprolol  tartrate  12.5 mg Oral BID  . nicotine  21 mg Transdermal Daily  . pantoprazole  40 mg Oral Daily  . sodium chloride flush  3 mL Intravenous Q12H  . thiamine  100 mg Oral Daily   Or  . thiamine  100 mg Intravenous Daily   Continuous Infusions: . sodium chloride    . lactated ringers Stopped (11/23/17 1100)   PRN Meds:.ALPRAZolam, levalbuterol, metoprolol tartrate, morphine injection, ondansetron (ZOFRAN) IV, oxyCODONE, sodium chloride flush, traMADol  General appearance: alert, cooperative and no distress Heart: sinus tachycardia Lungs: clear to auscultation bilaterally Abdomen: soft, non-tender; bowel sounds normal; no masses,  no organomegaly Extremities: extremities normal, atraumatic, no cyanosis or edema Wound: clean and dry, small area on first 1/3 of incision with bloody drainage   Lab Results: CBC: Recent Labs    11/23/17 0545 11/24/17 0446  WBC 17.9* 13.4*  HGB 12.4* 11.9*  HCT 39.1 37.6*  PLT 212 246   BMET:  Recent Labs    11/23/17 0545 11/24/17 0446  NA 133* 134*  K 4.2 3.8  CL 101 99  CO2 27 28  GLUCOSE 113* 103*  BUN 8 9  CREATININE 0.98 0.91  CALCIUM 8.4* 8.4*    CMET:  Lab Results  Component Value Date   WBC 13.4 (H) 11/24/2017   HGB 11.9 (L) 11/24/2017   HCT 37.6 (L) 11/24/2017   PLT 246 11/24/2017   GLUCOSE 103 (H) 11/24/2017   CHOL 204 (H) 11/14/2017   TRIG 76 11/14/2017   HDL 75 11/14/2017   LDLCALC 114 (H) 11/14/2017   ALT 28 11/24/2017   AST 32 11/24/2017   NA 134 (L) 11/24/2017   K 3.8 11/24/2017   CL 99 11/24/2017   CREATININE 0.91 11/24/2017   BUN 9 11/24/2017   CO2 28 11/24/2017   INR 1.19 11/21/2017   HGBA1C 5.5 11/19/2017      PT/INR:  Recent Labs    11/21/17 1334  LABPROT 15.0  INR 1.19   Radiology: Dg Abd 1 View  Result Date: 11/23/2017 CLINICAL DATA:  Abdominal distention EXAM: ABDOMEN - 1 VIEW COMPARISON:  CT 11/12/2017 FINDINGS: Nonobstructive bowel gas pattern. No free air or organomegaly. No suspicious  calcification or acute bony abnormality. IMPRESSION: No acute findings. Electronically Signed   By: Rolm Baptise M.D.   On: 11/23/2017 19:43   Dg Chest Port 1 View  Result Date: 11/23/2017 CLINICAL DATA:  Atelectasis. EXAM: PORTABLE CHEST 1 VIEW COMPARISON:  11/22/2017 FINDINGS: Right IJ central venous sheath has tip over the SVC. Lungs are adequately inflated with minimal bibasilar density likely atelectasis. No effusion. Cardiomediastinal silhouette and remainder of the exam is unchanged. IMPRESSION: Mild bibasilar atelectatic change. Electronically Signed   By: Marin Olp M.D.   On: 11/23/2017 14:24     Assessment/Plan: S/P Procedure(s) (LRB): CORONARY ARTERY BYPASS GRAFTING (CABG) times 3 using left internal mammary artery and right greater saphenous vein harvested endscopically. LIMA to LAD, SVG to Distal Right, SVG to Circ. (N/A) TRANSESOPHAGEAL ECHOCARDIOGRAM (TEE) (N/A)  1. CV- NSR, off all drips- on metoprolol 12.29m BID-increase to 271mBID 2. Pulm- CXR look stable.   3. Renal- creatinine 0.91, continue Lasix. Electrolytes okay 4. GI- nausea, minimal BS- will continue zofran prn, add reglan 1- mg IV for 4 doses 5. ETOH abuse- drinks 18 beers per day, on Thiamine, Folic Acid, Multivitamin, CIWA protocol back  6. Dispo- Transfer to stepdown unit. Will increase Metoprolol once BP increases-remains low this morning. Continue to use incentive spirometer. Encouraged ambulation in the halls.       TeElgie Collard0/21/2019 7:53 AM  I have seen and examined BeLinard Millersnd agree with the above assessment  and plan.  EdGrace IsaacD Beeper [phone] [phone]/21/2019 12:14 PM

## 2017-11-24 NOTE — Progress Notes (Signed)
Paged Dr Servando Snare for Mucinex order due to pt coughing with pain. Verbal order given for Mucinex 651m bid. JJerald Kief RN

## 2017-11-24 NOTE — Op Note (Signed)
NAME: MANDELL, PANGBORN MEDICAL RECORD QQ:[id number] ACCOUNT [id number] DATE OF BIRTH:05-01-1970 FACILITY: [facility] LOCATION: [facility] PHYSICIAN:Kohler Pellerito Maryruth Bun, MD  OPERATIVE REPORT  DATE OF PROCEDURE:  11/21/2017  PREOPERATIVE DIAGNOSIS:  Coronary occlusive disease with high-grade left anterior descending  disease.  POSTOPERATIVE DIAGNOSIS:  Coronary occlusive disease with high-grade left anterior descending disease.  SURGICAL PROCEDURE:  Coronary artery bypass grafting x3 with the left internal mammary to the left anterior descending coronary artery, reverse saphenous vein graft to the circumflex coronary artery, reverse saphenous vein graft to the distal right  coronary artery with right thigh endoscopic vein harvesting of the right greater saphenous vein.  SURGEON:  Lanelle Bal, MD  FIRST ASSISTANT:  Nicholes Rough, PA.  BRIEF HISTORY:  The patient is a 47 year old male who had presented in early July to [facility] with chest pains.  He was discharged home to follow up with cardiology.  He presented again to the emergency room again with chest pain and again was  discharged home; however, at 4 in the morning when his troponins report was positive, he was called to return to the [facility] Emergency Room, which he did and transferred to [facility] for further evaluation including cardiac catheterization.    Cardiac catheterization revealed high grade LAD disease and moderate right and circumflex disease, coronary artery bypass grafting was recommended to the patient.  Consult was requested from cardiology.  The patient was seen and bypass was recommended to  him, but he immediately signed out against medical advice and went home.    The patient has a long history of heavy tobacco use and heavy alcohol use.  Several days later, he returned to the hospital and was readmitted by cardiology, started on heparin and nitro drip.  After observing to ensure that he would not develop  DTs.  We  then proceeded with the previously planned coronary artery bypass grafting.  Risks and options were discussed with the patient in detail and he was agreeable and signed informed consent.  DESCRIPTION OF PROCEDURE:  With Swan-Ganz and arterial line monitors in place, the patient underwent general endotracheal anesthesia without incident.  Skin the chest and legs was prepped with Betadine, draped in the usual sterile manner.  Appropriate  timeout was performed.  We then proceeded with endoscopic harvesting of the right greater saphenous vein from the thigh and calf.  The vein was of good quality and caliber.  Median sternotomy was performed.  The left internal mammary artery was dissected  down as a pedicle graft.  The distal artery was divided.  The patient had a significant pulmonary disease by preoperative PFTs.  Pericardium was opened.  The patient was systemically heparinized.  The ascending aorta was cannulated.  The right atrium  was cannulated.  An aortic root vent cardioplegia needle was introduced into the ascending aorta.  The patient was placed on cardiopulmonary bypass, 2.4 liters per minute per meter square.  Sites and anastomoses were selected and dissected out of the  epicardium.  The patient's body temperature was cooled to 32 degrees.  Aortic crossclamp was applied and 500 mL of cold blood potassium cardioplegia was administered with diastolic arrest of the heart.  Myocardial septal temperature was monitored  throughout the crossclamp.  Attention was turned first to the distal right coronary artery, which was a good quality vessel.  The vessel was opened and as 1.8 mm in size.  Using a running 7-0 Prolene, distal anastomosis was performed with a segment of  reverse saphenous vein  graft.  The heart was then elevated and the obtuse marginal vessel was opened and was 1.5 mm in size.  Using a running 7-0 Prolene, distal anastomosis was performed.  The left anterior descending coronary  artery between the mid and  distal third of the vessel was opened, admitted a 1.5 mm probe distally.  Using a running 8-0 Prolene, the left internal mammary artery was anastomosed to left anterior descending coronary artery.  With cross clamp still in place, 2 punch aortotomies  were performed and each of the 2 vein grafts were anastomosed to the ascending aorta.  The bulldog and the mammary artery was removed with prompt rise in myocardial septal temperature.  The heart was allowed to passively fill and deair.  The proximal  anastomoses were completed and the aortic crossclamp was removed with total crossclamp time of 99 minutes.  The patient required electrical defibrillation, turned to a sinus rhythm.  Sites of anastomosis were inspected and free of bleeding.  He was then  ventilated and weaned from cardiopulmonary bypass without difficulty.  He remained hemodynamically stable.  He was decannulated in the usual fashion.  Protamine sulfate was administered.  With operative field hemostatic,  atrial and ventricular pacing  wires were applied.  Graft markers were applied.  A left pleural tube and a Blake mediastinal drain were left in place.  The sternum was closed with #6 stainless steel wire.  Fascia closed with interrupted 0 Vicryl, running 3-0 Vicryl in subcutaneous  tissue, 4-0 subcuticular stitch in skin edges.  Dry dressings were applied.    Sponge and needle count was reported as correct at completion of procedure.  AN/NUANCE  D:11/23/2017 T:11/24/2017 JOB:[id number]

## 2017-11-24 NOTE — Op Note (Deleted)
  The note originally documented on this encounter has been moved the the encounter in which it belongs.  

## 2017-11-24 NOTE — Progress Notes (Addendum)
Pt received from [place]. Pt c/a/o4. Pt given CHB bath. Telebox 16 applied. CCMD notified. PT denies any complaints or pain currently. Vitals stable. Will continue to monitor.

## 2017-11-25 ENCOUNTER — Inpatient Hospital Stay (HOSPITAL_COMMUNITY): Payer: Medicaid Other

## 2017-11-25 DIAGNOSIS — I2583 Coronary atherosclerosis due to lipid rich plaque: Secondary | ICD-10-CM

## 2017-11-25 LAB — BASIC METABOLIC PANEL
Anion gap: 9 (ref 5–15)
BUN: 9 mg/dL (ref 6–20)
CHLORIDE: 97 mmol/L — AB (ref 98–111)
CO2: 27 mmol/L (ref 22–32)
CREATININE: 0.97 mg/dL (ref 0.61–1.24)
Calcium: 8.3 mg/dL — ABNORMAL LOW (ref 8.9–10.3)
GFR calc Af Amer: >60 mL/min (ref >60)
GLUCOSE: 104 mg/dL — AB (ref 70–99)
Potassium: 3.2 mmol/L — ABNORMAL LOW (ref 3.5–5.1)
SODIUM: 133 mmol/L — AB (ref 135–145)

## 2017-11-25 LAB — CBC
HCT: 34.6 % — ABNORMAL LOW (ref 39.0–52.0)
Hemoglobin: 10.9 g/dL — ABNORMAL LOW (ref 13.0–17.0)
MCH: 32.4 pg (ref 26.0–34.0)
MCHC: 31.5 g/dL (ref 30.0–36.0)
MCV: 103 fL — ABNORMAL HIGH (ref 80.0–100.0)
PLATELETS: 336 10*3/uL (ref 150–400)
RBC: 3.36 MIL/uL — ABNORMAL LOW (ref 4.22–5.81)
RDW: 12.6 % (ref 11.5–15.5)
WBC: 10.3 10*3/uL (ref 4.0–10.5)
nRBC: 0 % (ref 0.0–0.2)

## 2017-11-25 LAB — GLUCOSE, CAPILLARY
GLUCOSE-CAPILLARY: 78 mg/dL (ref 70–99)
GLUCOSE-CAPILLARY: 125 mg/dL — AB (ref 70–99)
Glucose-Capillary: 128 mg/dL — ABNORMAL HIGH (ref 70–99)
Glucose-Capillary: 102 mg/dL — ABNORMAL HIGH (ref 70–99)

## 2017-11-25 MED FILL — Potassium Chloride Inj 2 mEq/ML: INTRAVENOUS | Qty: 40 | Status: AC

## 2017-11-25 MED FILL — Heparin Sodium (Porcine) Inj 1000 Unit/ML: INTRAMUSCULAR | Qty: 30 | Status: AC

## 2017-11-25 MED FILL — Magnesium Sulfate Inj 50%: INTRAMUSCULAR | Qty: 10 | Status: AC

## 2017-11-25 NOTE — Progress Notes (Addendum)
      [address]       [place] [address]             [phone]      4 Days Post-Op Procedure(s) (LRB): CORONARY ARTERY BYPASS GRAFTING (CABG) times 3 using left internal mammary artery and right greater saphenous vein harvested endscopically. LIMA to LAD, SVG to Distal Right, SVG to Circ. (N/A) TRANSESOPHAGEAL ECHOCARDIOGRAM (TEE) (N/A) Subjective: Feeling agitated this morning. He can't get any sleep and he wants to leave the hospital tonight. Eating a biscuit this morning.   Objective: Vital signs in last 24 hours: Temp:  [97.3 F (36.3 C)-99.8 F (37.7 C)] 98.2 F (36.8 C) (10/22 0733) Pulse Rate:  [104-131] 120 (10/22 0733) Cardiac Rhythm: Sinus tachycardia (10/22 0145) Resp:  [16-30] 24 (10/22 0733) BP: (94-115)/(68-87) 94/84 (10/22 0733) SpO2:  [86 %-100 %] 96 % (10/22 0733) Weight:  [68.8 kg] 68.8 kg (10/22 0500)     Intake/Output from previous day: 10/21 0701 - 10/22 0700 In: 660 [P.O.:660] Out: 825 [Urine:825] Intake/Output this shift: No intake/output data recorded.  General appearance: alert, cooperative and mild distress Heart: sinus trachycardia Lungs: clear to auscultation bilaterally Abdomen: soft, non-tender; bowel sounds normal; no masses,  no organomegaly Extremities: extremities normal, atraumatic, no cyanosis or edema Wound: clean and dry  Lab Results: Recent Labs    11/24/17 0446 11/25/17 0409  WBC 13.4* 10.3  HGB 11.9* 10.9*  HCT 37.6* 34.6*  PLT 246 336   BMET:  Recent Labs    11/24/17 0446 11/25/17 0409  NA 134* 133*  K 3.8 3.2*  CL 99 97*  CO2 28 27  GLUCOSE 103* 104*  BUN 9 9  CREATININE 0.91 0.97  CALCIUM 8.4* 8.3*    PT/INR: No results for input(s): LABPROT, INR in the last 72 hours. ABG    Component Value Date/Time   PHART 7.313 (L) 11/21/2017 1743   HCO3 22.2 11/21/2017 1743   TCO2 25 11/22/2017 1635   ACIDBASEDEF 4.0 (H) 11/21/2017 1743   O2SAT 60.6 11/23/2017 1959   CBG (last 3)  Recent  Labs    11/24/17 1725 11/24/17 2057 11/25/17 0600  GLUCAP 115* 101* 78    Assessment/Plan: S/P Procedure(s) (LRB): CORONARY ARTERY BYPASS GRAFTING (CABG) times 3 using left internal mammary artery and right greater saphenous vein harvested endscopically. LIMA to LAD, SVG to Distal Right, SVG to Circ. (N/A) TRANSESOPHAGEAL ECHOCARDIOGRAM (TEE) (N/A)  1. CV- sinus tachycardia in the 120s. Unable to titrate BB further due to hypotension.  2. Pulm- CXR look stable. Tolerating 3. Renal- creatinine 0.91, continue Lasix. Electrolytes okay 4. GI- nausea, minimal BS- will continue zofran prn, add reglan 1- mg IV for 4 doses 5. ETOH abuse- drinks 18 beers per day, on Thiamine, Folic Acid, Multivitamin, CIWA protocol back  6. Endo-blood glucose has been well controlled  Plan: Continue to titrate BB as tolerated. Will get an EKG to further evaluate rhythm. Use PRN Ativan for agitation/anxiety. EPW remain intact. He is using his incentive spirometer and walking in the halls.     LOS: 10 days    Elgie Collard 11/25/2017  Sinus tachycardia  On ekg not a fib Poss home soon I have seen and examined Greg [name] and agree with the above assessment  and plan.  Grace Isaac MD Beeper [phone] Office [phone] 11/25/2017 8:03 PM

## 2017-11-25 NOTE — Progress Notes (Signed)
CARDIAC REHAB PHASE I   PRE:  Rate/Rhythm: 106 ST asleep    BP: sitting 91/67    SaO2: 91 RA  MODE:  Ambulation: 470 ft   POST:  Rate/Rhythm: 127 ST    BP: sitting 117/81     SaO2: 91 RA  Pt sleeping on arrival. Able to get out of bed and walk with RW, complied with sternal precautions. Hr increased with distance, SAO2 low at 89-91 RA. He is generally grumpy and wants to go home. He sts he actually plans to go to a hotel with his family to avoid being home with his 6 dogs. Return to bed, refused chair. 750 mL on IS (did x5). Greg [name], ACSM 11/25/2017 2:17 PM

## 2017-11-25 NOTE — Progress Notes (Addendum)
At night when Pt was sleeping, SPO2 82-86% with room air. 3 LPM of O2 NCL given to maintain SPO2 > 92%. Both lungs auscultated had coarse crackle and rhonchi lower bilateral. Encouraged Pt to cough and deep breathing,not effective coughing, he needed more reinforcement due to drowsiness, probably the sedative effect from Ativan and pain medicine. Will continue to monitor.  Kennyth Lose, RN

## 2017-11-26 ENCOUNTER — Encounter (HOSPITAL_COMMUNITY): Payer: Self-pay | Admitting: Physician Assistant

## 2017-11-26 LAB — BASIC METABOLIC PANEL
Anion gap: 11 (ref 5–15)
BUN: 12 mg/dL (ref 6–20)
CO2: 27 mmol/L (ref 22–32)
Calcium: 8.4 mg/dL — ABNORMAL LOW (ref 8.9–10.3)
Chloride: 97 mmol/L — ABNORMAL LOW (ref 98–111)
Creatinine, Ser: 1.08 mg/dL (ref 0.61–1.24)
GFR calc Af Amer: >60 mL/min (ref >60)
GFR calc non Af Amer: >60 mL/min (ref >60)
Glucose, Bld: 102 mg/dL — ABNORMAL HIGH (ref 70–99)
Potassium: 3.2 mmol/L — ABNORMAL LOW (ref 3.5–5.1)
Sodium: 135 mmol/L (ref 135–145)

## 2017-11-26 LAB — GLUCOSE, CAPILLARY
GLUCOSE-CAPILLARY: 91 mg/dL (ref 70–99)
GLUCOSE-CAPILLARY: 105 mg/dL — AB (ref 70–99)

## 2017-11-26 LAB — CBC
HCT: 34.9 % — ABNORMAL LOW (ref 39.0–52.0)
Hemoglobin: 11.5 g/dL — ABNORMAL LOW (ref 13.0–17.0)
MCH: 33.6 pg (ref 26.0–34.0)
MCHC: 33 g/dL (ref 30.0–36.0)
MCV: 102 fL — ABNORMAL HIGH (ref 80.0–100.0)
Platelets: 359 10*3/uL (ref 150–400)
RBC: 3.42 MIL/uL — ABNORMAL LOW (ref 4.22–5.81)
RDW: 12.5 % (ref 11.5–15.5)
WBC: 10.9 10*3/uL — ABNORMAL HIGH (ref 4.0–10.5)
nRBC: 0 % (ref 0.0–0.2)

## 2017-11-26 MED ORDER — POTASSIUM CHLORIDE CRYS ER 20 MEQ PO TBCR
40.0000 meq | EXTENDED_RELEASE_TABLET | Freq: Two times a day (BID) | ORAL | Status: DC
  Administered 2017-11-26: 40 meq via ORAL
  Filled 2017-11-26: qty 2

## 2017-11-26 MED ORDER — METOPROLOL TARTRATE 25 MG PO TABS: 25.0000 mg | ORAL_TABLET | Freq: Two times a day (BID) | ORAL | 1 refills | Status: DC

## 2017-11-26 MED ORDER — ALPRAZOLAM 0.5 MG PO TABS: 0.5000 mg | ORAL_TABLET | Freq: Two times a day (BID) | ORAL | 0 refills | Status: DC | PRN

## 2017-11-26 MED ORDER — ASPIRIN 325 MG PO TBEC: 325.0000 mg | DELAYED_RELEASE_TABLET | Freq: Every day | ORAL | 0 refills | Status: DC

## 2017-11-26 MED ORDER — METOPROLOL TARTRATE 12.5 MG HALF TABLET
12.5000 mg | ORAL_TABLET | Freq: Once | ORAL | Status: AC
  Administered 2017-11-26: 12.5 mg via ORAL
  Filled 2017-11-26: qty 1

## 2017-11-26 MED ORDER — ATORVASTATIN CALCIUM 80 MG PO TABS: 80.0000 mg | ORAL_TABLET | Freq: Every day | ORAL | 1 refills | Status: DC

## 2017-11-26 MED ORDER — POTASSIUM CHLORIDE CRYS ER 20 MEQ PO TBCR: 40.0000 meq | EXTENDED_RELEASE_TABLET | Freq: Two times a day (BID) | ORAL | Status: DC

## 2017-11-26 MED ORDER — METOPROLOL TARTRATE 37.5 MG PO TABS: 37.5000 mg | ORAL_TABLET | Freq: Two times a day (BID) | ORAL | 1 refills | Status: DC

## 2017-11-26 MED ORDER — METOPROLOL TARTRATE 25 MG PO TABS: 37.5000 mg | ORAL_TABLET | Freq: Two times a day (BID) | ORAL | Status: DC

## 2017-11-26 MED ORDER — NICOTINE 21 MG/24HR TD PT24: 21.0000 mg | MEDICATED_PATCH | Freq: Every day | TRANSDERMAL | 0 refills | Status: DC

## 2017-11-26 MED ORDER — ATORVASTATIN CALCIUM 40 MG PO TABS: 40.0000 mg | ORAL_TABLET | Freq: Every day | ORAL | 1 refills | Status: DC

## 2017-11-26 MED ORDER — OXYCODONE HCL 5 MG PO TABS: 5.0000 mg | ORAL_TABLET | ORAL | 0 refills | Status: DC | PRN

## 2017-11-26 MED ORDER — ATORVASTATIN CALCIUM 80 MG PO TABS: 80.0000 mg | ORAL_TABLET | Freq: Every day | ORAL | Status: DC

## 2017-11-26 MED FILL — Heparin Sodium (Porcine) Inj 1000 Unit/ML: INTRAMUSCULAR | Qty: 20 | Status: AC

## 2017-11-26 MED FILL — Mannitol IV Soln 20%: INTRAVENOUS | Qty: 500 | Status: AC

## 2017-11-26 MED FILL — Electrolyte-R (PH 7.4) Solution: INTRAVENOUS | Qty: 4000 | Status: AC

## 2017-11-26 MED FILL — Lidocaine HCl(Cardiac) IV PF Soln Pref Syr 100 MG/5ML (2%): INTRAVENOUS | Qty: 10 | Status: AC

## 2017-11-26 MED FILL — Sodium Bicarbonate IV Soln 8.4%: INTRAVENOUS | Qty: 50 | Status: AC

## 2017-11-26 MED FILL — Sodium Chloride IV Soln 0.9%: INTRAVENOUS | Qty: 2000 | Status: AC

## 2017-11-26 MED FILL — ALPRAZolam 0.5 MG TABS: 0.5 | 7 days supply | Qty: 14 | Fill #0

## 2017-11-26 MED FILL — ATORVASTATIN CALCIUM 80 MG: 80 | 30 days supply | Qty: 30 | Fill #0 | Status: TO

## 2017-11-26 MED FILL — oxyCODONE HCL 5 MG TABS: 5 | 5 days supply | Qty: 30 | Fill #0

## 2017-11-26 MED FILL — METOPROLOL TARTRATE 25 MG T: 25 | 30 days supply | Qty: 90 | Fill #0 | Status: TO

## 2017-11-26 NOTE — Progress Notes (Addendum)
Progress Note  Patient Name: Greg [name] Date of Encounter: 11/26/2017  Primary Cardiologist: Dorris Carnes, MD   Subjective   Feeling well. No chest pain, sob or palpitations. Mild tachycardia. Wants to go home.   Inpatient Medications    Scheduled Meds: . aspirin EC  325 mg Oral Daily  . atorvastatin  40 mg Oral q1800  . bisacodyl  10 mg Oral Daily   Or  . bisacodyl  10 mg Rectal Daily  . docusate sodium  200 mg Oral Daily  . enoxaparin (LOVENOX) injection  40 mg Subcutaneous QHS  . folic acid  1 mg Oral Daily  . furosemide  20 mg Intravenous BID  . guaiFENesin  1,200 mg Oral BID  . insulin aspart  0-24 Units Subcutaneous TID AC & HS  . LORazepam  0-4 mg Intravenous Q12H  . metoprolol tartrate  25 mg Oral BID  . nicotine  21 mg Transdermal Daily  . pantoprazole  40 mg Oral Daily  . potassium chloride  40 mEq Oral BID  . sodium chloride flush  3 mL Intravenous Q12H  . thiamine  100 mg Oral Daily   Or  . thiamine  100 mg Intravenous Daily   Continuous Infusions: . sodium chloride    . sodium chloride     PRN Meds: sodium chloride, ALPRAZolam, lactulose, levalbuterol, metoprolol tartrate, ondansetron (ZOFRAN) IV, oxyCODONE, sodium chloride flush, traMADol   Vital Signs    Vitals:   11/25/17 1937 11/26/17 0051 11/26/17 0401 11/26/17 0813  BP: 104/66 94/70 107/70 122/79  Pulse: (!) 108 (!) 103 (!) 112 (!) 122  Resp: 20 19 (!) 27 (!) 22  Temp: 98.2 F (36.8 C) 98.4 F (36.9 C) 97.8 F (36.6 C) 98.8 F (37.1 C)  TempSrc: Oral Oral Oral Oral  SpO2: 91% 90% 90%   Weight:   69.2 kg   Height:        Intake/Output Summary (Last 24 hours) at 11/26/2017 1027 Last data filed at 11/26/2017 0402 Gross per 24 hour  Intake 640 ml  Output 430 ml  Net 210 ml   Filed Weights   11/24/17 0500 11/25/17 0500 11/26/17 0401  Weight: 71.6 kg 68.8 kg 69.2 kg    Telemetry    Sinus tachycardia at 110s- Personally Reviewed  ECG    N/A  Physical Exam   GEN: No  acute distress.   Neck: No JVD Cardiac: RRR, no murmurs, rubs, or gallops. Substernal surgical scar  Respiratory: Clear to auscultation bilaterally.  GI: Soft, nontender, non-distended  MS: No edema; No deformity. Neuro:  Nonfocal  Psych: Normal affect   Labs    Chemistry Recent Labs  Lab 11/20/17 0527  11/24/17 0446 11/25/17 0409 11/26/17 0315  NA 137   < > 134* 133* 135  K 4.0   < > 3.8 3.2* 3.2*  CL 104   < > 99 97* 97*  CO2 26   < > 28 27 27   GLUCOSE 103*   < > 103* 104* 102*  BUN 5*   < > 9 9 12   CREATININE 0.95   < > 0.91 0.97 1.08  CALCIUM 9.4   < > 8.4* 8.3* 8.4*  PROT 7.0  --  5.7*  --   --   ALBUMIN 3.2*  --  2.6*  --   --   AST 45*  --  32  --   --   ALT 54*  --  28  --   --  ALKPHOS 80  --  53  --   --   BILITOT 0.5  --  1.5*  --   --   GFRNONAA >60   < > >60 >60 >60  GFRAA >60   < > >60 >60 >60  ANIONGAP 7   < > 7 9 11    < > = values in this interval not displayed.     Hematology Recent Labs  Lab 11/24/17 0446 11/25/17 0409 11/26/17 0315  WBC 13.4* 10.3 10.9*  RBC 3.62* 3.36* 3.42*  HGB 11.9* 10.9* 11.5*  HCT 37.6* 34.6* 34.9*  MCV 103.9* 103.0* 102.0*  MCH 32.9 32.4 33.6  MCHC 31.6 31.5 33.0  RDW 12.6 12.6 12.5  PLT 246 336 359    Radiology    Dg Chest 2 View  Result Date: 11/25/2017 CLINICAL DATA:  History of CABG on 11/21/2017, follow-up EXAM: CHEST - 2 VIEW COMPARISON:  Portable chest x-ray of 11/24/2017 FINDINGS: There has been some increase in bibasilar linear atelectasis. Small pleural effusions also remain better seen on the lateral view posteriorly. No pneumonia is seen and no pneumothorax is evident. Mediastinal and hilar contours are stable and heart size is stable with mild cardiomegaly present. Median sternotomy sutures are noted from CABG. No acute bony abnormality is seen. IMPRESSION: 1. Slight increase in bibasilar atelectasis with small bilateral pleural effusions present. 2. No pneumothorax. Electronically Signed   By: Ivar Drape M.D.   On: 11/25/2017 09:41    Cardiac Studies   LEFT HEART CATH AND CORONARY ANGIOGRAPHY10/10/19  Conclusion     Dist RCA lesion is 90% stenosed.  Prox RCA lesion is 30% stenosed.  Prox RCA to Mid RCA lesion is 50% stenosed.  Mid RCA lesion is 50% stenosed.  Prox Cx lesion is 75% stenosed.  Mid Cx lesion is 70% stenosed.  Ost LAD lesion is 50% stenosed.  Prox LAD-1 lesion is 50% stenosed.  Prox LAD-2 lesion is 95% stenosed.  Mid LAD lesion is 80% stenosed.  LV end diastolic pressure is normal.  The left ventricular systolic function is normal.  Severe multivessel coronary obstructive disease with diffuse 50% proximal LAD stenoses followed by 95% stenosis before the takeoff of the first diagonal vessel with 80% stenosis beyond the diagonal vessel in the mid LAD; 75 and 70% proximal left circumflex stenoses; and diffuse irregularity of the proximal and mid RCA with a narrowings up to 50% with focal 90% acute margin stenosis in a dominant RCA. Distal vessels are all very large.  Preserved global LV contractility with an ejection fraction of 55 to 60% but with a small region of mid inferior focal hypocontractility. LVEDP is 15 mmHg.  RECOMMENDATION: In this patient with severe diffuse multivessel coronary obstructive disease and excellent distal targets, recommend surgical consultation for consideration of CABG revascularization. Initiate high potency statin therapy with target LDL less than 80. Smoking cessation is imperative.  Recommend Aspirin 62m daily for moderate CAD.Will not start DAPT in anticipation of CABG revascularization surgery. Will resume heparin 10 hours post procedure.    Echo 11/13/17 Study Conclusions  - Left ventricle: The cavity size was normal. Wall thickness was normal. Systolic function was normal. The estimated ejection fraction was in the range of 60% to 65%. Wall motion was normal; there were no regional wall  motion abnormalities. Doppler parameters are consistent with abnormal left ventricular relaxation (grade 1 diastolic dysfunction). The E/e&' ratio is <8, suggesting normal LV filling pressure. - Left atrium: The atrium was normal in  size. - Inferior vena cava: The vessel was normal in size. The respirophasic diameter changes were in the normal range (>= 50%), consistent with normal central venous pressure.  Impressions:  - LVEF 60-65%, normal wall thickness, normal wall motion, grade 1 DD, normal LV filling pressure, normal LA size, normal IVC.  Patient Profile     47 y.o. male with a hx of CAD, tobacco abuse, alcohol abuse, COPDwho is being seen for the evaluation of CAD and chest painat the request of Dr. [name].Patient recently found to have 3V CAD, CABG recommended, but left AMA. Called and instructed to return back to the ER.  Assessment & Plan    1. Multivessel IRS:WNIO AMA last week and then presented back to the ED with recurrent symptoms and underwent LIMA to LAD, SVG to Distal Right, SVG to Circ. - Chest pain free and ambulating well.  Plan to discontinue epicardial pacing today.  - He is sinus tachycardiac at 110-120s. BP was soft yesterday. Now stable. Will increase metoprolol to 37.34m BID and up titrate further as blood pressure allows.  - Ideally recommends to  Stay for another day or two.   2. ETOH abuse: on CIWA protocol. No withdrawal symptoms noted. Drinks 18 beers per day. Advised cessation.   3. Tobacco uEVO:JJKKXa nicotine patch.  4. Hypokalemia: On supplement   5. HLD - 11/14/2017: Cholesterol 204; HDL 75; LDL Cholesterol 114; Triglycerides 76; VLDL 15  - on statin   [facility] will sign off.   Medication Recommendations:  Continue ASA, Lipitor (incrase to [id number]).  Metoprolol 37.63m79mID (uptitrate tomorrow if staying). Currently on IV lasix>> may be po daily or PRN per surgery. He needs potassium supplement.  Other  recommendations (labs, testing, etc):  BMET at follow up Follow up as an outpatient:  Has been arranged.   For questions or updates, please contact [facility] consult [email or web address] for contact info under     Signed, BLeanor KailA  11/26/2017, 10:27 AM    Patient seen, examined. Available data reviewed. Agree with findings, assessment, and plan as outlined by VinRobbie LisA.  The patient is alert, oriented, in no distress.  Lungs are clear, heart is regular rate and rhythm with no murmur, sternal incision appears to be healing well, abdomen is soft and nontender, extremities show no edema.  The patient appears to be stable for discharge.  His medications are reviewed and are appropriate with aspirin, a high intensity statin drug, and a beta-blocker.  Outpatient cardiology follow-up as arranged.  MicSherren Mocha.D. 11/26/2017 1:22 PM

## 2017-11-26 NOTE — Progress Notes (Signed)
CARDIAC REHAB PHASE I   PRE:  Rate/Rhythm: 110 ST  BP:  Sitting: 105/81        SaO2: 94 RA  MODE:  Ambulation: 800 ft   POST:  Rate/Rhythm: 124 ST  BP:  Sitting: 144/94        SaO2: 97 RA  1135-1232  Pt ambulated 800 ft with assistance at first, and then independently. Tolerated well. Pt denies c/o. Gait was steady. Ed complete on sternal precautions, IS, and physical restrictions. Went over Ed on smoking cessation, HH diet, Ex guidelines, and CRPII. Pt seems motivated to cease tobacco and alcohol use and willing to use available resources to do so. Referred to CRPII in [place].   [name], MS 11/26/2017 12:28 PM

## 2017-11-26 NOTE — Discharge Instructions (Signed)
Coronary Artery Bypass Grafting, Care After  This sheet gives you information about how to care for yourself after your procedure. Your health care provider may also give you more specific instructions. If you have problems or questions, contact your health care provider. What can I expect after the procedure? After the procedure, it is common to have:  Nausea and a lack of appetite.  Constipation.  Weakness and fatigue.  Depression or irritability.  Pain or discomfort in your incision areas.  Follow these instructions at home: Medicines  Take over-the-counter and prescription medicines only as told by your health care provider. Do not stop taking medicines or start any new medicines without approval from your health care provider.  If you were prescribed an antibiotic medicine, take it as told by your health care provider. Do not stop taking the antibiotic even if you start to feel better.  Do not drive or use heavy machinery while taking prescription pain medicine. Incision care  Follow instructions from your health care provider about how to take care of your incisions. Make sure you: ? Wash your hands with soap and water before you change your bandage (dressing). If soap and water are not available, use hand sanitizer. ? Change your dressing as told by your health care provider. ? Leave stitches (sutures), skin glue, or adhesive strips in place. These skin closures may need to stay in place for 2 weeks or longer. If adhesive strip edges start to loosen and curl up, you may trim the loose edges. Do not remove adhesive strips completely unless your health care provider tells you to do that.  Keep incision areas clean, dry, and protected.  Check your incision areas every day for signs of infection. Check for: ? More redness, swelling, or pain. ? More fluid or blood. ? Warmth. ? Pus or a bad smell.  If incisions were made in your legs: ? Avoid crossing your legs. ? Avoid  sitting for long periods of time. Change positions every 30 minutes. ? Raise (elevate) your legs when you are sitting. Bathing  Do not take baths, swim, or use a hot tub until your health care provider approves.  Only take sponge baths. Pat the incisions dry. Do not rub incisions with a washcloth or towel.  Ask your health care provider when you can shower. Eating and drinking  Eat foods that are high in fiber, such as raw fruits and vegetables, whole grains, beans, and nuts. Meats should be lean cut. Avoid canned, processed, and fried foods. This can help prevent constipation and is a recommended part of a heart-healthy diet.  Drink enough fluid to keep your urine clear or pale yellow.  Limit alcohol intake to no more than 1 drink a day for nonpregnant women and 2 drinks a day for men. One drink equals 12 oz of beer, 5 oz of wine, or 1 oz of hard liquor. Activity  Rest and limit your activity as told by your health care provider. You may be instructed to: ? Stop any activity right away if you have chest pain, shortness of breath, irregular heartbeats, or dizziness. Get help right away if you have any of these symptoms. ? Move around frequently for short periods or take short walks as directed by your health care provider. Gradually increase your activities. You may need physical therapy or cardiac rehabilitation to help strengthen your muscles and build your endurance. ? Avoid lifting, pushing, or pulling anything that is heavier than 10 lb (4.5 kg) for  at least 6 weeks or as told by your health care provider.  Do not drive until your health care provider approves.  Ask your health care provider when you may return to work.  Ask your health care provider when you may resume sexual activity. General instructions  Do not use any products that contain nicotine or tobacco, such as cigarettes and e-cigarettes. If you need help quitting, ask your health care provider.  Take 2-3 deep  breaths every few hours during the day, while you recover. This helps expand your lungs and prevent complications like pneumonia after surgery.  If you were given a device called an incentive spirometer, use it several times a day to practice deep breathing. Support your chest with a pillow or your arms when you take deep breaths or cough.  Wear compression stockings as told by your health care provider. These stockings help to prevent blood clots and reduce swelling in your legs.  Weigh yourself every day. This helps identify if your body is holding (retaining) fluid that may make your heart and lungs work harder.  Keep all follow-up visits as told by your health care provider. This is important. Contact a health care provider if:  You have more redness, swelling, or pain around any incision.  You have more fluid or blood coming from any incision.  Any incision feels warm to the touch.  You have pus or a bad smell coming from any incision  You have a fever.  You have swelling in your ankles or legs.  You have pain in your legs.  You gain 2 lb (0.9 kg) or more a day.  You are nauseous or you vomit.  You have diarrhea. Get help right away if:  You have chest pain that spreads to your jaw or arms.  You are short of breath.  You have a fast or irregular heartbeat.  You notice a "clicking" in your breastbone (sternum) when you move.  You have numbness or weakness in your arms or legs.  You feel dizzy or light-headed. Summary  After the procedure, it is common to have pain or discomfort in the incision areas.  Do not take baths, swim, or use a hot tub until your health care provider approves.  Gradually increase your activities. You may need physical therapy or cardiac rehabilitation to help strengthen your muscles and build your endurance.  Weigh yourself every day. This helps identify if your body is holding (retaining) fluid that may make your heart and lungs work  harder. This information is not intended to replace advice given to you by your health care provider. Make sure you discuss any questions you have with your health care provider. Document Released: 08/10/2004 Document Revised: 12/11/2015 Document Reviewed: 12/11/2015 Elsevier Interactive Patient Education  [employer].

## 2017-11-26 NOTE — Progress Notes (Signed)
      [address]       [place] [address]             [phone]      5 Days Post-Op Procedure(s) (LRB): CORONARY ARTERY BYPASS GRAFTING (CABG) times 3 using left internal mammary artery and right greater saphenous vein harvested endscopically. LIMA to LAD, SVG to Distal Right, SVG to Circ. (N/A) TRANSESOPHAGEAL ECHOCARDIOGRAM (TEE) (N/A) Subjective: Calm this morning and reading. His pain has been well controlled.   Objective: Vital signs in last 24 hours: Temp:  [97.8 F (36.6 C)-98.8 F (37.1 C)] 98.8 F (37.1 C) (10/23 0813) Pulse Rate:  [103-122] 122 (10/23 0813) Cardiac Rhythm: Sinus tachycardia (10/23 0700) Resp:  [19-27] 22 (10/23 0813) BP: (94-122)/(66-84) 122/79 (10/23 0813) SpO2:  [90 %-94 %] 90 % (10/23 0401) Weight:  [69.2 kg] 69.2 kg (10/23 0401)     Intake/Output from previous day: 10/22 0701 - 10/23 0700 In: 880 [P.O.:880] Out: 430 [Urine:430] Intake/Output this shift: No intake/output data recorded.  General appearance: alert, cooperative and no distress Heart:sinus tachycardia 114-120bpm Lungs: clear to auscultation bilaterally Abdomen: soft, non-tender; bowel sounds normal; no masses,  no organomegaly Extremities: extremities normal, atraumatic, no cyanosis or edema Wound: small amount of erythema surrounding incision. Small amount of drainage at the distal end of the sternotomy.  Lab Results: Recent Labs    11/25/17 0409 11/26/17 0315  WBC 10.3 10.9*  HGB 10.9* 11.5*  HCT 34.6* 34.9*  PLT 336 359   BMET:  Recent Labs    11/25/17 0409 11/26/17 0315  NA 133* 135  K 3.2* 3.2*  CL 97* 97*  CO2 27 27  GLUCOSE 104* 102*  BUN 9 12  CREATININE 0.97 1.08  CALCIUM 8.3* 8.4*    PT/INR: No results for input(s): LABPROT, INR in the last 72 hours. ABG    Component Value Date/Time   PHART 7.313 (L) 11/21/2017 1743   HCO3 22.2 11/21/2017 1743   TCO2 25 11/22/2017 1635   ACIDBASEDEF 4.0 (H) 11/21/2017 1743   O2SAT 60.6  11/23/2017 1959   CBG (last 3)  Recent Labs    11/25/17 1632 11/25/17 2134 11/26/17 0648  GLUCAP 102* 125* 91    Assessment/Plan: S/P Procedure(s) (LRB): CORONARY ARTERY BYPASS GRAFTING (CABG) times 3 using left internal mammary artery and right greater saphenous vein harvested endscopically. LIMA to LAD, SVG to Distal Right, SVG to Circ. (N/A) TRANSESOPHAGEAL ECHOCARDIOGRAM (TEE) (N/A)  1. CV- sinus tachycardia in the 120s. Unable to titrate BB further due to hypotension.  2. Pulm-CXR look stable.Tolerating room air with okay oxygen saturation 3. Renal- creatinine1.08,continue Lasix. Electrolytes okay 4. GI- nausea, minimal BS- will continue zofran prn 5. ETOH abuse- drinks 18 beers per day, on Thiamine, Folic Acid, Multivitamin, CIWA protocol back  6. Endo-blood glucose has been well controlled  Plan: Discontinue epicardial pacing wires today. Would like his HR to be a little better controlled before discharge but the patient is insistent on being discharged today. Will consult pharmacy for medications to be filled here.   LOS: 11 days    Greg [name] 11/26/2017

## 2017-11-26 NOTE — Discharge Summary (Signed)
[address]       [place] [address]             [phone]      Physician Discharge Summary  Patient ID: Greg [name] MRN: [id number] DOB/AGE: 49/03/1970 49 y.o.  Admit date: 11/14/2017 Discharge date: 11/26/2017  Admission Diagnoses: Patient Active Problem List   Diagnosis Date Noted  . Unstable angina pectoris ([place])   . COPD, severe ([place]) 11/19/2017  . NSTEMI (non-ST elevated myocardial infarction) ([place]) 11/15/2017  . Coronary artery disease 11/15/2017  . Alcohol abuse 11/15/2017  . Hyperlipidemia 11/15/2017  . Tobacco abuse 11/15/2017  . ACS (acute coronary syndrome) ([place]) 11/12/2017    Discharge Diagnoses:  Principal Problem:   Coronary artery disease Active Problems:   NSTEMI (non-ST elevated myocardial infarction) ([place])   Alcohol abuse   Hyperlipidemia   Tobacco abuse   COPD, severe ([place])   Unstable angina pectoris ([facility])   S/P CABG x 3   Discharged Condition: good  HPI:   [name] is a 47 year old male patient with a past medical history significant for COPD, GERD, alcoholism, and current everyday smoker who was transferred from [facility] emergency room after he presented with recurrent chest pain.  He originally went to the [facility] emergency room September 16 and was discharged home with a diagnosis of lung nodule seen on chest x-ray and unstable angina.  He returned to the [facility] emergency room his last episode of chest pain was on 11/11/2017 with prolonged episode of chest pain, was discharged home and then called back several hours later and told to come immediately back to the emergency room.  The pain is described as chest discomfort radiating to the back associated with shortness of breath, nausea, cold sweats and vomiting.  His initial troponin was slightly elevated at 0.23.  There were no significant EKG changes.  After returning to the emergency room when called he was ED he was started on heparin drip and  Nitropaste.  He was then transferred to [facility] for further care.  He ultimately underwent cardiac catheterization on 11/13/2017 at which showed severe multivessel coronary obstructive disease.  Including high-grade LAD disease .  He had preserved LV contractility with an ejection fraction of 55 to 60%.   Cardiac surgery consulted for consideration of surgical revascularization.  The patient has been debilitated and severely nutritionally depleted because of chronic abdominal pain and high levels of alcohol intake, patient admits to 12 pack of beer a day.  During our discussion the patient was very anxious to go home.  He became agitated and wanted to rip his IV out and leave .  Hospital Course:   [name] underwent a CABG x 3 on 11/21/2017 with Dr. Servando Snare. He tolerated the procedure well and was transferred to the ICU for continued care. He was extubated in a timely manner. POD 1 he was maintaining NSR and on milrinone and Neo drips. Breathing comfortably on 2L [place]. We discontinued chest tubes and lines. We added toradol for pain control. He felt short of breath but looked good from a pulmonary point of view so we added xanax for anxiety. He did have some nausea which was treated and resolved. He was placed back on CIWA protocol and vitamins for withdrawal symptoms. We increased his metoprolol for better heart rate and blood pressure control. His renal function remained stable. We transferred the patient to the step down unit for continued care. He remained tachycardic but  we couldn't titrate his beta blocker due to hypotension. We discontinued his epicardial pacing wires on 11/26/2017. He is ambulating with limited assistance, tolerating room air, his incision is healing well and he is ready for discharge home.    Consults: none.  Significant Diagnostic Studies:   CLINICAL DATA:  History of CABG on 11/21/2017, follow-up  EXAM: CHEST - 2 VIEW  COMPARISON:  Portable chest x-ray of  11/24/2017  FINDINGS: There has been some increase in bibasilar linear atelectasis. Small pleural effusions also remain better seen on the lateral view posteriorly. No pneumonia is seen and no pneumothorax is evident. Mediastinal and hilar contours are stable and heart size is stable with mild cardiomegaly present. Median sternotomy sutures are noted from CABG. No acute bony abnormality is seen.  IMPRESSION: 1. Slight increase in bibasilar atelectasis with small bilateral pleural effusions present. 2. No pneumothorax.   Electronically Signed   By: Ivar Drape M.D.   On: 11/25/2017 09:41   Treatments:   NAME: Greg [name], Greg [name] MEDICAL RECORD [id number] ACCOUNT [id number] DATE OF BIRTH:Jun 26, 1970 FACILITY: [facility] LOCATION: [facility] PHYSICIAN:EDWARD Maryruth Bun, MD  OPERATIVE REPORT  DATE OF PROCEDURE:  11/21/2017  PREOPERATIVE DIAGNOSIS:  Coronary occlusive disease with high-grade left anterior descending  disease.  POSTOPERATIVE DIAGNOSIS:  Coronary occlusive disease with high-grade left anterior descending disease.  SURGICAL PROCEDURE:  Coronary artery bypass grafting x3 with the left internal mammary to the left anterior descending coronary artery, reverse saphenous vein graft to the circumflex coronary artery, reverse saphenous vein graft to the distal right  coronary artery with right thigh endoscopic vein harvesting of the right greater saphenous vein.  SURGEON:  Lanelle Bal, MD  FIRST ASSISTANT:  Nicholes Rough, [place].  Discharge Exam: Blood pressure (!) 144/94, pulse (!) 110, temperature 98.8 F (37.1 C), temperature source Oral, resp. rate 16, height 5' 10"  (1.778 m), weight 69.2 kg, SpO2 90 %.   General appearance: alert, cooperative and mild distress Heart: sinus trachycardia Lungs: clear to auscultation bilaterally Abdomen: soft, non-tender; bowel sounds normal; no masses,  no organomegaly Extremities: extremities normal, atraumatic, no  cyanosis or edema Wound: clean and dry   Disposition: Discharge disposition: 01-Home or Self Care       Discharge Instructions    Amb Referral to Cardiac Rehabilitation   Complete by:  As directed    Referred to [place].   Diagnosis:   CABG NSTEMI     CABG X ___:  3     Allergies as of 11/26/2017   No Known Allergies     Medication List    TAKE these medications   albuterol 108 (90 Base) MCG/ACT inhaler Commonly known as:  PROVENTIL HFA;VENTOLIN HFA Inhale 2 puffs into the lungs every 6 (six) hours as needed for wheezing or shortness of breath.   aspirin 325 MG EC tablet Take 1 tablet (325 mg total) by mouth daily. Start taking on:  11/27/2017 What changed:    medication strength  how much to take   atorvastatin 80 MG tablet Commonly known as:  LIPITOR Take 1 tablet (80 mg total) by mouth daily at 6 PM.   Metoprolol Tartrate 37.5 MG Tabs Take 37.5 mg by mouth 2 (two) times daily.   nicotine 21 mg/24hr patch Commonly known as:  NICODERM CQ - dosed in mg/24 hours Place 1 patch (21 mg total) onto the skin daily. Start taking on:  11/27/2017   oxyCODONE 5 MG immediate release tablet Commonly known as:  Oxy IR/ROXICODONE Take 1 tablet (5  mg total) by mouth every 4 (four) hours as needed for severe pain.      Follow-up Information    [facility], [place], [place]. Go on 12/04/2017.   Specialty:  Cardiology Why:  @9 :30 for hospital follow up Contact information: [place] [address] [phone]        Grace Isaac, MD Follow up.   Specialty:  Cardiothoracic Surgery Why:  Your routine follow-up appointment is at 1:00pm on 12/25/2017. Please arrive at 12:30pm for a chest xray at [facility] imaging which is on the first floor of our building.  Contact information: [address] [place] [address] [phone]        Fay Records, MD. Call in 1 day(s).   Specialty:  Cardiology Contact  information: [place] [address] [phone]          The patient has been discharged on:   1.Beta Blocker:  Yes [ yes  ]                              No   [   ]                              If No, reason:  2.Ace Inhibitor/ARB: Yes [   ]                                     No  [  no  ]                                     If No, reason: titration of beta blocker  3.Statin:   Yes [ yes  ]                  No  [   ]                  If No, reason:  4.Ecasa:  Yes  [ yes  ]                  No   [   ]                  If No, reason:    Signed: Elgie Collard 11/26/2017, 12:52 PM

## 2017-11-28 NOTE — Telephone Encounter (Signed)
Error

## 2017-12-04 ENCOUNTER — Encounter: Payer: Self-pay | Admitting: Physician Assistant

## 2017-12-04 ENCOUNTER — Ambulatory Visit (INDEPENDENT_AMBULATORY_CARE_PROVIDER_SITE_OTHER): Payer: Medicaid Other | Admitting: Physician Assistant

## 2017-12-04 VITALS — BP 82/60 | HR 92 | Ht 70.0 in | Wt 155.8 lb

## 2017-12-04 DIAGNOSIS — I952 Hypotension due to drugs: Secondary | ICD-10-CM | POA: Diagnosis not present

## 2017-12-04 DIAGNOSIS — I25708 Atherosclerosis of coronary artery bypass graft(s), unspecified, with other forms of angina pectoris: Secondary | ICD-10-CM | POA: Diagnosis not present

## 2017-12-04 DIAGNOSIS — E785 Hyperlipidemia, unspecified: Secondary | ICD-10-CM

## 2017-12-04 DIAGNOSIS — I249 Acute ischemic heart disease, unspecified: Secondary | ICD-10-CM | POA: Diagnosis not present

## 2017-12-04 DIAGNOSIS — F1010 Alcohol abuse, uncomplicated: Secondary | ICD-10-CM

## 2017-12-04 DIAGNOSIS — Z720 Tobacco use: Secondary | ICD-10-CM

## 2017-12-04 NOTE — Patient Instructions (Addendum)
Medication Instructions:  Your physician recommends that you continue on your current medications as directed. Please refer to the Current Medication list given to you today.  If you need a refill on your cardiac medications before your next appointment, please call your pharmacy.   Lab work: LABS TO BE DONE IN 6 WEEKS: FASTING LIPIDS, LFTs  *PLEASE GET BMET DONE AT NEXT PRIMARY CARE DOCTOR'S APPOINTMENT If you have labs (blood work) drawn today and your tests are completely normal, you will receive your results only by: Marland Kitchen MyChart Message (if you have MyChart) OR . A paper copy in the mail If you have any lab test that is abnormal or we need to change your treatment, we will call you to review the results.  Testing/Procedures: NONE  Follow-Up: At [facility], you and your health needs are our priority.  As part of our continuing mission to provide you with exceptional heart care, we have created designated Provider Care Teams.  These Care Teams include your primary Cardiologist (physician) and Advanced Practice Providers (APPs -  Physician Assistants and Nurse Practitioners) who all work together to provide you with the care you need, when you need it. You will need a follow up appointment in:  3 months.  Please call our office 2 months in advance to schedule this appointment.  You may see Dorris Carnes, MD or one of the following Advanced Practice Providers on your designated Care Team: Richardson Dopp, PA-C [place], [place] . Daune Perch, NP  Any Other Special Instructions Will Be Listed Below (If Applicable).

## 2017-12-04 NOTE — Progress Notes (Signed)
Cardiology Office Note    Date:  12/04/2017   ID:  RICHEY DOOLITTLE, DOB [birth year], MRN [id number]  PCP:  Patient, No Pcp Per  Cardiologist:  Dr. Harrington Challenger Chief Complaint: Hospital follow up   History of Present Illness:   ADIAN JABLONOWSKI is a 47 y.o. male with a hx of CAD s/p recent CABG, tobacco abuse, alcohol abuse and COPDpresents for follow up.   Patient recently found to have 3V CAD by cath 11/13/17>> CABG recommended, but left AMA. Called and instructed to return back to the ER. underwent LIMA to LAD, SVG to Distal Right, SVG to Circ. He was drinking  18 beers per day. Advised cessation. Supplemented for low potassium.   Here today for follow up. Recovering well. Says "hasn't drink alcohol since discharge and cut back smoking to 15 cigarette/day from 2.5 pack/day". Walking without chest pain. Intermittent surgical pain. No orthopnea, PND, LE edema or melena. Compliant with medications.    Past Medical History:  Diagnosis Date  . CAD (coronary artery disease) of bypass graft    s/p LIMA to LAD, SVG to Distal Right, SVG to Circ 11/2017  . COPD (chronic obstructive pulmonary disease) ([place])   . COPD, severe ([place]) 11/19/2017   fev1 33% predicted   . Daily headache   . GERD (gastroesophageal reflux disease)   . Skin cancer    "cut off my eyelid and my neck" (11/12/2017)    Past Surgical History:  Procedure Laterality Date  . CORONARY ARTERY BYPASS GRAFT N/A 11/21/2017   Procedure: CORONARY ARTERY BYPASS GRAFTING (CABG) times 3 using left internal mammary artery and right greater saphenous vein harvested endscopically. LIMA to LAD, SVG to Distal Right, SVG to Circ.;  Surgeon: Grace Isaac, MD;  Location: [facility];  Service: Open Heart Surgery;  Laterality: N/A;  . LACERATION REPAIR     "had staples put in my head" (11/12/2017)  . LEFT HEART CATH AND CORONARY ANGIOGRAPHY N/A 11/13/2017   Procedure: LEFT HEART CATH AND CORONARY ANGIOGRAPHY;  Surgeon: Troy Sine, MD;   Location: [facility];  Service: Cardiovascular;  Laterality: N/A;  . SKIN CANCER EXCISION     "cut off my eyelid and my neck" (11/12/2017)  . TEE WITHOUT CARDIOVERSION N/A 11/21/2017   Procedure: TRANSESOPHAGEAL ECHOCARDIOGRAM (TEE);  Surgeon: Grace Isaac, MD;  Location: [facility];  Service: Open Heart Surgery;  Laterality: N/A;    Current Medications: Prior to Admission medications   Medication Sig Start Date End Date Taking? Authorizing Provider  albuterol (PROVENTIL HFA;VENTOLIN HFA) 108 (90 Base) MCG/ACT inhaler Inhale 2 puffs into the lungs every 6 (six) hours as needed for wheezing or shortness of breath.    [provider]  aspirin EC 325 MG EC tablet Take 1 tablet (325 mg total) by mouth daily. 11/27/17   Elgie Collard, PA-C  atorvastatin (LIPITOR) 80 MG tablet Take 1 tablet (80 mg total) by mouth daily at 6 PM. 11/26/17   Elgie Collard, PA-C  Metoprolol Tartrate 37.5 MG TABS Take 37.5 mg by mouth 2 (two) times daily. 11/26/17   Elgie Collard, PA-C  nicotine (NICODERM CQ - DOSED IN MG/24 HOURS) 21 mg/24hr patch Place 1 patch (21 mg total) onto the skin daily. 11/27/17   Elgie Collard, PA-C  oxyCODONE (OXY IR/ROXICODONE) 5 MG immediate release tablet Take 1 tablet (5 mg total) by mouth every 4 (four) hours as needed for severe pain. 11/26/17   Elgie Collard, PA-C  Allergies:   Patient has no known allergies.   Social History   Socioeconomic History  . Marital status: Single    Spouse name: Not on file  . Number of children: Not on file  . Years of education: Not on file  . Highest education level: Not on file  Occupational History  . Not on file  Social Needs  . Financial resource strain: Not on file  . Food insecurity:    Worry: Not on file    Inability: Not on file  . Transportation needs:    Medical: Not on file    Non-medical: Not on file  Tobacco Use  . Smoking status: Current Every Day Smoker    Packs/day: 2.50    Years: 35.00    Pack  years: 87.50    Types: Cigarettes  . Smokeless tobacco: Never Used  Substance and Sexual Activity  . Alcohol use: Yes    Alcohol/week: 84.0 standard drinks    Types: 84 Cans of beer per week    Comment: 11/12/2017 "12 cans of beer/day"  . Drug use: Yes    Types: Marijuana    Comment: 11/12/2017 "qd"  . Sexual activity: Not on file  Lifestyle  . Physical activity:    Days per week: Not on file    Minutes per session: Not on file  . Stress: Not on file  Relationships  . Social connections:    Talks on phone: Not on file    Gets together: Not on file    Attends religious service: Not on file    Active member of club or organization: Not on file    Attends meetings of clubs or organizations: Not on file    Relationship status: Not on file  Other Topics Concern  . Not on file  Social History Narrative  . Not on file     Family History:  The patient's family history includes CAD in his maternal grandfather, maternal grandmother, mother, paternal grandfather, and paternal grandmother.   ROS:   Please see the history of present illness.    ROS All other systems reviewed and are negative.   PHYSICAL EXAM:   VS:  BP (!) 82/60   Pulse 92   Ht 5' 10"  (1.778 m)   Wt 155 lb 12.8 oz (70.7 kg)   SpO2 96%   BMI 22.35 kg/m    GEN: Well nourished, well developed, in no acute distress  HEENT: normal  Neck: no JVD, carotid bruits, or masses Cardiac: RRR; no murmurs, rubs, or gallops,no edema. Surgical scar healing well with mild erythema Respiratory:  clear to auscultation bilaterally, normal work of breathing GI: soft, nontender, nondistended, + BS MS: no deformity or atrophy  Skin: warm and dry, no rash Neuro:  Alert and Oriented x 3, Strength and sensation are intact Psych: euthymic mood, full affect  Wt Readings from Last 3 Encounters:  12/04/17 155 lb 12.8 oz (70.7 kg)  11/26/17 152 lb 8 oz (69.2 kg)  11/12/17 145 lb (65.8 kg)      Studies/Labs Reviewed:   EKG:  EKG is  not ordered today.    Recent Labs: 11/22/2017: Magnesium 2.4 11/24/2017: ALT 28 11/26/2017: BUN 12; Creatinine, Ser 1.08; Hemoglobin 11.5; Platelets 359; Potassium 3.2; Sodium 135   Lipid Panel    Component Value Date/Time   CHOL 204 (H) 11/14/2017 2304   TRIG 76 11/14/2017 2304   HDL 75 11/14/2017 2304   CHOLHDL 2.7 11/14/2017 2304   VLDL 15 11/14/2017  2304   Mabel 114 (H) 11/14/2017 2304    Additional studies/ records that were reviewed today include:   LEFT HEART CATH AND CORONARY ANGIOGRAPHY10/10/19  Conclusion     Dist RCA lesion is 90% stenosed.  Prox RCA lesion is 30% stenosed.  Prox RCA to Mid RCA lesion is 50% stenosed.  Mid RCA lesion is 50% stenosed.  Prox Cx lesion is 75% stenosed.  Mid Cx lesion is 70% stenosed.  Ost LAD lesion is 50% stenosed.  Prox LAD-1 lesion is 50% stenosed.  Prox LAD-2 lesion is 95% stenosed.  Mid LAD lesion is 80% stenosed.  LV end diastolic pressure is normal.  The left ventricular systolic function is normal.  Severe multivessel coronary obstructive disease with diffuse 50% proximal LAD stenoses followed by 95% stenosis before the takeoff of the first diagonal vessel with 80% stenosis beyond the diagonal vessel in the mid LAD; 75 and 70% proximal left circumflex stenoses; and diffuse irregularity of the proximal and mid RCA with a narrowings up to 50% with focal 90% acute margin stenosis in a dominant RCA. Distal vessels are all very large.  Preserved global LV contractility with an ejection fraction of 55 to 60% but with a small region of mid inferior focal hypocontractility. LVEDP is 15 mmHg.  RECOMMENDATION: In this patient with severe diffuse multivessel coronary obstructive disease and excellent distal targets, recommend surgical consultation for consideration of CABG revascularization. Initiate high potency statin therapy with target LDL less than 80. Smoking cessation is imperative.  Recommend Aspirin  8m daily for moderate CAD.Will not start DAPT in anticipation of CABG revascularization surgery. Will resume heparin 10 hours post procedure.    Echo 11/13/17 Study Conclusions  - Left ventricle: The cavity size was normal. Wall thickness was normal. Systolic function was normal. The estimated ejection fraction was in the range of 60% to 65%. Wall motion was normal; there were no regional wall motion abnormalities. Doppler parameters are consistent with abnormal left ventricular relaxation (grade 1 diastolic dysfunction). The E/e&' ratio is <8, suggesting normal LV filling pressure. - Left atrium: The atrium was normal in size. - Inferior vena cava: The vessel was normal in size. The respirophasic diameter changes were in the normal range (>= 50%), consistent with normal central venous pressure.  Impressions:  - LVEF 60-65%, normal wall thickness, normal wall motion, grade 1 DD, normal LV filling pressure, normal LA size, normal IVC.  ASSESSMENT & PLAN:    1. CAD s/p CABG x 3 No angina. Walking everyday. Continue ASA and statin. Continue BB. BP low but asymptomatic. HR stable now.   2.  HLD - 11/14/2017: Cholesterol 204; HDL 75; LDL Cholesterol 114; Triglycerides 76; VLDL 15  - Continue high intensity statin. Recheck labs in 4 weeks.  3. Tobacco abuse - He has cut back. Planning to quit. He will discussed with Chantix with PCP. Currently on patch.   4. Hypokalemia  - Declined blood drawn. Says "will do with PCP during OV early next week".   5. Surgical incison - mild pain with erythema.  Healing well. Improving. Does not look infected.   6. Alcohol abuse - Says hasn't drink since discharge  Medication Adjustments/Labs and Tests Ordered: Current medicines are reviewed at length with the patient today.  Concerns regarding medicines are outlined above.  Medication changes, Labs and Tests ordered today are listed in the Patient Instructions  below. Patient Instructions  Medication Instructions:  Your physician recommends that you continue on your current medications as directed.  Please refer to the Current Medication list given to you today.  If you need a refill on your cardiac medications before your next appointment, please call your pharmacy.   Lab work: LABS TO BE DONE IN 6 WEEKS: FASTING LIPIDS, LFTs  *PLEASE GET BMET DONE AT NEXT PRIMARY CARE DOCTOR'S APPOINTMENT If you have labs (blood work) drawn today and your tests are completely normal, you will receive your results only by: Marland Kitchen MyChart Message (if you have MyChart) OR . A paper copy in the mail If you have any lab test that is abnormal or we need to change your treatment, we will call you to review the results.  Testing/Procedures: NONE  Follow-Up: At [facility], you and your health needs are our priority.  As part of our continuing mission to provide you with exceptional heart care, we have created designated Provider Care Teams.  These Care Teams include your primary Cardiologist (physician) and Advanced Practice Providers (APPs -  Physician Assistants and Nurse Practitioners) who all work together to provide you with the care you need, when you need it. You will need a follow up appointment in:  3 months.  Please call our office 2 months in advance to schedule this appointment.  You may see Dorris Carnes, MD or one of the following Advanced Practice Providers on your designated Care Team: Richardson Dopp, PA-C [place], [place] . Daune Perch, NP  Any Other Special Instructions Will Be Listed Below (If Applicable).      Jarrett Soho, [place]  12/04/2017 10:01 AM    [address], [place], [place]  [address] Phone: [phone]; Fax: [phone]

## 2017-12-05 ENCOUNTER — Other Ambulatory Visit: Payer: Self-pay

## 2017-12-05 DIAGNOSIS — G8918 Other acute postprocedural pain: Secondary | ICD-10-CM

## 2017-12-05 MED ORDER — OXYCODONE-ACETAMINOPHEN 5-325 MG PO TABS: 1.0000 | ORAL_TABLET | Freq: Three times a day (TID) | ORAL | 0 refills | Status: DC | PRN

## 2017-12-08 NOTE — Anesthesia Postprocedure Evaluation (Signed)
Anesthesia Post Note  Patient: Greg [name]  Procedure(s) Performed: CORONARY ARTERY BYPASS GRAFTING (CABG) times 3 using left internal mammary artery and right greater saphenous vein harvested endscopically. LIMA to LAD, SVG to Distal Right, SVG to Circ. (N/A Chest) TRANSESOPHAGEAL ECHOCARDIOGRAM (TEE) (N/A )     Patient location during evaluation: SICU Anesthesia Type: General Level of consciousness: sedated Pain management: pain level controlled Vital Signs Assessment: post-procedure vital signs reviewed and stable Respiratory status: patient remains intubated per anesthesia plan Cardiovascular status: stable Postop Assessment: no apparent nausea or vomiting Anesthetic complications: no    Last Vitals:  Vitals:   11/26/17 1047 11/26/17 1154  BP: 103/73 (!) 144/94  Pulse:  (!) 110  Resp: 16 16  Temp:    SpO2:      Last Pain:  Vitals:   11/26/17 0813  TempSrc: Oral  PainSc:                  [place] S

## 2017-12-12 ENCOUNTER — Telehealth: Payer: Self-pay

## 2017-12-12 NOTE — Telephone Encounter (Signed)
Patient's significant other, [name] contacted the office concerned about Mr. Thornton.  She stated that he has had some mood changes and some on and off confusion.  She stated that at some times he will be coherent, others not as much.  She stated that he is taking his pain medications.  When asked if he by chance was drinking, as he does have a history of ETOH abuse, she denied it.  However, she did state that the patient was at home by himself while she was at work.  I advised her to keep watch on him, if his confusion gets to the point where he is not having any lucid thoughts she needed to go to the nearest ED.  She acknowledged receipt.  Advised to keep the office updated.  She also asked if he could have a refill of his xanax.  I did advise that we could not give refills of that medication.  She acknowledged receipt.

## 2017-12-19 ENCOUNTER — Other Ambulatory Visit: Payer: Self-pay | Admitting: Cardiothoracic Surgery

## 2017-12-19 DIAGNOSIS — Z951 Presence of aortocoronary bypass graft: Secondary | ICD-10-CM

## 2017-12-22 ENCOUNTER — Ambulatory Visit (INDEPENDENT_AMBULATORY_CARE_PROVIDER_SITE_OTHER): Payer: Medicaid Other | Admitting: Surgical

## 2017-12-22 ENCOUNTER — Ambulatory Visit
Admission: RE | Admit: 2017-12-22 | Discharge: 2017-12-22 | Disposition: A | Discharge status: Home / Self Care | Payer: Medicaid Other | Source: Ambulatory Visit | Attending: Cardiothoracic Surgery | Admitting: Cardiothoracic Surgery

## 2017-12-22 ENCOUNTER — Other Ambulatory Visit: Payer: Self-pay

## 2017-12-22 VITALS — BP 145/91 | HR 71 | Resp 16 | Ht 70.0 in | Wt 146.2 lb

## 2017-12-22 DIAGNOSIS — Z951 Presence of aortocoronary bypass graft: Secondary | ICD-10-CM

## 2017-12-22 DIAGNOSIS — I2510 Atherosclerotic heart disease of native coronary artery without angina pectoris: Secondary | ICD-10-CM

## 2017-12-22 NOTE — Progress Notes (Signed)
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  POSTOPERATIVE DIAGNOSIS:  Coronary occlusive disease with high-grade left anterior descending disease.  SURGICAL PROCEDURE:  Coronary artery bypass grafting x3 with the left internal mammary to the left anterior descending coronary artery, reverse saphenous vein graft to the circumflex coronary artery, reverse saphenous vein graft to the distal right  coronary artery with right thigh endoscopic vein harvesting of the right greater saphenous vein.  SURGEON:  Lanelle Bal, MD  FIRST ASSISTANT:  Nicholes Rough, [place].   History of Present Illness:    Patient is a 47 year old male status post the above described procedure seen in the office on today's date and routine postsurgical follow-up.  Overall the patient does feel like he is improving over time.  He does have a fair amount of soreness but most pain medications cause significant nausea so he is only taking Tylenol.  He is also fairly anxious.  His primary physician recently started him on both Cymbalta and Chantix.  He has resumed smoking but to a much lesser extent than preoperatively.  He knows that smoking cessation is extremely important and is hoping these other measures will assist in quitting.  He denies fevers, chills or other significant constitutional symptoms with the exception of some fairly chronic nausea and a poor appetite..  He has followed up with his primary doctor in this regard and if it does not improve he will  need to be evaluated by gastroenterology.  He is recently started on Carafate and this is showing some minor improvement.  He is not having any anginal equivalents or shortness of breath.  Ambulation continues to improve but he is not sure if he will do the cardiac rehab program because he feels like he can do the vast majority of that on his own.  He has been seen by cardiology and they will continue to monitor him closely.  Overall he is generally pleased with his postsurgical progress.      Past Medical History:  Diagnosis Date  . CAD (coronary artery disease) of bypass graft    s/p LIMA to LAD, SVG to Distal Right, SVG to Circ 11/2017  . COPD (chronic obstructive pulmonary disease) ([place])   . 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 Daily headache   . GERD (gastroesophageal reflux disease)   . Skin cancer    "cut off my eyelid and my neck" (11/12/2017)     Social History   Tobacco Use  Smoking Status Current Every Day Smoker  . Packs/day: 2.50  . Years: 35.00  . Pack years: 87.50  . Types: Cigarettes  Smokeless Tobacco Never Used    Social History   Substance and Sexual Activity  Alcohol Use Yes  . Alcohol/week: 84.0 standard drinks  . Types: 84 Cans of beer per week   Comment: 11/12/2017 "12 cans of beer/day"     No Known Allergies  Current Outpatient Medications  Medication Sig Dispense Refill  . albuterol (PROVENTIL HFA;VENTOLIN HFA) 108 (90 Base) MCG/ACT inhaler Inhale 2 puffs into the lungs every 6 (six) hours as needed for wheezing or shortness of breath.    . ALPRAZolam (XANAX) 1 MG tablet TAKE 1 TABLET BY MOUTH BID PRA  0  . aspirin EC 325 MG EC tablet Take 1 tablet (325 mg total) by mouth daily. 30 tablet 0  . atorvastatin (LIPITOR) 80 MG tablet Take 1 tablet (80 mg total) by mouth daily at 6 PM. 30 tablet 1  . docusate sodium (COLACE) 100 MG capsule Take 100 mg by mouth 2 (two) times daily.    Marland Kitchen guaiFENesin (MUCINEX PO) Take 1 tablet by mouth 2  (two) times daily.    . Metoprolol Tartrate 37.5 MG TABS Take 37.5 mg by mouth 2 (two) times daily. 60 tablet 1  . nicotine (NICODERM CQ - DOSED IN MG/24 HOURS) 21 mg/24hr patch Place 1 patch (21 mg total) onto the skin daily. 28 patch 0  . oxyCODONE (OXY IR/ROXICODONE) 5 MG immediate release tablet Take 1 tablet (5 mg total) by mouth every 4 (four) hours as needed for severe pain. 30 tablet 0  . oxyCODONE-acetaminophen (PERCOCET/ROXICET) 5-325 MG tablet Take 1 tablet by mouth every 8 (eight) hours as needed for severe pain. 20 tablet 0   No current facility-administered medications for this visit.        Physical Exam: Ht 5' 10"  (1.778 m)   BMI 22.35 kg/m   General appearance: alert, cooperative and no distress Heart: regular rate and rhythm and S1, S2 normal Lungs: clear to auscultation bilaterally Extremities: No edema Wound: Incisions healing well without evidence of infection.  The [facility] site does have a thick scab but no drainage or erythema. Abdomen: Soft, nontender, nondistended, positive bowel sounds  Diagnostic Studies & Laboratory data:     Recent Radiology Findings:   Dg Chest 2 View  Result Date: 12/22/2017 CLINICAL DATA:  Acute chest pain.  History of CABG. EXAM: CHEST - 2 VIEW COMPARISON:  11/25/2017 FINDINGS: The cardiomediastinal silhouette is unremarkable. CABG changes identified. Mild lingular scarring noted. There is no evidence of focal airspace disease, pulmonary edema, suspicious pulmonary nodule/mass, pleural effusion, or pneumothorax. No acute bony abnormalities are identified. IMPRESSION: No evidence of acute cardiopulmonary disease. CABG changes. Electronically Signed   By: Margarette Canada M.D.   On: 12/22/2017 12:14      Recent Lab Findings: Lab Results  Component Value Date   WBC 10.9 (H) 11/26/2017   HGB 11.5 (L) 11/26/2017   HCT 34.9 (L) 11/26/2017   PLT 359 11/26/2017   GLUCOSE 102 (H) 11/26/2017   CHOL 204 (H) 11/14/2017   TRIG 76 11/14/2017    HDL 75 11/14/2017   LDLCALC 114 (H) 11/14/2017   ALT 28 11/24/2017   AST 32 11/24/2017   NA 135 11/26/2017   K 3.2 (L) 11/26/2017   CL 97 (L) 11/26/2017   CREATININE 1.08 11/26/2017   BUN 12 11/26/2017   CO2 27 11/26/2017   INR 1.19 11/21/2017   HGBA1C 5.5 11/19/2017      Assessment / Plan: Patient is overall doing well in his postsurgical recovery following multivessel CABG.  He does have a good understanding of the need for smoking cessation and will continue to work on that.  In regards to his nausea and poor appetite he will continue to follow-up with his primary care physician and may require gastroenterology consultation for further testing.  I did also  tell him to mention to his cardiologist that statin could potentially be playing a part in his nausea and they may want to try a decreased dose or a different agent.  I did not stop it at this time.  We discussed activity progression including lifting and driving protocols and he has a good understanding of this.  We will see the patient again on a as needed basis for any surgically related needs or at request.          John Giovanni, PA-C 12/22/2017 1:03 PM

## 2017-12-22 NOTE — Patient Instructions (Signed)
Discussed routine activity progression including lifting restrictions and driving.

## 2017-12-25 ENCOUNTER — Ambulatory Visit: Payer: Self-pay | Admitting: Cardiothoracic Surgery

## 2017-12-25 ENCOUNTER — Telehealth: Payer: Self-pay | Admitting: Internal Medicine

## 2017-12-25 NOTE — Telephone Encounter (Signed)
Pt fiance ([name] on chart as SO)... Advised pt bmet results.

## 2017-12-25 NOTE — Telephone Encounter (Signed)
New message   Patient's fiance states that she received a message from this office. Please advise.

## 2018-02-09 ENCOUNTER — Telehealth: Payer: Self-pay | Admitting: *Deleted

## 2018-02-09 NOTE — Telephone Encounter (Signed)
   Primary Cardiologist: Dorris Carnes, MD  48 yo male with CAD s/p CABG in 11/2017.    Chart reviewed as part of pre-operative protocol coverage. Dental cleanings and simple dental extractions are considered low risk procedures per guidelines and generally do not require any specific cardiac clearance. It is also generally accepted that for simple extractions and dental cleanings, there is no need to interrupt blood thinner therapy.   PLAN:  1. Antibiotic prophylaxis is NOT required for this patient. 2. The patient should remain on Aspirin without interruption. Please call with questions.  Call back staff: This note has been faxed to the listed dental clinic. Please call to ensure that it has been received. This note will be removed from the preop pool.  Richardson Dopp, PA-C 02/09/2018, 4:06 PM

## 2018-02-09 NOTE — Telephone Encounter (Signed)
Spoke with [place] who states they will give [place] a call back to see if they have received clearance recommendations.

## 2018-02-09 NOTE — Telephone Encounter (Signed)
   [facility] Pre-operative Risk Assessment    Request for surgical clearance:  1. What type of surgery is being performed? DENTAL EXTRACTIONS, X-RAYS, FILLINGS   2. When is this surgery scheduled? 02/16/18   3. What type of clearance is required (medical clearance vs. Pharmacy clearance to hold med vs. Both)? MEDICAL  4. Are there any medications that need to be held prior to surgery and how long?ASA ; DENTAL OFFICE ASKING IF NEED PROPHYLAXIS  5. Practice name and name of physician performing surgery? [facility]   6. What is your office phone number [phone]    7.   What is your office fax number [phone]  8.   Anesthesia type (None, local, MAC, general) ? LOCAL   Julaine Hua 02/09/2018, 1:09 PM  _________________________________________________________________   (provider comments below)

## 2018-02-10 NOTE — Telephone Encounter (Signed)
Spoke with [name] from [place] who states that they have received pre-op clearance recommendations on Greg [name].

## 2018-02-13 DIAGNOSIS — Z736 Limitation of activities due to disability: Secondary | ICD-10-CM

## 2018-03-03 ENCOUNTER — Institutional Professional Consult (permissible substitution): Payer: Medicaid Other | Admitting: Internal Medicine

## 2018-03-06 ENCOUNTER — Ambulatory Visit: Payer: Medicaid Other | Admitting: Internal Medicine

## 2018-03-06 ENCOUNTER — Encounter: Payer: Self-pay | Admitting: Internal Medicine

## 2018-03-06 VITALS — BP 138/78 | HR 89 | Ht 70.0 in | Wt 155.0 lb

## 2018-03-06 DIAGNOSIS — F17210 Nicotine dependence, cigarettes, uncomplicated: Secondary | ICD-10-CM | POA: Diagnosis not present

## 2018-03-06 DIAGNOSIS — J449 Chronic obstructive pulmonary disease, unspecified: Secondary | ICD-10-CM | POA: Diagnosis not present

## 2018-03-06 MED ORDER — ALBUTEROL SULFATE HFA 108 (90 BASE) MCG/ACT IN AERS: 2.0000 | INHALATION_SPRAY | Freq: Four times a day (QID) | RESPIRATORY_TRACT | 2 refills | Status: DC | PRN

## 2018-03-06 MED ORDER — GLYCOPYRROLATE-FORMOTEROL 9-4.8 MCG/ACT IN AERO: 2.0000 | INHALATION_SPRAY | Freq: Two times a day (BID) | RESPIRATORY_TRACT | 11 refills | Status: DC

## 2018-03-06 MED ORDER — GLYCOPYRROLATE-FORMOTEROL 9-4.8 MCG/ACT IN AERO: 2.0000 | INHALATION_SPRAY | Freq: Two times a day (BID) | RESPIRATORY_TRACT | 0 refills | Status: DC

## 2018-03-06 NOTE — Progress Notes (Signed)
Greg [name], male    DOB: 23-Feb-1970,    MRN: [id number]     Brief patient profile:  55  yowm active smoker from [place] with dx of copd referred to pulmonary clinic 03/06/2018 by  Charlott Holler. Cough x 2008 esp in am esp in winter  Needed prn saba x 2016    History of Present Illness   03/06/2018  Pulmonary/ 1st office eval/Madison Direnzo  Chief Complaint  Patient presents with  . Pulmonary Consult    Referred by Charlott Holler for eval of COPD. Pt states was told his PFT from Oct 2019 was abnormal. He had this done as part of pre-op eval for heart bypass surgery.  He states he feels SOB sometimes when he wakes up in the morning. He has an albuterol inhaler that he rarely uses.   Dyspnea:  MMRC2 = can't walk a nl pace on a flat grade s sob but does fine slow and flat  Cough: none Sleep: prone bed flat  SABA use: first thing in am  Has gen ant chest discomfort/ mscp ever since cabg 11/2017    No obvious day to day or daytime variability or assoc excess/ purulent sputum or mucus plugs or hemoptysis or  chest tightness, subjective wheeze or overt sinus or hb symptoms.   Sleeping in bed without nocturnal  or early am exacerbation  of respiratory  c/o's or need for noct saba. Also denies any obvious fluctuation of symptoms with weather or environmental changes or other aggravating or alleviating factors except as outlined above   No unusual exposure hx or h/o childhood pna/ asthma or knowledge of premature birth.  Current Allergies, Complete Past Medical History, Past Surgical History, Family History, and Social History were reviewed in [employer] record.  ROS  The following are not active complaints unless bolded Hoarseness, sore throat, dysphagia, dental problems, itching, sneezing,  nasal congestion or discharge of excess mucus or purulent secretions, ear ache,   fever, chills, sweats, unintended wt loss or wt gain, classically pleuritic or exertional cp,  orthopnea pnd or  arm/hand swelling  or leg swelling, presyncope, palpitations, abdominal pain, anorexia, nausea, vomiting, diarrhea  or change in bowel habits or change in bladder habits, change in stools or change in urine, dysuria, hematuria,  rash, arthralgias, visual complaints, headache, numbness, weakness or ataxia or problems with walking or coordination,  change in mood or  memory.             Past Medical History:  Diagnosis Date  . CAD (coronary artery disease) of bypass graft    s/p LIMA to LAD, SVG to Distal Right, SVG to Circ 11/2017  . COPD (chronic obstructive pulmonary disease) ([place])   . COPD, severe ([place]) 11/19/2017   fev1 33% predicted   . Daily headache   . GERD (gastroesophageal reflux disease)   . Skin cancer    "cut off my eyelid and my neck" (11/12/2017)    Outpatient Medications Prior to Visit  Medication Sig Dispense Refill  . albuterol (PROVENTIL HFA;VENTOLIN HFA) 108 (90 Base) MCG/ACT inhaler Inhale 2 puffs into the lungs every 6 (six) hours as needed for wheezing or shortness of breath.    Marland Kitchen aspirin EC 325 MG EC tablet Take 1 tablet (325 mg total) by mouth daily. 30 tablet 0  . atorvastatin (LIPITOR) 80 MG tablet Take 1 tablet (80 mg total) by mouth daily at 6 PM. 30 tablet 1  . Metoprolol Tartrate 37.5 MG TABS Take  37.5 mg by mouth 2 (two) times daily. 60 tablet 1  . ALPRAZolam (XANAX) 1 MG tablet TAKE 1 TABLET BY MOUTH BID PRA  0  . docusate sodium (COLACE) 100 MG capsule Take 100 mg by mouth 2 (two) times daily.    Marland Kitchen guaiFENesin (MUCINEX PO) Take 1 tablet by mouth 2 (two) times daily.    . nicotine (NICODERM CQ - DOSED IN MG/24 HOURS) 21 mg/24hr patch Place 1 patch (21 mg total) onto the skin daily. 28 patch 0  . oxyCODONE (OXY IR/ROXICODONE) 5 MG immediate release tablet Take 1 tablet (5 mg total) by mouth every 4 (four) hours as needed for severe pain. 30 tablet 0  . oxyCODONE-acetaminophen (PERCOCET/ROXICET) 5-325 MG tablet Take 1 tablet by mouth every 8 (eight) hours as  needed for severe pain. 20 tablet 0       Objective:     BP 138/78 (BP Location: Left Arm, Cuff Size: Normal)   Pulse 89   Ht 5' 10"  (1.778 m)   Wt 155 lb (70.3 kg)   SpO2 96%   BMI 22.24 kg/m   SpO2: 96 %  RA   Somber wm nad   HEENT: nl   oropharynx. Nl external ear canals without cough reflex -  Mild bilateral non-specific turbinate edema     NECK :  without JVD/Nodes/TM/ nl carotid upstrokes bilaterally   LUNGS: no acc muscle use,  Mod barrel  contour chest wall with bilateral  Distant bs s audible wheeze and  without cough on insp or exp maneuver and mod  Hyperresonant  to  percussion bilaterally     CV:  RRR  no s3 or murmur or increase in P2, and no edema   ABD:  soft and nontender with pos mid insp Hoover's  in the supine position. No bruits or organomegaly appreciated, bowel sounds nl  MS:   Nl gait/  ext warm without deformities, calf tenderness, cyanosis or clubbing No obvious joint restrictions   SKIN: warm and dry without lesions    NEURO:  alert, approp, nl sensorium with  no motor or cerebellar deficits apparent.         I personally reviewed images and agree with radiology impression as follows:  CXR:   12/22/17 No evidence of acute cardiopulmonary disease. CABG changes.      Assessment   COPD, severe ([place]) Active smoker - Spirometry 03/06/2018  FEV1 1.33  (33%)  Ratio 0.61 with mild curvature   - 03/06/2018  After extensive coaching inhaler device,  effectiveness =    75% from a baseline of <25%  Pt is Group B in terms of symptom/risk and laba/lama therefore appropriate rx at this point > lama/laba best choice for initial rx but add ics if tendency to flare on this rx    I reviewed the Fletcher curve for copd in smokers  that basically indicates   no medication on the market that has proven to alter the curve/ its downward trajectory  or the likelihood of progression of their disease(unlike other chronic medical conditions such as  atheroclerosis where we do think we can change the natural hx with risk reducing meds like his lipitor)   Treatment other than smoking cessation  is entirely directed by severity of symptoms and focused also on reducing exacerbations, not attempting to change the natural history of the disease.    He would also benefit from pulmonary rehab and will need alpha one screening on return.  Cigarette smoker 4-5 min discussion re active cigarette smoking in addition to office E&M  Ask about tobacco use:   ongoing Advise quitting   I emphasized that although we never turn away smokers from the pulmonary clinic, we do ask that they understand that the recommendations that we make  won't work nearly as well in the presence of continued cigarette exposure. In fact, we may very well  reach a point where we can't promise to help the patient if he/she can't quit smoking. (We can and will promise to try to help, we just can't promise what we recommend will really work)  Assess willingness:  Not committed at this point Assist in quit attempt:  Per PCP when ready Arrange follow up:   Follow up per Primary Care planned         Total time devoted to counseling  > 50 % of initial 60 min office visit:  review case with pt/girlfried [name]/   device teaching which extended face to face time for this visit  discussion of options/alternatives/ personally creating written customized instructions  in presence of pt  then going over those specific  Instructions directly with the pt including how to use all of the meds but in particular covering each new medication in detail and the difference between the maintenance= "automatic" meds and the prns using an action plan format for the latter (If this problem/symptom => do that organization reading Left to right).  Please see AVS from this visit for a full list of these instructions which I personally wrote for this pt and  are unique to this visit.       Christinia Gully, MD 03/06/2018

## 2018-03-06 NOTE — Patient Instructions (Addendum)
Plan A = Automatic =  Bevespi Take 2 puffs first thing in am and then another 2 puffs about 12 hours later.   Work on inhaler technique:  relax and gently blow all the way out then take a nice smooth deep breath back in, triggering the inhaler at same time you start breathing in.  Hold for up to 5 seconds if you can. Blow out thru nose. Rinse and gargle with water when done      Plan B = Backup Only use your albuterol(ventolin)  inhaler as a rescue medication to be used if you can't catch your breath by resting or doing a relaxed purse lip breathing pattern.  - The less you use it, the better it will work when you need it. - Ok to use the inhaler up to 2 puffs  every 4 hours if you must but call for appointment if use goes up over your usual need - Don't leave home without it !!  (think of it like the spare tire for your car)    The key is to stop smoking completely before smoking completely stops you!  For smoking cessation classes call [phone]       Please schedule a follow up office visit in 4 weeks, sooner if needed  with all medications /inhalers/ solutions in hand so we can verify exactly what you are taking. This includes all medications from all doctors and over the counters - pfts on return - also needs alpha one screen and refer to pulmonary rehab

## 2018-03-07 ENCOUNTER — Encounter: Payer: Self-pay | Admitting: Internal Medicine

## 2018-03-07 NOTE — Assessment & Plan Note (Addendum)
Active smoker - Spirometry 03/06/2018  FEV1 1.33  (33%)  Ratio 0.61 with mild curvature   - 03/06/2018  After extensive coaching inhaler device,  effectiveness =    75% from a baseline of <25%  Pt is Group B in terms of symptom/risk and laba/lama therefore appropriate rx at this point > lama/laba best choice for initial rx but add ics if tendency to flare on this rx    I reviewed the Fletcher curve for copd in smokers  that basically indicates   no medication on the market that has proven to alter the curve/ its downward trajectory  or the likelihood of progression of their disease(unlike other chronic medical conditions such as atheroclerosis where we do think we can change the natural hx with risk reducing meds like his lipitor)   Treatment other than smoking cessation  is entirely directed by severity of symptoms and focused also on reducing exacerbations, not attempting to change the natural history of the disease.    He would also benefit from pulmonary rehab and will need alpha one screening on return.        Total time devoted to counseling  > 50 % of initial 60 min office visit:  review case with pt/girlfried [name]/   device teaching which extended face to face time for this visit  discussion of options/alternatives/ personally creating written customized instructions  in presence of pt  then going over those specific  Instructions directly with the pt including how to use all of the meds but in particular covering each new medication in detail and the difference between the maintenance= "automatic" meds and the prns using an action plan format for the latter (If this problem/symptom => do that organization reading Left to right).  Please see AVS from this visit for a full list of these instructions which I personally wrote for this pt and  are unique to this visit.

## 2018-03-07 NOTE — Assessment & Plan Note (Addendum)

## 2018-03-09 ENCOUNTER — Ambulatory Visit: Payer: Self-pay | Admitting: Internal Medicine

## 2018-03-16 ENCOUNTER — Telehealth: Payer: Self-pay | Admitting: Internal Medicine

## 2018-03-16 NOTE — Telephone Encounter (Signed)
Spoke with [name], EC. Pt was in bed and refused to speak on the phone to me. [name] said he has c/o chest pain on and off for weeks.  Today is an especially bad episode.  She thought maybe he could get nitroglycerin since he is a heart patient.   She could not tell me if he is SOB w/ the CP  "he sees a lung doctor because he is sob sometimes". Asked about his appetite.  She said he only really eats if he gets hungry, once maybe twice a day at the most.  He is seeing a GI doctor. He has oxycodone on med list but he "does not take pain pills".  Asked if his pain is worse with activity or when he is up moving around.  She states he has been in bed most of the day and she doesn't know.  I offered sooner appt Fri am with Dr. Harrington Challenger but [name] states he really doesn't do mornings and they make his appointments for afternoon.   He is scheduled with APP on 2/19.  I gave reasons to go to the hospital if symptoms worsen or chest pain does not go away.  She thinks he probably would not go to the ER if she wanted to take him.  Aware I am forwarding to Dr. Harrington Challenger for review and if she has any recommendations we will call her back.  Otherwise we will see him on 2/19, unless he reports to ER sooner.  She verbalizes understanding and agreement with this.

## 2018-03-16 NOTE — Telephone Encounter (Signed)
New Message    Pt c/o of Chest Pain: STAT if CP now or developed within 24 hours  1. Are you having CP right now? Yes   2. Are you experiencing any other symptoms (ex. SOB, nausea, vomiting, sweating)? SOB earlier today   3. How long have you been experiencing CP?  A few days off and on  4. Is your CP continuous or coming and going? Coming and going   5. Have you taken Nitroglycerin? No, patient calling to see if he could have it prescribed.  ?

## 2018-03-20 ENCOUNTER — Encounter: Payer: Self-pay | Admitting: Gastroenterology

## 2018-03-20 ENCOUNTER — Ambulatory Visit: Payer: Medicaid Other | Admitting: Gastroenterology

## 2018-03-20 VITALS — BP 120/84 | HR 80 | Ht 70.0 in | Wt 155.5 lb

## 2018-03-20 DIAGNOSIS — R112 Nausea with vomiting, unspecified: Secondary | ICD-10-CM

## 2018-03-20 DIAGNOSIS — R1013 Epigastric pain: Secondary | ICD-10-CM

## 2018-03-20 DIAGNOSIS — K760 Fatty (change of) liver, not elsewhere classified: Secondary | ICD-10-CM

## 2018-03-20 MED ORDER — PANTOPRAZOLE SODIUM 40 MG PO TBEC: 40.0000 mg | DELAYED_RELEASE_TABLET | Freq: Every day | ORAL | 3 refills | Status: DC

## 2018-03-20 NOTE — Patient Instructions (Addendum)
You have been scheduled for an endoscopy. Please follow written instructions given to you at your visit today. If you use inhalers (even only as needed), please bring them with you on the day of your procedure. Your physician has requested that you go to www.startemmi.com and enter the access code given to you at your visit today. This web site gives a general overview about your procedure. However, you should still follow specific instructions given to you by our office regarding your preparation for the procedure.  Your provider has requested that you go to the basement level for lab work before leaving today. Press "B" on the elevator. The lab is located at the first door on the left as you exit the elevator.  Please abstain from alcohol   Please quit smoking.   Thank you for trusting me with your gastrointestinal care!    Thornton Park, MD, MPH

## 2018-03-20 NOTE — Progress Notes (Signed)
Referring Provider: No ref. provider found Primary Care Physician:  Philmore Pali, NP   Reason for Consultation: Epigastric pain and nausea   IMPRESSION:  Epigastric pain Nausea Hepatic steatosis on ultrasound at high risk for ASH    - normal liver enzymes Occasional NSAIDs (previously ibuprofen, now Goody's) Regular alcohol use    - history of heavy use    - prior DUI  Differential for epigastric pain and nausea is broad and includes (but is not limited to): PUD, esophagitis, gastritis, bile acid reflux, gastroparesis, functional symptoms. Abdominal pain and nausea may also be atypical manifestations of cardiac disease.  Atypical features for mesenteric ischemia without sitophobia.   Echogenic liver may reflect ultrasound alcohol-related liver injury. Will screen for treatable causes of liver disease that may cause an echogenic liver.   PLAN: - Pantoprazole 40 mg daily - Hepatitis C antibody, hepatitis B surface antigen, hepatitis B core antibody, fasting ferritin, fasting insulin, fasting glucose, iron, ANA, AMA, anti-smooth muscle antibody, IgG, IgM - EGD - abstain from alcohol - please quit smoking - avoid all NSAIDs - Colonoscopy when he is ready   I consented the patient at the bedside today discussing the risks, benefits, and alternatives to endoscopic evaluation. In particular, we discussed the risks that include, but are not limited to, reaction to medication, cardiopulmonary compromise, bleeding requiring blood transfusion, aspiration resulting in pneumonia, perforation requiring surgery, lack of diagnosis, severe illness requiring hospitalization, and even death. We reviewed the risk of missed lesion including polyps or even cancer. The patient acknowledges these risks and asks that we proceed.  HPI: Greg [name] is a 48 y.o. former [employer] seen in consultation at the request of NP Lam for further evaluation of abdominal pain. His girlfriend accompanies  him to this appointment. He has CAD with coronary artery bypass grafting x3 11/21/17, severe COPD, anxiety, ongoing tobacco habituation, and alcohol abuse.   Constant midabdominal pain and heaviness x years. Worsened around the time of his CABG. He thought his heart disease was the cause of the abdominal pain, but it continues despite CABG. Intermittent nausea, although only present 70% of the time. Alcohol is the only relieving features. After 7 or 8 beers he can eat.  Pain is otherwise worsened by eating. When he eats he feels like he has swallowed a brick. Weight fluctuates. Freuqnetly vomits as soon a he is finished eating. Some improvement in pain when he wasn't smoking.  Chronic heartburn. He was previously on omeprazole and pantoprazole but he didn't find them helpful. Tums only provides temporary relief.   Distnat history of heavy drinking. Case of beer and liquor daily. DUI 12 years ago. He cut back when he got a better job.   He thinks the alcohol is wearing on him after all of these years. He is also concerned about ulcers. His fiancee is worried about undiagnosed cancer.   Ibuprofen - stopped last year. Taking them daily until October. Uses acetaminophen. Occassional Goody's but he developed resulting stomach pain.   Abdominal ultrasound 11/16/2017 for abnormal liver enzymes and chronic abdominal pain revealed diffuse hepatic steatosis but was otherwise normal.  There was no evidence of cholelithiasis, biliary ductal dilatation, or other significant abnormality.  Labs from 11/24/2017 show a sodium of 134, lipase 22, AST 32, ALT 28, alk phos 53, total bilirubin 0.6.  No prior colon cancer screening. He declines any colononoscopy. "I will never have one." No family history of colon cancer or polyps.    Past  Medical History:  Diagnosis Date  . CAD (coronary artery disease) of bypass graft    s/p LIMA to LAD, SVG to Distal Right, SVG to Circ 11/2017  . COPD (chronic obstructive pulmonary  disease) ([place])   . COPD, severe ([place]) 11/19/2017   fev1 33% predicted   . Daily headache   . GERD (gastroesophageal reflux disease)   . Skin cancer    "cut off my eyelid and my neck" (11/12/2017) [place]    Past Surgical History:  Procedure Laterality Date  . CORONARY ARTERY BYPASS GRAFT N/A 11/21/2017   Procedure: CORONARY ARTERY BYPASS GRAFTING (CABG) times 3 using left internal mammary artery and right greater saphenous vein harvested endscopically. LIMA to LAD, SVG to Distal Right, SVG to Circ.;  Surgeon: Grace Isaac, MD;  Location: [facility];  Service: Open Heart Surgery;  Laterality: N/A;  . LACERATION REPAIR     "had staples put in my head" (11/12/2017)  . LEFT HEART CATH AND CORONARY ANGIOGRAPHY N/A 11/13/2017   Procedure: LEFT HEART CATH AND CORONARY ANGIOGRAPHY;  Surgeon: Troy Sine, MD;  Location: [facility];  Service: Cardiovascular;  Laterality: N/A;  . SKIN CANCER EXCISION     "cut off my eyelid and my neck" (11/12/2017)  . TEE WITHOUT CARDIOVERSION N/A 11/21/2017   Procedure: TRANSESOPHAGEAL ECHOCARDIOGRAM (TEE);  Surgeon: Grace Isaac, MD;  Location: [facility];  Service: Open Heart Surgery;  Laterality: N/A;    Current Outpatient Medications  Medication Sig Dispense Refill  . albuterol (PROVENTIL HFA;VENTOLIN HFA) 108 (90 Base) MCG/ACT inhaler Inhale 2 puffs into the lungs every 6 (six) hours as needed for wheezing or shortness of breath.    Marland Kitchen aspirin EC 325 MG EC tablet Take 1 tablet (325 mg total) by mouth daily. 30 tablet 0  . atorvastatin (LIPITOR) 80 MG tablet Take 1 tablet (80 mg total) by mouth daily at 6 PM. 30 tablet 1  . Glycopyrrolate-Formoterol (BEVESPI AEROSPHERE) 9-4.8 MCG/ACT AERO Inhale 2 puffs into the lungs 2 (two) times daily. 1 Inhaler 11  . Metoprolol Tartrate 37.5 MG TABS Take 37.5 mg by mouth 2 (two) times daily. 60 tablet 1  . pantoprazole (PROTONIX) 40 MG tablet Take 1 tablet (40 mg total) by mouth daily. 30 tablet 3   No current  facility-administered medications for this visit.     Allergies as of 03/20/2018  . (No Known Allergies)    Family History  Problem Relation Age of Onset  . CAD Mother   . CAD Maternal Grandmother   . CAD Maternal Grandfather   . CAD Paternal Grandmother   . CAD Paternal Grandfather     Social History   Socioeconomic History  . Marital status: Single    Spouse name: Not on file  . Number of children: Not on file  . Years of education: Not on file  . Highest education level: Not on file  Occupational History  . Not on file  Social Needs  . Financial resource strain: Not on file  . Food insecurity:    Worry: Not on file    Inability: Not on file  . Transportation needs:    Medical: Not on file    Non-medical: Not on file  Tobacco Use  . Smoking status: Current Every Day Smoker    Packs/day: 2.50    Years: 35.00    Pack years: 87.50    Types: Cigarettes  . Smokeless tobacco: Never Used  Substance and Sexual Activity  . Alcohol  use: Yes    Alcohol/week: 84.0 standard drinks    Types: 84 Cans of beer per week    Comment: 11/12/2017 "12 cans of beer/day"  . Drug use: Yes    Types: Marijuana    Comment: 11/12/2017 "qd"  . Sexual activity: Not on file  Lifestyle  . Physical activity:    Days per week: Not on file    Minutes per session: Not on file  . Stress: Not on file  Relationships  . Social connections:    Talks on phone: Not on file    Gets together: Not on file    Attends religious service: Not on file    Active member of club or organization: Not on file    Attends meetings of clubs or organizations: Not on file    Relationship status: Not on file  . Intimate partner violence:    Fear of current or ex partner: Not on file    Emotionally abused: Not on file    Physically abused: Not on file    Forced sexual activity: Not on file  Other Topics Concern  . Not on file  Social History Narrative  . Not on file    Review of Systems: 12 system ROS is  negative except as noted above with the additions of anxiety, back pain, vision changes, cough, fatigue, headaches, cramps, insomnia, occasional sore throat.  Filed Weights   03/20/18 1510  Weight: 155 lb 8 oz (70.5 kg)    Physical Exam: Vital signs were reviewed. General:   Alert, well-nourished, pleasant and cooperative in NAD Head:  Normocephalic and atraumatic. Eyes:  Sclera clear, no icterus.   Conjunctiva pink. Mouth:  No deformity or lesions.   Neck:  Supple; no thyromegaly. Lungs:  Clear throughout to auscultation.   No wheezes.  Heart:  Regular rate and rhythm; no murmurs Abdomen:  Soft, nontender, normal bowel sounds. No rebound or guarding. No hepatosplenomegaly Rectal:  Deferred  Msk:  Symmetrical without gross deformities. Extremities:  No gross deformities or edema. Neurologic:  Alert and  oriented x4;  grossly nonfocal Skin:  No rash or bruise. Psych:  Alert and cooperative. Normal mood and affect.   Mae Cianci L. Tarri Glenn, MD, MPH [facility] 03/21/2018, 7:39 AM

## 2018-03-21 ENCOUNTER — Encounter: Payer: Self-pay | Admitting: Gastroenterology

## 2018-03-25 ENCOUNTER — Ambulatory Visit: Payer: Self-pay | Admitting: Physician Assistant

## 2018-03-30 ENCOUNTER — Telehealth: Payer: Self-pay | Admitting: Gastroenterology

## 2018-03-30 NOTE — Telephone Encounter (Signed)
Cancelled appointment. Greg [name] from front desk called to confirm appointment and girlfriend stated pt is sick and would not be able to come for appointment tomorrow. They will call back to reschedule.

## 2018-03-31 ENCOUNTER — Encounter: Payer: Medicaid Other | Admitting: Gastroenterology

## 2018-04-03 ENCOUNTER — Ambulatory Visit: Payer: Medicaid Other | Admitting: Internal Medicine

## 2018-07-06 ENCOUNTER — Other Ambulatory Visit: Payer: Self-pay | Admitting: Internal Medicine

## 2018-07-06 MED ORDER — ALBUTEROL SULFATE HFA 108 (90 BASE) MCG/ACT IN AERS: 2.0000 | INHALATION_SPRAY | Freq: Four times a day (QID) | RESPIRATORY_TRACT | 0 refills | Status: DC | PRN

## 2018-07-30 ENCOUNTER — Other Ambulatory Visit: Payer: Self-pay | Admitting: Internal Medicine

## 2018-10-30 ENCOUNTER — Other Ambulatory Visit: Payer: Self-pay

## 2018-10-30 ENCOUNTER — Ambulatory Visit (AMBULATORY_SURGERY_CENTER): Payer: Self-pay

## 2018-10-30 VITALS — Temp 98.6°F | Ht 70.0 in | Wt 153.2 lb

## 2018-10-30 DIAGNOSIS — R1013 Epigastric pain: Secondary | ICD-10-CM

## 2018-10-30 NOTE — Progress Notes (Signed)
Denies allergies to eggs or soy products. Denies complication of anesthesia or sedation. Denies use of weight loss medication. Denies use of O2.   Emmi instructions given for endoscopy

## 2018-11-12 ENCOUNTER — Telehealth: Payer: Self-pay

## 2018-11-12 NOTE — Telephone Encounter (Signed)
Pt answered "NO" to all Covid Questions

## 2018-11-12 NOTE — Telephone Encounter (Signed)
Covid-19 screening questions   Do you now or have you had a fever in the last 14 days?  Do you have any respiratory symptoms of shortness of breath or cough now or in the last 14 days?  Do you have any family members or close contacts with diagnosed or suspected Covid-19 in the past 14 days?  Have you been tested for Covid-19 and found to be positive?       

## 2018-11-13 ENCOUNTER — Other Ambulatory Visit: Payer: Self-pay

## 2018-11-13 ENCOUNTER — Ambulatory Visit (AMBULATORY_SURGERY_CENTER): Payer: Medicaid Other | Admitting: Gastroenterology

## 2018-11-13 ENCOUNTER — Encounter: Payer: Self-pay | Admitting: Gastroenterology

## 2018-11-13 VITALS — BP 136/93 | HR 89 | Temp 98.1°F | Resp 24 | Ht 70.0 in | Wt 153.2 lb

## 2018-11-13 DIAGNOSIS — K2950 Unspecified chronic gastritis without bleeding: Secondary | ICD-10-CM | POA: Diagnosis not present

## 2018-11-13 DIAGNOSIS — K2980 Duodenitis without bleeding: Secondary | ICD-10-CM | POA: Diagnosis not present

## 2018-11-13 DIAGNOSIS — R1013 Epigastric pain: Secondary | ICD-10-CM

## 2018-11-13 DIAGNOSIS — K3189 Other diseases of stomach and duodenum: Secondary | ICD-10-CM | POA: Diagnosis not present

## 2018-11-13 MED ORDER — SODIUM CHLORIDE 0.9 % IV SOLN: 500.0000 mL | INTRAVENOUS | Status: DC

## 2018-11-13 NOTE — Progress Notes (Signed)
A/ox3, pleased with MAC, report to RN 

## 2018-11-13 NOTE — Patient Instructions (Signed)
YOU HAD AN ENDOSCOPIC PROCEDURE TODAY AT [facility]:   Refer to the procedure report that was given to you for any specific questions about what was found during the examination.  If the procedure report does not answer your questions, please call your gastroenterologist to clarify.  If you requested that your care partner not be given the details of your procedure findings, then the procedure report has been included in a sealed envelope for you to review at your convenience later.  YOU SHOULD EXPECT: Some feelings of bloating in the abdomen. Passage of more gas than usual.  Walking can help get rid of the air that was put into your GI tract during the procedure and reduce the bloating. If you had a lower endoscopy (such as a colonoscopy or flexible sigmoidoscopy) you may notice spotting of blood in your stool or on the toilet paper. If you underwent a bowel prep for your procedure, you may not have a normal bowel movement for a few days.  Please Note:  You might notice some irritation and congestion in your nose or some drainage.  This is from the oxygen used during your procedure.  There is no need for concern and it should clear up in a day or so.  SYMPTOMS TO REPORT IMMEDIATELY:    Following upper endoscopy (EGD)  Vomiting of blood or coffee ground material  New chest pain or pain under the shoulder blades  Painful or persistently difficult swallowing  New shortness of breath  Fever of 100F or higher  Black, tarry-looking stools  For urgent or emergent issues, a gastroenterologist can be reached at any hour by calling [phone].   DIET:  We do recommend a small meal at first, but then you may proceed to your regular diet.  Drink plenty of fluids but you should avoid alcoholic beverages for 24 hours.  ACTIVITY:  You should plan to take it easy for the rest of today and you should NOT DRIVE or use heavy machinery until tomorrow (because of the sedation medicines used  during the test).    FOLLOW UP: Our staff will call the number listed on your records 48-72 hours following your procedure to check on you and address any questions or concerns that you may have regarding the information given to you following your procedure. If we do not reach you, we will leave a message.  We will attempt to reach you two times.  During this call, we will ask if you have developed any symptoms of COVID 19. If you develop any symptoms (ie: fever, flu-like symptoms, shortness of breath, cough etc.) before then, please call [phone].  If you test positive for Covid 19 in the 2 weeks post procedure, please call and report this information to [place].    If any biopsies were taken you will be contacted by phone or by letter within the next 1-3 weeks.  Please call us at [phone] if you have not heard about the biopsies in 3 weeks.    SIGNATURES/CONFIDENTIALITY: You and/or your care partner have signed paperwork which will be entered into your electronic medical record.  These signatures attest to the fact that that the information above on your After Visit Summary has been reviewed and is understood.  Full responsibility of the confidentiality of this discharge information lies with you and/or your care-partner.    Handout was given to you on Gastritis. Continue taking Pantoprazole 40 mg daily Per Dr. Tarri Glenn. AVOID all non-stedoilal  anti-inflammatory medications. Example : Aspirin, Ibuprofen, Advil. Motrin, Excedrin etc.  TYLENOL is okay to take for pain if needed. You may resume your other current medications today. Await biopsy results. Recommend Colonoscopy for colon cancer screening.  Please call to set up an appointment. The office will call you with an appointment to see Dr. Tarri Glenn to review pathology results and make further additional recommendations. Please call if any questions or concerns.

## 2018-11-13 NOTE — Progress Notes (Addendum)
No problems noted in the recovery room. Maw  Pt did not want to schedule screening colonoscopy today.  He said he will schedule the day he see Dr. Tarri Glenn in the office to go over pathology results.  maw

## 2018-11-13 NOTE — Op Note (Signed)
[facility] Patient Name: Greg [name] Procedure Date: 11/13/2018 2:32 PM MRN: [id number] Endoscopist: Thornton Park MD, MD Age: 48 Referring MD:  Date of Birth: [birth year] Gender: Male Account #: [id number] Procedure:                Upper GI endoscopy Indications:              Epigastric abdominal pain, Nausea                           Hepatic steatosis on ultrasound at high risk for ASH                           - normal liver enzymes                           Occasional NSAIDs (previously ibuprofen, now                            Goody's)                           Regular alcohol use Medicines:                See the Anesthesia note for documentation of the                            administered medications Procedure:                Pre-Anesthesia Assessment:                           - Prior to the procedure, a History and Physical                            was performed, and patient medications and                            allergies were reviewed. The patient's tolerance of                            previous anesthesia was also reviewed. The risks                            and benefits of the procedure and the sedation                            options and risks were discussed with the patient.                            All questions were answered, and informed consent                            was obtained. Prior Anticoagulants: The patient has                            taken no previous anticoagulant or  antiplatelet                            agents. ASA Grade Assessment: III - A patient with                            severe systemic disease. After reviewing the risks                            and benefits, the patient was deemed in                            satisfactory condition to undergo the procedure.                           After obtaining informed consent, the endoscope was                            passed under direct vision. Throughout  the                            procedure, the patient's blood pressure, pulse, and                            oxygen saturations were monitored continuously. The                            Endoscope was introduced through the mouth, and                            advanced to the third part of duodenum. The upper                            GI endoscopy was accomplished without difficulty.                            The patient tolerated the procedure well. Scope In: Scope Out: Findings:                 The Z-line was irregular. Biopsies were taken with                            a cold forceps for histology to evaluate for                            Barrett's esophagus. Estimated blood loss was                            minimal. No esophageal varices.                           Diffuse mildly erythematous mucosa without bleeding                            was found in the gastric  fundus. No erosions or                            ulcers. Biopsies were taken with a cold forceps for                            histology. Estimated blood loss was minimal. No                            gastric varices or portal hypertensive gastropathy.                           The examined duodenum was normal. Biopsies were                            taken with a cold forceps for histology. Estimated                            blood loss was minimal. Complications:            No immediate complications. Estimated blood loss:                            Minimal. Estimated Blood Loss:     Estimated blood loss was minimal. Impression:               - Z-line irregular. Biopsied.                           - Erythematous mucosa in the gastric fundus.                            Biopsied.                           - Normal examined duodenum. Biopsied. Recommendation:           - Patient has a contact number available for                            emergencies. The signs and symptoms of potential                             delayed complications were discussed with the                            patient. Return to normal activities tomorrow.                            Written discharge instructions were provided to the                            patient.                           - Resume previous diet today.                           -  Continue present medications. Continue                            pantoprazole 40 mg daily.                           - Avoid all NSAIDs.                           - Await pathology results.                           - Colonoscopy for colon cancer screening                            recommended.                           - Follow-up appointment to review these results and                            make additional recommendations. Thornton Park MD, MD 11/13/2018 3:00:12 PM This report has been signed electronically.

## 2018-11-16 ENCOUNTER — Encounter: Payer: Self-pay | Admitting: *Deleted

## 2018-11-17 ENCOUNTER — Telehealth: Payer: Self-pay | Admitting: *Deleted

## 2018-11-17 NOTE — Telephone Encounter (Signed)
  Follow up Call-  Call back number 11/13/2018  Post procedure Call Back phone  # [phone]  Permission to leave phone message Yes  Some recent data might be hidden     Patient questions:  Do you have a fever, pain , or abdominal swelling? No. Pain Score  0 *  Have you tolerated food without any problems? Yes.    Have you been able to return to your normal activities? Yes.    Do you have any questions about your discharge instructions: Diet   No. Medications  No. Follow up visit  No.  Do you have questions or concerns about your Care? No.  Actions: * If pain score is 4 or above: No action needed, pain <4.

## 2018-11-20 ENCOUNTER — Encounter: Payer: Self-pay | Admitting: Gastroenterology

## 2018-12-17 ENCOUNTER — Ambulatory Visit: Payer: Medicaid Other | Admitting: Gastroenterology

## 2019-01-03 NOTE — Progress Notes (Signed)
01/03/2019 MANSFIELD DANN [id number] 06/23/1970   History of Present Illness:  Greg [name] is a 48 year old male with a past medical history of anxiety, asthma, COPD, hypertension, NSTEMI 11/2017, s/p CAD s/p 3 vessel CABG 11/21/2017, active smoker, alcohol abuse, fatty liver and GERD.  He presents today to review his upper endoscopy results.  He underwent an EGD 11/13/2018 which identified reflux without evidence of Barrett's esophagus, peptic duodenitis and mild chronic gastritis.  No evidence of Helicobacter pylori.  He remains on Pantoprazole 40 mg once daily.  He complains of having generalized abdominal pain that occurs daily for 2 to 3 weeks then abates for 1 week at a time which has occurred over the past 3 to 4 years.  He cannot describe the quality of this pain.  Eating might decrease this discomfort.  He passes 2-3 brown mud-like stools daily for the past 2 years.  No rectal bleeding or melena.  No recent antibiotics.  His appetite is decreased.  No nausea or vomiting.  He typically eats 2 eggs and 1 or 2 pieces of toast daily.  No coffee. He drinks 3-4 beers daily.  No drug use.  He smokes 1-1/2 pack of cigarettes daily.  He denies having any weight loss.  No fever, sweats or chills.  His father was an alcoholic and he died from liver cancer at the age of 36.  He previously declined colon cancer screening.  No family history of gastric or colorectal cancer.  He takes Aspirin 325 mg once daily with a history of CAD as reported above.  His stress level is significantly elevated at this time.  His dog of 18 years is dying and he wishes to go home to be with his dog.    EGD 11/13/2018 by Dr. Tarri Glenn: - Z-line irregular. Biopsied. - Erythematous mucosa in the gastric fundus. Biopsied. - Normal examined duodenum. Biopsied. Diagnosis 1. Surgical [P], duodenal - PEPTIC DUODENITIS. 2. Surgical [P], gastric - GASTRIC ANTRAL MUCOSA WITH MILD REACTIVE GASTROPATHY. - GASTRIC OXYNTIC  MUCOSA WITH MILD CHRONIC GASTRITIS. - WARTHIN-STARRY STAIN IS NEGATIVE FOR HELICOBACTER PYLORI. 3. Surgical [P], distal esophagus - GASTROESOPHAGEAL JUNCTION MUCOSA WITH REACTIVE/REGENERATIVE CHANGES. - NEGATIVE FOR INTESTINAL METAPLASIA (GOBLET CELL METAPLASIA).  Abdominal sonogram 11/16/2017: Diffuse hepatic steatosis. No evidence of cholelithiasis, biliary ductal dilatation, or other significant abnormality.  Past Medical History:  Diagnosis Date  . Anxiety   . Asthma   . CAD (coronary artery disease) of bypass graft    s/p LIMA to LAD, SVG to Distal Right, SVG to Circ 11/2017  . COPD (chronic obstructive pulmonary disease) ([place])   . COPD, severe ([place]) 11/19/2017   fev1 33% predicted   . Daily headache   . GERD (gastroesophageal reflux disease)   . Hypertension   . Myocardial infarction ([place])   . Skin cancer    "cut off my eyelid and my neck" (11/12/2017) [place]   Past Surgical History:  Procedure Laterality Date  . CORONARY ARTERY BYPASS GRAFT N/A 11/21/2017   Procedure: CORONARY ARTERY BYPASS GRAFTING (CABG) times 3 using left internal mammary artery and right greater saphenous vein harvested endscopically. LIMA to LAD, SVG to Distal Right, SVG to Circ.;  Surgeon: Grace Isaac, MD;  Location: [facility];  Service: Open Heart Surgery;  Laterality: N/A;  . LACERATION REPAIR     "had staples put in my head" (11/12/2017)  . LEFT HEART CATH AND CORONARY ANGIOGRAPHY N/A 11/13/2017   Procedure: LEFT HEART CATH AND  CORONARY ANGIOGRAPHY;  Surgeon: Troy Sine, MD;  Location: [facility];  Service: Cardiovascular;  Laterality: N/A;  . SKIN CANCER EXCISION     "cut off my eyelid and my neck" (11/12/2017)  . TEE WITHOUT CARDIOVERSION N/A 11/21/2017   Procedure: TRANSESOPHAGEAL ECHOCARDIOGRAM (TEE);  Surgeon: Grace Isaac, MD;  Location: [facility];  Service: Open Heart Surgery;  Laterality: N/A;   Current Outpatient Medications on File Prior to Visit  Medication Sig Dispense  Refill  . albuterol (VENTOLIN HFA) 108 (90 Base) MCG/ACT inhaler Inhale 2 puffs into the lungs every 6 (six) hours as needed for wheezing or shortness of breath. 1 Inhaler 0  . ALPRAZolam (XANAX) 1 MG tablet Take 1 mg by mouth 2 (two) times daily.    Marland Kitchen aspirin EC 325 MG EC tablet Take 1 tablet (325 mg total) by mouth daily. 30 tablet 0  . atorvastatin (LIPITOR) 80 MG tablet Take 1 tablet (80 mg total) by mouth daily at 6 PM. 30 tablet 1  . Glycopyrrolate-Formoterol (BEVESPI AEROSPHERE) 9-4.8 MCG/ACT AERO Inhale 2 puffs into the lungs 2 (two) times daily. 1 Inhaler 11  . Metoprolol Tartrate 37.5 MG TABS Take 37.5 mg by mouth 2 (two) times daily. 60 tablet 1  . pantoprazole (PROTONIX) 40 MG tablet Take 1 tablet (40 mg total) by mouth daily. 30 tablet 3   No current facility-administered medications on file prior to visit.    No Known Allergies  Current Medications, Allergies, Past Medical History, Past Surgical History, Family History and Social History were reviewed in [employer] record.  Physical Exam: BP (!) 150/96   Pulse 80   Temp 98.4 F (36.9 C)   Ht 5' 10"  (1.778 m)   Wt 157 lb 4 oz (71.3 kg)   BMI 22.56 kg/m  General: Thin, anxious 48 year old male in no acute distress Head: Normocephalic and atraumatic Eyes:  sclerae anicteric, conjunctiva pink  Ears: Normal auditory acuity Mouth: Upper and lower dentures intact, no ulcers or lesions Lungs: Clear throughout to auscultation Heart: Regular rate and rhythm Abdomen: Soft, non tender and non distended. No masses, no hepatomegaly. Normal bowel sounds x4 quadrants.   Rectal: Deferred Musculoskeletal: Symmetrical with no gross deformities  Extremities: No edema  Neurological: Alert oriented x 4, grossly nonfocal Psychological:  Alert and cooperative. Normal mood and affect  Assessment and Recommendations:  1. GERD -Continue Pantoprazole 82m QD  2.  Generalized abdominal pain -CBC, CMP, CRP  -Abdominal/pelvic CT with oral and IV contrast, patient aware he must complete the above lab order at least 2 days prior to proceeding with the CAT scan with IV contrast -Patient declined prescription for Dicyclomine -Further evaluation to be determined after CTAP and lab results received -Follow-up in the office in 4 to 6 weeks -Patient to call the office if his symptoms worsen  3.  Chronic diarrhea -Celiac panel, CRP,  see other labs ordered in #2 -The patient has previously declined scheduling a screening colonoscopy, however, due to his generalized abdominal pain and chronic diarrhea he is willing to consider scheduling a colonoscopy after he completes the abdominal/pelvic CT  3. Hepatic steatosis.  AST 32.  ALT 28. 11/2017 -await CMP results, if LFTs elevated he will require further hepatology laboratory studies   4.  Macrocytic anemia. Hemoglobin 11.5.  Hematocrit 34.9.  MCV 102.0.   5.  Alcohol abuse -Recommended reducing alcohol intake  6.  History of CAD on Aspirin 325 mg daily, Lipitor and  Metoprolol

## 2019-01-04 ENCOUNTER — Encounter: Payer: Self-pay | Admitting: Nurse Practitioner

## 2019-01-04 ENCOUNTER — Ambulatory Visit (INDEPENDENT_AMBULATORY_CARE_PROVIDER_SITE_OTHER): Payer: Medicaid Other | Admitting: Nurse Practitioner

## 2019-01-04 VITALS — BP 150/96 | HR 80 | Temp 98.4°F | Ht 70.0 in | Wt 157.2 lb

## 2019-01-04 DIAGNOSIS — K219 Gastro-esophageal reflux disease without esophagitis: Secondary | ICD-10-CM | POA: Diagnosis not present

## 2019-01-04 DIAGNOSIS — R1013 Epigastric pain: Secondary | ICD-10-CM

## 2019-01-04 DIAGNOSIS — K529 Noninfective gastroenteritis and colitis, unspecified: Secondary | ICD-10-CM | POA: Insufficient documentation

## 2019-01-04 DIAGNOSIS — R1084 Generalized abdominal pain: Secondary | ICD-10-CM

## 2019-01-04 NOTE — Patient Instructions (Signed)
If you are age 50 or older, your body mass index should be between 23-30. Your Body mass index is 22.56 kg/m. If this is out of the aforementioned range listed, please consider follow up with your Primary Care Provider.  If you are age 79 or younger, your body mass index should be between 19-25. Your Body mass index is 22.56 kg/m. If this is out of the aformentioned range listed, please consider follow up with your Primary Care Provider.   Your provider has requested that you go to the basement level for lab work before leaving today. Press "B" on the elevator. The lab is located at the first door on the left as you exit the elevator.  You have been scheduled for a CT scan of the abdomen and pelvis at [facility] ([address]---this is in the same building as [facility]).   You are scheduled on 01/13/2019 at 12:00pm. You should arrive 15 minutes prior to your appointment time for registration. Please follow the written instructions below on the day of your exam:  WARNING: IF YOU ARE ALLERGIC TO IODINE/X-RAY DYE, PLEASE NOTIFY RADIOLOGY IMMEDIATELY AT [phone]! YOU WILL BE GIVEN A 13 HOUR PREMEDICATION PREP.  1) Do not eat or drink anything after 8:00am (4 hours prior to your test) 2) You have been given 2 bottles of oral contrast to drink. The solution may taste better if refrigerated, but do NOT add ice or any other liquid to this solution. Shake well before drinking.    Drink 1 bottle of contrast @ 10:00am (2 hours prior to your exam)  Drink 1 bottle of contrast @ 11:00am (1 hour prior to your exam)  You may take any medications as prescribed with a small amount of water, if necessary. If you take any of the following medications: METFORMIN, GLUCOPHAGE, GLUCOVANCE, AVANDAMET, RIOMET, FORTAMET, [place] MET, JANUMET, GLUMETZA or METAGLIP, you MAY be asked to HOLD this medication 48 hours AFTER the exam.  The purpose of you drinking the oral contrast is to aid in the  visualization of your intestinal tract. The contrast solution may cause some diarrhea. Depending on your individual set of symptoms, you may also receive an intravenous injection of x-ray contrast/dye. Plan on being at [facility] for 30 minutes or longer, depending on the type of exam you are having performed.  This test typically takes 30-45 minutes to complete.  If you have any questions regarding your exam or if you need to reschedule, you may call the CT department at [phone] between the hours of 8:00 am and 5:00 pm, Monday-Friday.________________________________________________________________________  We have discussed scheduling a colonoscopy in the near future for you.  Follow up in 6 weeks, please call to schedule.

## 2019-01-04 NOTE — Progress Notes (Signed)
Reviewed and agree with management plans. Colonoscopy recommended if CT scan does not provide a diagnosis.  Clancy Mullarkey L. Tarri Glenn, MD, MPH

## 2019-01-13 ENCOUNTER — Other Ambulatory Visit: Payer: Self-pay

## 2019-01-13 ENCOUNTER — Ambulatory Visit (HOSPITAL_COMMUNITY)
Admission: RE | Admit: 2019-01-13 | Discharge: 2019-01-13 | Disposition: A | Discharge status: Home / Self Care | Payer: Medicaid Other | Source: Ambulatory Visit | Attending: Nurse Practitioner | Admitting: Nurse Practitioner

## 2019-01-13 ENCOUNTER — Encounter (HOSPITAL_COMMUNITY): Payer: Self-pay

## 2019-01-13 DIAGNOSIS — R1013 Epigastric pain: Secondary | ICD-10-CM | POA: Diagnosis present

## 2019-01-13 MED ORDER — SODIUM CHLORIDE (PF) 0.9 % IJ SOLN
INTRAMUSCULAR | Status: AC
  Filled 2019-01-13: qty 50

## 2019-01-13 MED ORDER — IOHEXOL 300 MG/ML  SOLN
100.0000 mL | Freq: Once | INTRAMUSCULAR | Status: AC | PRN
  Administered 2019-01-13: 100 mL via INTRAVENOUS

## 2019-01-15 ENCOUNTER — Telehealth: Payer: Self-pay | Admitting: Nurse Practitioner

## 2019-01-15 NOTE — Telephone Encounter (Signed)
CT 01/13/2019: -No acute abnormalities. -Stable left adrenal adenoma.  Does not need to be worked up. -Please send above copy to family physician and also to the patient's family.  Plan as per previous note.  RG

## 2019-01-15 NOTE — Telephone Encounter (Signed)
Pt's wife inquired about CT results.

## 2019-01-15 NOTE — Telephone Encounter (Signed)
Please review and advise.

## 2019-01-18 NOTE — Telephone Encounter (Signed)
Called and spoke with patient's wife-Greg [name]-verified DPR-Greg [name] informed of result note and MD recommendations; Greg [name] is agreeable with plan of care and verified PCP; result note routed to PCP per MD recommendations; Greg [name] verbalized understanding of information/instructions;  Greg [name] was advised to call the office at [phone] if questions/concerns arise; Greg [name] declined to make a f/u appt for the patient at this time as she is unsure he wants to have the appt made; Greg [name] reports she will have the patient return a call to the office when he wants to make the f/u appt as advised by MD;

## 2019-02-16 ENCOUNTER — Other Ambulatory Visit: Payer: Self-pay | Admitting: Gastroenterology

## 2019-03-02 ENCOUNTER — Other Ambulatory Visit: Payer: Self-pay | Admitting: Internal Medicine

## 2019-03-08 ENCOUNTER — Telehealth: Payer: Self-pay | Admitting: Internal Medicine

## 2019-03-08 MED ORDER — BEVESPI AEROSPHERE 9-4.8 MCG/ACT IN AERO: 2.0000 | INHALATION_SPRAY | Freq: Two times a day (BID) | RESPIRATORY_TRACT | 0 refills | Status: DC

## 2019-03-08 NOTE — Telephone Encounter (Signed)
Spoke with pt's girlfriend [name], and explained that the pt needs appt for refills  I have sent a 1 month supply only  She states she will call back to schedule his appt before this runs out  Nothing further needed

## 2019-03-14 ENCOUNTER — Other Ambulatory Visit: Payer: Self-pay | Admitting: Gastroenterology

## 2019-06-28 ENCOUNTER — Telehealth: Payer: Self-pay | Admitting: Internal Medicine

## 2019-06-28 NOTE — Telephone Encounter (Signed)
LMTCB

## 2019-06-28 NOTE — Telephone Encounter (Signed)
New Message      Pt c/o of Chest Pain: STAT if CP now or developed within 24 hours  1. Are you having CP right now?  Pts wife says pt has been having chest tightness since Thursday of last week. Pt is also saying he is having pain in his right shoulder.   2. Are you experiencing any other symptoms (ex. SOB, nausea, vomiting, sweating)? Pts wife says sometimes in the mornings he is getting an upset stomach after he eats. No other symptoms   3. How long have you been experiencing CP? Thursday of Last week   4. Is your CP continuous or coming and going? Pts wife is not sure   5. Have you taken Nitroglycerin? Pts wife thinks pt had used it last week and has used it from time to time.  ?

## 2019-06-29 NOTE — Progress Notes (Deleted)
Cardiology Office Note    Date:  06/29/2019   ID:  Greg [name], Greg [name], Greg [name], MRN [id number]  PCP:  Philmore Pali, NP  Cardiologist: Dorris Carnes, MD EPS: None  No chief complaint on file.   History of Present Illness:  Greg [name] is a 49 y.o. male with history of CAD status post CABG times 04/2017 LIMA to the LAD, SVG to distal RCA, SVG to circumflex, history of COPD with tobacco abuse, alcohol abuse.  Normal LVEF at that time.  Patient has not been seen in our office since 11/2017 which time he was doing well.  Patient added onto my schedule today because his wife called in saying he was having recurrent chest pain relieved with nitroglycerin. He has cancelled multiple appointments.  Past Medical History:  Diagnosis Date  . Anxiety   . Asthma   . CAD (coronary artery disease) of bypass graft    s/p LIMA to LAD, SVG to Distal Right, SVG to Circ 11/2017  . COPD (chronic obstructive pulmonary disease) ([place])   . COPD, severe ([place]) 11/19/2017   fev1 33% predicted   . Daily headache   . GERD (gastroesophageal reflux disease)   . Hypertension   . Myocardial infarction ([place])   . Skin cancer    "cut off my eyelid and my neck" (11/12/2017) [place]    Past Surgical History:  Procedure Laterality Date  . CORONARY ARTERY BYPASS GRAFT N/A 11/21/2017   Procedure: CORONARY ARTERY BYPASS GRAFTING (CABG) times 3 using left internal mammary artery and right greater saphenous vein harvested endscopically. LIMA to LAD, SVG to Distal Right, SVG to Circ.;  Surgeon: Grace Isaac, MD;  Location: [facility];  Service: Open Heart Surgery;  Laterality: N/A;  . LACERATION REPAIR     "had staples put in my head" (11/12/2017)  . LEFT HEART CATH AND CORONARY ANGIOGRAPHY N/A 11/13/2017   Procedure: LEFT HEART CATH AND CORONARY ANGIOGRAPHY;  Surgeon: Troy Sine, MD;  Location: [facility];  Service: Cardiovascular;  Laterality: N/A;  . SKIN CANCER EXCISION     "cut off my eyelid and my  neck" (11/12/2017)  . TEE WITHOUT CARDIOVERSION N/A 11/21/2017   Procedure: TRANSESOPHAGEAL ECHOCARDIOGRAM (TEE);  Surgeon: Grace Isaac, MD;  Location: [facility];  Service: Open Heart Surgery;  Laterality: N/A;    Current Medications: No outpatient medications have been marked as taking for the 06/30/19 encounter (Appointment) with Imogene Burn, PA-C.     Allergies:   Patient has no known allergies.   Social History   Socioeconomic History  . Marital status: Married    Spouse name: Not on file  . Number of children: 2  . Years of education: Not on file  . Highest education level: Not on file  Occupational History  . Occupation: unemployed  Tobacco Use  . Smoking status: Current Every Day Smoker    Packs/day: 2.50    Years: 35.00    Pack years: 87.50    Types: Cigarettes  . Smokeless tobacco: Never Used  Substance and Sexual Activity  . Alcohol use: Yes    Alcohol/week: 84.0 standard drinks    Types: 84 Cans of beer per week    Comment: 3-4 beers daily  . Drug use: Yes    Types: Marijuana    Comment: 11/12/2017 "qd"  . Sexual activity: Not on file  Other Topics Concern  . Not on file  Social History Narrative  . Not on file  Social Determinants of Health   Financial Resource Strain:   . Difficulty of Paying Living Expenses:   Food Insecurity:   . Worried About Charity fundraiser in the Last Year:   . Arboriculturist in the Last Year:   Transportation Needs:   . Film/video editor (Medical):   Marland Kitchen Lack of Transportation (Non-Medical):   Physical Activity:   . Days of Exercise per Week:   . Minutes of Exercise per Session:   Stress:   . Feeling of Stress :   Social Connections:   . Frequency of Communication with Friends and Family:   . Frequency of Social Gatherings with Friends and Family:   . Attends Religious Services:   . Active Member of Clubs or Organizations:   . Attends Archivist Meetings:   Marland Kitchen Marital Status:      Family  History:  The patient's ***family history includes CAD in his maternal grandfather, maternal grandmother, mother, paternal grandfather, and paternal grandmother.   ROS:   Please see the history of present illness.    ROS All other systems reviewed and are negative.   PHYSICAL EXAM:   VS:  There were no vitals taken for this visit.  Physical Exam  GEN: Well nourished, well developed, in no acute distress  HEENT: normal  Neck: no JVD, carotid bruits, or masses Cardiac:RRR; no murmurs, rubs, or gallops  Respiratory:  clear to auscultation bilaterally, normal work of breathing GI: soft, nontender, nondistended, + BS Ext: without cyanosis, clubbing, or edema, Good distal pulses bilaterally MS: no deformity or atrophy  Skin: warm and dry, no rash Neuro:  Alert and Oriented x 3, Strength and sensation are intact Psych: euthymic mood, full affect  Wt Readings from Last 3 Encounters:  11/[name]/20 157 lb 4 oz (71.3 kg)  11/13/18 153 lb 3.2 oz (69.5 kg)  10/30/18 153 lb 3.2 oz (69.5 kg)      Studies/Labs Reviewed:   EKG:  EKG is*** ordered today.  The ekg ordered today demonstrates ***  Recent Labs: No results found for requested labs within last 8760 hours.   Lipid Panel    Component Value Date/Time   CHOL 204 (H) 11/14/2017 2304   TRIG 76 11/14/2017 2304   HDL 75 11/14/2017 2304   CHOLHDL 2.7 11/14/2017 2304   VLDL 15 11/14/2017 2304   LDLCALC 114 (H) 11/14/2017 2304    Additional studies/ records that were reviewed today include:     LEFT HEART CATH AND CORONARY ANGIOGRAPHY  11/13/17  Conclusion       Dist RCA lesion is 90% stenosed.  Prox RCA lesion is [name]% stenosed.  Prox RCA to Mid RCA lesion is 50% stenosed.  Mid RCA lesion is 50% stenosed.  Prox Cx lesion is 75% stenosed.  Mid Cx lesion is 70% stenosed.  Ost LAD lesion is 50% stenosed.  Prox LAD-1 lesion is 50% stenosed.  Prox LAD-2 lesion is 95% stenosed.  Mid LAD lesion is 80% stenosed.  LV end  diastolic pressure is normal.  The left ventricular systolic function is normal.   Severe multivessel coronary obstructive disease with diffuse 50% proximal LAD stenoses followed by 95% stenosis before the takeoff of the first diagonal vessel with 80% stenosis beyond the diagonal vessel in the mid LAD; 75 and 70% proximal left circumflex stenoses; and diffuse irregularity of the proximal and mid RCA with a narrowings up to 50% with focal 90% acute margin stenosis in a dominant RCA.  Distal vessels are all very large.   Preserved global LV contractility with an ejection fraction of 55 to 60% but with a small region of mid inferior focal hypocontractility.  LVEDP is 15 mmHg.   RECOMMENDATION: In this patient with severe diffuse multivessel coronary obstructive disease and excellent distal targets, recommend surgical consultation for consideration of CABG revascularization.  Initiate high potency statin therapy with target LDL less than 80.  Smoking cessation is imperative.   Recommend Aspirin 51m daily for moderate CAD.  Will not start DAPT in anticipation of CABG revascularization surgery.  Will resume heparin 10 hours post procedure.      Echo 11/13/17 Study Conclusions   - Left ventricle: The cavity size was normal. Wall thickness was   normal. Systolic function was normal. The estimated ejection   fraction was in the range of 60% to 65%. Wall motion was normal;   there were no regional wall motion abnormalities. Doppler   parameters are consistent with abnormal left ventricular   relaxation (grade 1 diastolic dysfunction). The E/e&' ratio is <8,   suggesting normal LV filling pressure. - Left atrium: The atrium was normal in size. - Inferior vena cava: The vessel was normal in size. The   respirophasic diameter changes were in the normal range (>= 50%),   consistent with normal central venous pressure.   Impressions:   - LVEF 60-65%, normal wall thickness, normal wall motion, grade  1   DD, normal LV filling pressure, normal LA size, normal IVC.       ASSESSMENT:    1. Coronary artery disease involving coronary bypass graft of native heart without angina pectoris   2. Hyperlipidemia, unspecified hyperlipidemia type   3. Tobacco abuse   4. Chronic obstructive pulmonary disease, unspecified COPD type ([place]   5. ETOH abuse      PLAN:  In order of problems listed above:  CAD status post CABG 3 in 2019  Hyperlipidemia  Tobacco abuse  COPD  Alcohol abuse  Medication Adjustments/Labs and Tests Ordered: Current medicines are reviewed at length with the patient today.  Concerns regarding medicines are outlined above.  Medication changes, Labs and Tests ordered today are listed in the Patient Instructions below. There are no Patient Instructions on file for this visit.   SSumner Boast PA-C  06/29/2019 2:49 PM    [facility] [address] [place]  [id number]: [phone] Fax: [phone]

## 2019-06-29 NOTE — Telephone Encounter (Signed)
lpmtcb 5/25

## 2019-06-29 NOTE — Telephone Encounter (Signed)
I spoke to the patient's wife who informed me that the patient continues to have intermittent CP, relieved by Nitro.  He has an appointment in early June, but will try to get him in sooner for evaluation.

## 2019-06-30 ENCOUNTER — Ambulatory Visit: Payer: Medicaid Other | Admitting: Physician Assistant

## 2019-07-03 ENCOUNTER — Emergency Department (HOSPITAL_COMMUNITY): Payer: Medicaid Other

## 2019-07-03 ENCOUNTER — Emergency Department (HOSPITAL_COMMUNITY)
Admission: EM | Admit: 2019-07-03 | Discharge: 2019-07-03 | Disposition: A | Discharge status: Home / Self Care | Payer: Medicaid Other | Attending: Emergency Medicine | Admitting: Emergency Medicine

## 2019-07-03 DIAGNOSIS — R251 Tremor, unspecified: Secondary | ICD-10-CM

## 2019-07-03 DIAGNOSIS — R55 Syncope and collapse: Secondary | ICD-10-CM | POA: Diagnosis not present

## 2019-07-03 DIAGNOSIS — Z79899 Other long term (current) drug therapy: Secondary | ICD-10-CM | POA: Diagnosis not present

## 2019-07-03 DIAGNOSIS — I259 Chronic ischemic heart disease, unspecified: Secondary | ICD-10-CM | POA: Insufficient documentation

## 2019-07-03 DIAGNOSIS — I10 Essential (primary) hypertension: Secondary | ICD-10-CM | POA: Diagnosis not present

## 2019-07-03 DIAGNOSIS — R569 Unspecified convulsions: Secondary | ICD-10-CM | POA: Diagnosis present

## 2019-07-03 DIAGNOSIS — R258 Other abnormal involuntary movements: Secondary | ICD-10-CM | POA: Diagnosis not present

## 2019-07-03 DIAGNOSIS — J449 Chronic obstructive pulmonary disease, unspecified: Secondary | ICD-10-CM | POA: Insufficient documentation

## 2019-07-03 DIAGNOSIS — F17210 Nicotine dependence, cigarettes, uncomplicated: Secondary | ICD-10-CM | POA: Insufficient documentation

## 2019-07-03 DIAGNOSIS — Z7982 Long term (current) use of aspirin: Secondary | ICD-10-CM | POA: Diagnosis not present

## 2019-07-03 LAB — CBC WITH DIFFERENTIAL/PLATELET
Abs Immature Granulocytes: 0.09 10*3/uL — ABNORMAL HIGH (ref 0.00–0.07)
Basophils Absolute: 0.1 10*3/uL (ref 0.0–0.1)
Basophils Relative: 1 %
Eosinophils Absolute: 0 10*3/uL (ref 0.0–0.5)
Eosinophils Relative: 0 %
HCT: 38.8 % — ABNORMAL LOW (ref 39.0–52.0)
Hemoglobin: 13.4 g/dL (ref 13.0–17.0)
Immature Granulocytes: 1 %
Lymphocytes Relative: 10 %
Lymphs Abs: 1.2 10*3/uL (ref 0.7–4.0)
MCH: 34.9 pg — ABNORMAL HIGH (ref 26.0–34.0)
MCHC: 34.5 g/dL (ref 30.0–36.0)
MCV: 101 fL — ABNORMAL HIGH (ref 80.0–100.0)
Monocytes Absolute: 1.1 10*3/uL — ABNORMAL HIGH (ref 0.1–1.0)
Monocytes Relative: 9 %
Neutro Abs: 10.3 10*3/uL — ABNORMAL HIGH (ref 1.7–7.7)
Neutrophils Relative %: 79 %
Platelets: 175 10*3/uL (ref 150–400)
RBC: 3.84 MIL/uL — ABNORMAL LOW (ref 4.22–5.81)
RDW: 14.4 % (ref 11.5–15.5)
WBC: 12.9 10*3/uL — ABNORMAL HIGH (ref 4.0–10.5)
nRBC: 0 % (ref 0.0–0.2)

## 2019-07-03 LAB — COMPREHENSIVE METABOLIC PANEL
ALT: 67 U/L — ABNORMAL HIGH (ref 0–44)
AST: 159 U/L — ABNORMAL HIGH (ref 15–41)
Albumin: 3.1 g/dL — ABNORMAL LOW (ref 3.5–5.0)
Alkaline Phosphatase: 115 U/L (ref 38–126)
Anion gap: 15 (ref 5–15)
BUN: <5 mg/dL — ABNORMAL LOW (ref 6–20)
CO2: 18 mmol/L — ABNORMAL LOW (ref 22–32)
Calcium: 8.7 mg/dL — ABNORMAL LOW (ref 8.9–10.3)
Chloride: 102 mmol/L (ref 98–111)
Creatinine, Ser: 0.88 mg/dL (ref 0.61–1.24)
GFR calc Af Amer: >60 mL/min (ref >60)
GFR calc non Af Amer: >60 mL/min (ref >60)
Glucose, Bld: 128 mg/dL — ABNORMAL HIGH (ref 70–99)
Potassium: 3.5 mmol/L (ref 3.5–5.1)
Sodium: 135 mmol/L (ref 135–145)
Total Bilirubin: 1.1 mg/dL (ref 0.3–1.2)
Total Protein: 6.8 g/dL (ref 6.5–8.1)

## 2019-07-03 LAB — LIPASE, BLOOD: Lipase: 29 U/L (ref 11–51)

## 2019-07-03 MED ORDER — CHLORDIAZEPOXIDE HCL 25 MG PO CAPS
50.0000 mg | ORAL_CAPSULE | Freq: Once | ORAL | Status: AC
  Administered 2019-07-03: 50 mg via ORAL
  Filled 2019-07-03: qty 2

## 2019-07-03 MED ORDER — SODIUM CHLORIDE 0.9 % IV BOLUS
1000.0000 mL | Freq: Once | INTRAVENOUS | Status: AC
  Administered 2019-07-03: 1000 mL via INTRAVENOUS

## 2019-07-03 MED ORDER — LORAZEPAM 2 MG/ML IJ SOLN
1.0000 mg | Freq: Once | INTRAMUSCULAR | Status: AC
  Administered 2019-07-03: 1 mg via INTRAVENOUS
  Filled 2019-07-03: qty 1

## 2019-07-03 MED ORDER — CHLORDIAZEPOXIDE HCL 25 MG PO CAPS
ORAL_CAPSULE | ORAL | 0 refills | Status: DC
Start: 2019-07-03 — End: 2019-07-17

## 2019-07-03 NOTE — ED Provider Notes (Signed)
[facility] EMERGENCY DEPARTMENT Provider Note   CSN: [id number] Arrival date & time: 07/03/19  1651     History No chief complaint on file.   Greg [name] is a 49 y.o. male.  49 yo M with a chief complaints of a seizure.  This was witnessed by his son.  Apparently he was sitting to have a meal and suddenly had shaking and loss of consciousness.  Patient does not remember much more about the event.  He is an everyday drinker but for some reason did not drink today.  He denies any injury with his loss of consciousness.  Denies headache denies neck pain denies one-sided numbness or weakness denies difficulty with speech or swallowing. He denies any injury with his loss of consciousness. Denies headache denies neck pain denies one-sided numbness or weakness denies difficulty with speech or swallowing. He has been having chronic issues with eating. States that he really does not feel like eating and has had decreased oral intake over the past 6 months to a year. He has seen someone for this in the past and they told him that he had inflammation of the stomach but he is not sure what that means it is not sure he supposed to take any medicines for it. No obvious confusion afterwards no loss of bowel or bladder no biting of the tongue.  The history is provided by the patient.  Illness Severity:  Moderate Onset quality:  Sudden Duration:  2 minutes Timing:  Rare Progression:  Resolved Chronicity:  New Associated symptoms: no abdominal pain, no chest pain, no congestion, no diarrhea, no fever, no headaches, no myalgias, no rash, no shortness of breath and no vomiting        Past Medical History:  Diagnosis Date  . Anxiety   . Asthma   . CAD (coronary artery disease) of bypass graft    s/p LIMA to LAD, SVG to Distal Right, SVG to Circ 11/2017  . COPD (chronic obstructive pulmonary disease) ([place])   . 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 Daily  headache   . GERD (gastroesophageal reflux disease)   . Hypertension   . Myocardial infarction ([place])   . Skin cancer    "cut off my eyelid and my neck" (11/12/2017) [place]    Patient Active Problem List   Diagnosis Date Noted  . Generalized abdominal pain 01/04/2019  . Chronic diarrhea 01/04/2019  . GERD (gastroesophageal reflux disease) 01/04/2019  . 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 Unstable angina pectoris ([place])   . 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 NSTEMI (non-ST elevated myocardial infarction) ([place]) 11/15/2017  . Coronary artery disease 11/15/2017  . Alcohol abuse 11/15/2017  . Hyperlipidemia 11/15/2017  . Cigarette smoker 11/15/2017  . ACS (acute coronary syndrome) ([employer]) 11/12/2017    Past Surgical History:  Procedure Laterality Date  . CORONARY ARTERY BYPASS GRAFT N/A 11/21/2017   Procedure: CORONARY ARTERY BYPASS GRAFTING (CABG) times 3 using left internal mammary artery and right greater saphenous vein harvested endscopically. LIMA to LAD, SVG to Distal Right, SVG to Circ.;  Surgeon: Grace Isaac, MD;  Location: [facility];  Service: Open Heart Surgery;  Laterality: N/A;  . LACERATION REPAIR     "had staples put in my head" (11/12/2017)  . LEFT HEART CATH AND CORONARY ANGIOGRAPHY N/A 11/13/2017   Procedure: LEFT HEART CATH AND CORONARY ANGIOGRAPHY;  Surgeon: Troy Sine, MD;  Location: [facility];  Service: Cardiovascular;  Laterality: N/A;  . SKIN CANCER EXCISION     "cut off my eyelid and my neck" (11/12/2017)  . TEE WITHOUT CARDIOVERSION N/A 11/21/2017   Procedure: TRANSESOPHAGEAL ECHOCARDIOGRAM (TEE);  Surgeon: Grace Isaac, MD;  Location: [facility];  Service: Open Heart Surgery;  Laterality: N/A;       Family History  Problem Relation Age of Onset  . CAD Mother   . CAD Maternal Grandmother   . CAD Maternal Grandfather   . CAD Paternal Grandmother   . CAD Paternal Grandfather   . Colon polyps Neg Hx   . Esophageal cancer Neg Hx   . Rectal cancer Neg Hx     . Stomach cancer Neg Hx     Social History   Tobacco Use  . Smoking status: Current Every Day Smoker    Packs/day: 2.50    Years: 35.00    Pack years: 87.50    Types: Cigarettes  . Smokeless tobacco: Never Used  Substance Use Topics  . Alcohol use: Yes    Alcohol/week: 84.0 standard drinks    Types: 84 Cans of beer per week    Comment: 3-4 beers daily  . Drug use: Yes    Types: Marijuana    Comment: 11/12/2017 "qd"    Home Medications Prior to Admission medications   Medication Sig Start Date End Date Taking? Authorizing Provider  albuterol (VENTOLIN HFA) 108 (90 Base) MCG/ACT inhaler Inhale 2 puffs into the lungs every 6 (six) hours as needed for wheezing or shortness of breath. 07/06/18   Tanda Rockers, MD  ALPRAZolam Duanne Moron) 1 MG tablet Take 1 mg by mouth 2 (two) times daily.    [provider]  aspirin EC 325 MG EC tablet Take 1 tablet (325 mg total) by mouth daily. 11/27/17   Elgie Collard, PA-C  atorvastatin (LIPITOR) 80 MG tablet Take 1 tablet (80 mg total) by mouth daily at 6 PM. 11/26/17   Elgie Collard, PA-C  Glycopyrrolate-Formoterol (BEVESPI AEROSPHERE) 9-4.8 MCG/ACT AERO Inhale 2 puffs into the lungs 2 (two) times daily. 03/08/19   Tanda Rockers, MD  Metoprolol Tartrate 37.5 MG TABS Take 37.5 mg by mouth 2 (two) times daily. 11/26/17   Elgie Collard, PA-C  pantoprazole (PROTONIX) 40 MG tablet Take 1 tablet by mouth once daily 03/14/19   Thornton Park, MD    Allergies    Patient has no known allergies.  Review of Systems   Review of Systems  Constitutional: Negative for chills and fever.  HENT: Negative for congestion and facial swelling.   Eyes: Negative for discharge and visual disturbance.  Respiratory: Negative for shortness of breath.   Cardiovascular: Negative for chest pain and palpitations.  Gastrointestinal: Negative for abdominal pain, diarrhea and vomiting.  Musculoskeletal: Negative for arthralgias and myalgias.  Skin: Negative for  color change and rash.  Neurological: Positive for seizures. Negative for tremors, syncope and headaches.  Psychiatric/Behavioral: Negative for confusion and dysphoric mood.    Physical Exam Updated Vital Signs BP (!) 137/101 (BP Location: Left Arm)   Pulse 95   Temp 98.9 F (37.2 C) (Oral)   Resp 16   SpO2 96%   Physical Exam Vitals and nursing note reviewed.  Constitutional:      Appearance: He is well-developed.  HENT:     Head: Normocephalic and atraumatic.  Eyes:     Pupils: Pupils are equal, round, and reactive to light.  Neck:     Vascular: No  JVD.  Cardiovascular:     Rate and Rhythm: Normal rate and regular rhythm.     Heart sounds: No murmur. No friction rub. No gallop.   Pulmonary:     Effort: No respiratory distress.     Breath sounds: No wheezing.  Abdominal:     General: There is no distension.     Tenderness: There is no abdominal tenderness. There is no guarding or rebound.  Musculoskeletal:        General: Normal range of motion.     Cervical back: Normal range of motion and neck supple.  Skin:    Coloration: Skin is not pale.     Findings: No rash.  Neurological:     Mental Status: He is alert and oriented to person, place, and time.     GCS: GCS eye subscore is 4. GCS verbal subscore is 5. GCS motor subscore is 6.     Cranial Nerves: Cranial nerves are intact.     Sensory: Sensation is intact.     Motor: Motor function is intact.     Coordination: Coordination is intact.     Gait: Gait is intact.     Comments: Slight tremor bilaterally with finger-to-nose. Otherwise benign neurologic exam.  Psychiatric:        Behavior: Behavior normal.     ED Results / Procedures / Treatments   Labs (all labs ordered are listed, but only abnormal results are displayed) Labs Reviewed  CBC WITH DIFFERENTIAL/PLATELET  COMPREHENSIVE METABOLIC PANEL  LIPASE, BLOOD  CBG MONITORING, ED    EKG None  Radiology No results found.  Procedures Procedures  (including critical care time)  Medications Ordered in ED Medications  LORazepam (ATIVAN) injection 1 mg (has no administration in time range)  chlordiazePOXIDE (LIBRIUM) capsule 50 mg (has no administration in time range)  sodium chloride 0.9 % bolus 1,000 mL (has no administration in time range)    ED Course  I have reviewed the triage vital signs and the nursing notes.  Pertinent labs & imaging results that were available during my care of the patient were reviewed by me and considered in my medical decision making (see chart for details).    MDM Rules/Calculators/A&P                      49 yo M with a chief complaints of possible seizure. Back to baseline. Never had a seizure before. CT scan of the head is negative. EKG without concerning finding. Laboratory evaluation. Given Ativan and Librium here for possible alcohol withdrawal.  Work appears unremarkable.  No continued seizure-like activity.  Not tremulous on reassessment.  Discharge home.  10:28 PM:  I have discussed the diagnosis/risks/treatment options with the patient and family and believe the pt to be eligible for discharge home to follow-up with PCP. We also discussed returning to the ED immediately if new or worsening sx occur. We discussed the sx which are most concerning (e.g., sudden worsening pain, fever, inability to tolerate by mouth) that necessitate immediate return. Medications administered to the patient during their visit and any new prescriptions provided to the patient are listed below.  Medications given during this visit Medications  LORazepam (ATIVAN) injection 1 mg (1 mg Intravenous Given 07/03/19 1750)  chlordiazePOXIDE (LIBRIUM) capsule 50 mg (50 mg Oral Given 07/03/19 1757)  sodium chloride 0.9 % bolus 1,000 mL (0 mLs Intravenous Stopped 07/03/19 1949)     The patient appears reasonably screen and/or stabilized for discharge  and I doubt any other medical condition or other [facility] requiring further  screening, evaluation, or treatment in the ED at this time prior to discharge.   Final Clinical Impression(s) / ED Diagnoses Final diagnoses:  None    Rx / DC Orders ED Discharge Orders    None       Deno Etienne, DO 07/03/19 2228

## 2019-07-03 NOTE — ED Triage Notes (Signed)
Pt BIB [facility] from home following witnessed seizure-like activity. Per family, pt began shaking all extremities, no hx of seizures. Pt "used to drink heavily", less recently, none today. Pt family did CPR, pt was responsive, post ictal upon EMS arrival, A&Ox4 after 20 mins. Previous Hx open heart surgery. VS stable, NAD noted.

## 2019-07-03 NOTE — Discharge Instructions (Signed)
You should not do anything that could harm you or someone else if you are to have a seizure.  Please do not drive a car for at least the next 6 months.  Do not climb to tall heights or swim or scuba dive or bathe by yourself.  Follow up with your PCP.

## 2019-07-06 ENCOUNTER — Telehealth: Payer: Self-pay | Admitting: Internal Medicine

## 2019-07-06 NOTE — H&P (View-Only) (Signed)
Cardiology Office Note   Date:  07/09/2019   ID:  Greg [name], DOB 1970-09-03, MRN [id number]  PCP:  Philmore Pali, NP  Cardiologist:  Dr. Harrington Challenger, MD  No chief complaint on file.   History of Present Illness: Greg [name] is a 49 y.o. male who presents for chest pain, seen for Dr. Harrington Challenger.   Greg [name] has a hx of CAD s/p CABG 2019, tobacco abuse, alcohol abuse, and COPD   He was initially seen by her service for a non-STEMI diagnosed with three-vessel CAD with CABG recommendations however patient left AMA and represented one day later for admission.   He was hospitalized 11/12/17-11/14/17 with NSTEMI. He underwent LHC that showed 3V CAD, and CABG was recommended; he was evaluated by CT surgery, who planned for operation in the next several days. He was agitated throughout his hospitalization due to inability to drink alcohol, and he left AMA.   He was called by Dr. Harrington Challenger, who recommended that he return to the hospital. He came back to the ED with ongoing, intermittent chest pain and dyspnea. ECG showed NSR with nonspecific changes similar to prior study.   He ultimately underwent revascularization surgery with LIMA to LAD, SVG to Distal Right, SVG to Circ.  He was then seen 12/04/2017 for hospital follow-up and had not had alcohol since hospital discharge and had cut his tobacco use to 15 cigarettes/day from 2.5 packs/day.  He then called our office on 07/06/2019 with complaints of chest pain which has been ongoing since Saturday.  He is here today with his wife and reports he has had approximately a 3-week history of intermittent anterior chest pain with radiation to his right shoulder blade similar to prior anginal symptoms.  He states chest pain is not necessarily exertional.  Can happen while he is watching TV.  Reports symptoms occurring approximately 2 times per week for the last several weeks.  He has not tried SL NTG.  Did have an episode of "seizure-like activity on Saturday  "for which she was seen in the ED and neuro work-up was negative and he was discharged home.  Reports he had chest pain prior to this event.  He has chronic shortness of breath which is essentially unchanged.  He has had no orthopnea, LE edema, PND, dizziness or syncope. Does report that he had his Covid vaccine on 07/03/2019 and reports symptoms started at that time. Unclear association.  He states he has been compliant with his medications. He stopped smoking last year.   Past Medical History:  Diagnosis Date  . Anxiety   . Asthma   . CAD (coronary artery disease) of bypass graft    s/p LIMA to LAD, SVG to Distal Right, SVG to Circ 11/2017  . COPD (chronic obstructive pulmonary disease) ([place])   . 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 Daily headache   . GERD (gastroesophageal reflux disease)   . Hypertension   . Myocardial infarction ([place])   . Skin cancer    "cut off my eyelid and my neck" (11/12/2017) [place]    Past Surgical History:  Procedure Laterality Date  . CORONARY ARTERY BYPASS GRAFT N/A 11/21/2017   Procedure: CORONARY ARTERY BYPASS GRAFTING (CABG) times 3 using left internal mammary artery and right greater saphenous vein harvested endscopically. LIMA to LAD, SVG to Distal Right, SVG to Circ.;  Surgeon: Grace Isaac, MD;  Location: [facility];  Service: Open Heart Surgery;  Laterality: N/A;  . LACERATION REPAIR     "had staples put in my head" (11/12/2017)  . LEFT HEART CATH AND CORONARY ANGIOGRAPHY N/A 11/13/2017   Procedure: LEFT HEART CATH AND CORONARY ANGIOGRAPHY;  Surgeon: Troy Sine, MD;  Location: [facility];  Service: Cardiovascular;  Laterality: N/A;  . SKIN CANCER EXCISION     "cut off my eyelid and my neck" (11/12/2017)  . TEE WITHOUT CARDIOVERSION N/A 11/21/2017   Procedure: TRANSESOPHAGEAL ECHOCARDIOGRAM (TEE);  Surgeon: Grace Isaac, MD;  Location: [facility];  Service: Open Heart Surgery;  Laterality: N/A;     Current Outpatient  Medications  Medication Sig Dispense Refill  . albuterol (VENTOLIN HFA) 108 (90 Base) MCG/ACT inhaler Inhale 2 puffs into the lungs every 6 (six) hours as needed for wheezing or shortness of breath. 1 Inhaler 0  . ALPRAZolam (XANAX) 1 MG tablet Take 1 mg by mouth 2 (two) times daily.    Marland Kitchen aspirin EC 325 MG EC tablet Take 1 tablet (325 mg total) by mouth daily. 30 tablet 0  . atorvastatin (LIPITOR) 80 MG tablet Take 1 tablet (80 mg total) by mouth daily at 6 PM. 30 tablet 1  . chlordiazePOXIDE (LIBRIUM) 25 MG capsule 73m PO TID x 1D, then 25-515mPO BID X 1D, then 25-5066mO QD X 1D 10 capsule 0  . Glycopyrrolate-Formoterol (BEVESPI AEROSPHERE) 9-4.8 MCG/ACT AERO Inhale 2 puffs into the lungs 2 (two) times daily. 5.9 g 0  . Metoprolol Tartrate 37.5 MG TABS Take 37.5 mg by mouth 2 (two) times daily. 60 tablet 1  . nitroGLYCERIN (NITROSTAT) 0.6 MG SL tablet as needed.    . pantoprazole (PROTONIX) 40 MG tablet Take 1 tablet by mouth once daily 30 tablet 0   No current facility-administered medications for this visit.    Allergies:   Patient has no known allergies.    Social History:  The patient  reports that he has been smoking cigarettes. He has a 87.50 pack-year smoking history. He has never used smokeless tobacco. He reports current alcohol use of about 84.0 standard drinks of alcohol per week. He reports current drug use. Drug: Marijuana.   Family History:  The patient's family history includes CAD in his maternal grandfather, maternal grandmother, mother, paternal grandfather, and paternal grandmother.    ROS:  Please see the history of present illness.   Otherwise, review of systems are positive for none. All other systems are reviewed and negative.    PHYSICAL EXAM: VS:  BP 122/80   Pulse 85   Ht 5' 10"  (1.778 m)   Wt 154 lb (69.9 kg)   SpO2 95%   BMI 22.10 kg/m  , BMI Body mass index is 22.1 kg/m.   General: Well developed, well nourished, NAD Neck: Negative for carotid  bruits. No JVD Lungs:Clear to ausculation bilaterally. No wheezes, rales, or rhonchi. Breathing is unlabored. Cardiovascular: RRR with S1 S2. No murmurs, rubs, gallops, or LV heave appreciated. Extremities: No edema. Radial  pulses 2+ bilaterally Neuro: Alert and oriented. No focal deficits. No facial asymmetry. MAE spontaneously. Psych: Responds to questions appropriately with normal affect.     EKG:  EKG is ordered today. The ekg ordered today demonstrates NSR with no acute changes, similar to prior tracing, HR 85bpm   Recent Labs: 07/03/2019: ALT 67; BUN <5; Creatinine, Ser 0.88; Hemoglobin 13.4; Platelets 175; Potassium 3.5; Sodium 135    Lipid Panel    Component Value Date/Time   CHOL 204 (H)  11/14/2017 2304   TRIG 76 11/14/2017 2304   HDL 75 11/14/2017 2304   CHOLHDL 2.7 11/14/2017 2304   VLDL 15 11/14/2017 2304   LDLCALC 114 (H) 11/14/2017 2304      Wt Readings from Last 3 Encounters:  07/09/19 154 lb (69.9 kg)  01/04/19 157 lb 4 oz (71.3 kg)  11/13/18 153 lb 3.2 oz (69.5 kg)      Other studies Reviewed: Additional studies/ records that were reviewed today include: . Review of the above records demonstrates:   LEFT HEART CATH AND CORONARY ANGIOGRAPHY10/10/19  Conclusion     Dist RCA lesion is 90% stenosed.  Prox RCA lesion is 30% stenosed.  Prox RCA to Mid RCA lesion is 50% stenosed.  Mid RCA lesion is 50% stenosed.  Prox Cx lesion is 75% stenosed.  Mid Cx lesion is 70% stenosed.  Ost LAD lesion is 50% stenosed.  Prox LAD-1 lesion is 50% stenosed.  Prox LAD-2 lesion is 95% stenosed.  Mid LAD lesion is 80% stenosed.  LV end diastolic pressure is normal.  The left ventricular systolic function is normal.  Severe multivessel coronary obstructive disease with diffuse 50% proximal LAD stenoses followed by 95% stenosis before the takeoff of the first diagonal vessel with 80% stenosis beyond the diagonal vessel in the mid LAD; 75 and 70%  proximal left circumflex stenoses; and diffuse irregularity of the proximal and mid RCA with a narrowings up to 50% with focal 90% acute margin stenosis in a dominant RCA. Distal vessels are all very large.  Preserved global LV contractility with an ejection fraction of 55 to 60% but with a small region of mid inferior focal hypocontractility. LVEDP is 15 mmHg.  RECOMMENDATION: In this patient with severe diffuse multivessel coronary obstructive disease and excellent distal targets, recommend surgical consultation for consideration of CABG revascularization. Initiate high potency statin therapy with target LDL less than 80. Smoking cessation is imperative.  Recommend Aspirin 87m daily for moderate CAD.Will not start DAPT in anticipation of CABG revascularization surgery. Will resume heparin 10 hours post procedure.    Echo 11/13/17 Study Conclusions concerned about her due to  - Left ventricle: The cavity size was normal. Wall thickness was normal. Systolic function was normal. The estimated ejection fraction was in the range of 60% to 65%. Wall motion was normal; there were no regional wall motion abnormalities. Doppler parameters are consistent with abnormal left ventricular relaxation (grade 1 diastolic dysfunction). The E/e&' ratio is <8, suggesting normal LV filling pressure. - Left atrium: The atrium was normal in size. - Inferior vena cava: The vessel was normal in size. The respirophasic diameter changes were in the normal range (>= 50%), consistent with normal central venous pressure.  Impressions:  - LVEF 60-65%, normal wall thickness, normal wall motion, grade 1 DD, normal LV filling pressure, normal LA size, normal IVC.   ASSESSMENT AND PLAN:  1.CAD s/p CABG x 3 2019 with angina: -Reports a 3-week history of intermittent anterior chest pain with radiation to his right shoulder blade similar to prior anginal symptoms.  Chest pain symptoms  are nonexertional however concerning for ACS given his past history.  He reports medication compliance.  Discussed LHC in depth and patient is agreeable. -Reduce ASA to 849mPO QD, continue statin, BB  -Cardiac catheterization was discussed with the patient fully. The patient understands that risks include but are not limited to stroke (1 in 1000), death (1 in 1076 kidney failure [usually temporary] (1 in 500), bleeding (1 in  200), allergic reaction [possibly serious] (1 in 200).  The patient understands and is willing to proceed.    2.  HLD -11/14/2017: Cholesterol 204; HDL 75; LDL Cholesterol 114; Triglycerides 76; VLDL 15  -Continue high intensity statin -Recheck lipids at follow up  3. Tobacco abuse -Reports cessation    Current medicines are reviewed at length with the patient today.  The patient does not have concerns regarding medicines.  The following changes have been made:  no change  Labs/ tests ordered today include: LHC   Orders Placed This Encounter  Procedures  . EKG 12-Lead    Disposition:   FU with Dr. Harrington Challenger or APP in 3 weeks  Signed, Kathyrn Drown, NP  07/09/2019 10:45 AM    [address], [place], [place]  [address] Phone: [phone]; Fax: [phone]

## 2019-07-06 NOTE — Telephone Encounter (Signed)
Pt c/o of Chest Pain: STAT if CP now or developed within 24 hours  1. Are you having CP right now? Not right now but comes and goes. States it is chest tightness.   2. Are you experiencing any other symptoms (ex. SOB, nausea, vomiting, sweating)? Pt has COPD so naturally has SOB and sometimes has nausea. Had his first covid shot on 06/30/19 then went to the hospital on 07/03/19. She states that he has a cough that cracks and pops. He now has a knot on his chest and on his leg. I scheduled him for 07/08/19 at 9:45am with Kathyrn Drown.   3. How long have you been experiencing CP? Since Saturday 07/03/19  4. Is your CP continuous or coming and going? Coming and going   5. Have you taken Nitroglycerin? Not recently.  ?

## 2019-07-06 NOTE — Telephone Encounter (Signed)
Left message to call back  

## 2019-07-06 NOTE — Progress Notes (Signed)
Cardiology Office Note   Date:  07/09/2019   ID:  Greg [name], DOB [birth year], MRN [id number]  PCP:  Philmore Pali, NP  Cardiologist:  Dr. Harrington Challenger, MD  No chief complaint on file.   History of Present Illness: Greg [name] is a 49 y.o. male who presents for chest pain, seen for Dr. Harrington Challenger.   Greg [name] has a hx of CAD s/p CABG 2019, tobacco abuse, alcohol abuse, and COPD   He was initially seen by her service for a non-STEMI diagnosed with three-vessel CAD with CABG recommendations however patient left AMA and represented one day later for admission.   He was hospitalized 11/12/17-11/14/17 with NSTEMI. He underwent LHC that showed 3V CAD, and CABG was recommended; he was evaluated by CT surgery, who planned for operation in the next several days. He was agitated throughout his hospitalization due to inability to drink alcohol, and he left AMA.   He was called by Dr. Harrington Challenger, who recommended that he return to the hospital. He came back to the ED with ongoing, intermittent chest pain and dyspnea. ECG showed NSR with nonspecific changes similar to prior study.   He ultimately underwent revascularization surgery with LIMA to LAD, SVG to Distal Right, SVG to Circ.  He was then seen 12/04/2017 for hospital follow-up and had not had alcohol since hospital discharge and had cut his tobacco use to 15 cigarettes/day from 2.5 packs/day.  He then called our office on 07/06/2019 with complaints of chest pain which has been ongoing since Saturday.  He is here today with his wife and reports he has had approximately a 3-week history of intermittent anterior chest pain with radiation to his right shoulder blade similar to prior anginal symptoms.  He states chest pain is not necessarily exertional.  Can happen while he is watching TV.  Reports symptoms occurring approximately 2 times per week for the last several weeks.  He has not tried SL NTG.  Did have an episode of "seizure-like activity on Saturday  "for which she was seen in the ED and neuro work-up was negative and he was discharged home.  Reports he had chest pain prior to this event.  He has chronic shortness of breath which is essentially unchanged.  He has had no orthopnea, LE edema, PND, dizziness or syncope. Does report that he had his Covid vaccine on 07/03/2019 and reports symptoms started at that time. Unclear association.  He states he has been compliant with his medications. He stopped smoking last year.   Past Medical History:  Diagnosis Date  . Anxiety   . Asthma   . CAD (coronary artery disease) of bypass graft    s/p LIMA to LAD, SVG to Distal Right, SVG to Circ 11/2017  . COPD (chronic obstructive pulmonary disease) ([place])   . COPD, severe ([place]) 11/19/2017   fev1 33% predicted   . Daily headache   . GERD (gastroesophageal reflux disease)   . Hypertension   . Myocardial infarction ([place])   . Skin cancer    "cut off my eyelid and my neck" (11/12/2017) [facility]    Past Surgical History:  Procedure Laterality Date  . CORONARY ARTERY BYPASS GRAFT N/A 11/21/2017   Procedure: CORONARY ARTERY BYPASS GRAFTING (CABG) times 3 using left internal mammary artery and right greater saphenous vein harvested endscopically. LIMA to LAD, SVG to Distal Right, SVG to Circ.;  Surgeon: Grace Isaac, MD;  Location: [facility];  Service: Open Heart Surgery;  Laterality: N/A;  . LACERATION REPAIR     "had staples put in my head" (11/12/2017)  . LEFT HEART CATH AND CORONARY ANGIOGRAPHY N/A 11/13/2017   Procedure: LEFT HEART CATH AND CORONARY ANGIOGRAPHY;  Surgeon: Troy Sine, MD;  Location: [facility];  Service: Cardiovascular;  Laterality: N/A;  . SKIN CANCER EXCISION     "cut off my eyelid and my neck" (11/12/2017)  . TEE WITHOUT CARDIOVERSION N/A 11/21/2017   Procedure: TRANSESOPHAGEAL ECHOCARDIOGRAM (TEE);  Surgeon: Grace Isaac, MD;  Location: [facility];  Service: Open Heart Surgery;  Laterality: N/A;     Current Outpatient  Medications  Medication Sig Dispense Refill  . albuterol (VENTOLIN HFA) 108 (90 Base) MCG/ACT inhaler Inhale 2 puffs into the lungs every 6 (six) hours as needed for wheezing or shortness of breath. 1 Inhaler 0  . ALPRAZolam (XANAX) 1 MG tablet Take 1 mg by mouth 2 (two) times daily.    Marland Kitchen aspirin EC 325 MG EC tablet Take 1 tablet (325 mg total) by mouth daily. 30 tablet 0  . atorvastatin (LIPITOR) 80 MG tablet Take 1 tablet (80 mg total) by mouth daily at 6 PM. 30 tablet 1  . chlordiazePOXIDE (LIBRIUM) 25 MG capsule 72m PO TID x 1D, then 25-552mPO BID X 1D, then 25-50102mO QD X 1D 10 capsule 0  . Glycopyrrolate-Formoterol (BEVESPI AEROSPHERE) 9-4.8 MCG/ACT AERO Inhale 2 puffs into the lungs 2 (two) times daily. 5.9 g 0  . Metoprolol Tartrate 37.5 MG TABS Take 37.5 mg by mouth 2 (two) times daily. 60 tablet 1  . nitroGLYCERIN (NITROSTAT) 0.6 MG SL tablet as needed.    . pantoprazole (PROTONIX) 40 MG tablet Take 1 tablet by mouth once daily 30 tablet 0   No current facility-administered medications for this visit.    Allergies:   Patient has no known allergies.    Social History:  The patient  reports that he has been smoking cigarettes. He has a 87.50 pack-year smoking history. He has never used smokeless tobacco. He reports current alcohol use of about 84.0 standard drinks of alcohol per week. He reports current drug use. Drug: Marijuana.   Family History:  The patient's family history includes CAD in his maternal grandfather, maternal grandmother, mother, paternal grandfather, and paternal grandmother.    ROS:  Please see the history of present illness.   Otherwise, review of systems are positive for none. All other systems are reviewed and negative.    PHYSICAL EXAM: VS:  BP 122/80   Pulse 85   Ht 5' 10"  (1.778 m)   Wt 154 lb (69.9 kg)   SpO2 95%   BMI 22.10 kg/m  , BMI Body mass index is 22.1 kg/m.   General: Well developed, well nourished, NAD Neck: Negative for carotid  bruits. No JVD Lungs:Clear to ausculation bilaterally. No wheezes, rales, or rhonchi. Breathing is unlabored. Cardiovascular: RRR with S1 S2. No murmurs, rubs, gallops, or LV heave appreciated. Extremities: No edema. Radial  pulses 2+ bilaterally Neuro: Alert and oriented. No focal deficits. No facial asymmetry. MAE spontaneously. Psych: Responds to questions appropriately with normal affect.     EKG:  EKG is ordered today. The ekg ordered today demonstrates NSR with no acute changes, similar to prior tracing, HR 85bpm   Recent Labs: 07/03/2019: ALT 67; BUN <5; Creatinine, Ser 0.88; Hemoglobin 13.4; Platelets 175; Potassium 3.5; Sodium 135    Lipid Panel    Component Value Date/Time   CHOL 204 (H)  11/14/2017 2304   TRIG 76 11/14/2017 2304   HDL 75 11/14/2017 2304   CHOLHDL 2.7 11/14/2017 2304   VLDL 15 11/14/2017 2304   LDLCALC 114 (H) 11/14/2017 2304      Wt Readings from Last 3 Encounters:  07/09/19 154 lb (69.9 kg)  01/04/19 157 lb 4 oz (71.3 kg)  11/13/18 153 lb 3.2 oz (69.5 kg)      Other studies Reviewed: Additional studies/ records that were reviewed today include: . Review of the above records demonstrates:   LEFT HEART CATH AND CORONARY ANGIOGRAPHY10/10/19  Conclusion     Dist RCA lesion is 90% stenosed.  Prox RCA lesion is 30% stenosed.  Prox RCA to Mid RCA lesion is 50% stenosed.  Mid RCA lesion is 50% stenosed.  Prox Cx lesion is 75% stenosed.  Mid Cx lesion is 70% stenosed.  Ost LAD lesion is 50% stenosed.  Prox LAD-1 lesion is 50% stenosed.  Prox LAD-2 lesion is 95% stenosed.  Mid LAD lesion is 80% stenosed.  LV end diastolic pressure is normal.  The left ventricular systolic function is normal.  Severe multivessel coronary obstructive disease with diffuse 50% proximal LAD stenoses followed by 95% stenosis before the takeoff of the first diagonal vessel with 80% stenosis beyond the diagonal vessel in the mid LAD; 75 and 70%  proximal left circumflex stenoses; and diffuse irregularity of the proximal and mid RCA with a narrowings up to 50% with focal 90% acute margin stenosis in a dominant RCA. Distal vessels are all very large.  Preserved global LV contractility with an ejection fraction of 55 to 60% but with a small region of mid inferior focal hypocontractility. LVEDP is 15 mmHg.  RECOMMENDATION: In this patient with severe diffuse multivessel coronary obstructive disease and excellent distal targets, recommend surgical consultation for consideration of CABG revascularization. Initiate high potency statin therapy with target LDL less than 80. Smoking cessation is imperative.  Recommend Aspirin 2m daily for moderate CAD.Will not start DAPT in anticipation of CABG revascularization surgery. Will resume heparin 10 hours post procedure.    Echo 11/13/17 Study Conclusions concerned about her due to  - Left ventricle: The cavity size was normal. Wall thickness was normal. Systolic function was normal. The estimated ejection fraction was in the range of 60% to 65%. Wall motion was normal; there were no regional wall motion abnormalities. Doppler parameters are consistent with abnormal left ventricular relaxation (grade 1 diastolic dysfunction). The E/e&' ratio is <8, suggesting normal LV filling pressure. - Left atrium: The atrium was normal in size. - Inferior vena cava: The vessel was normal in size. The respirophasic diameter changes were in the normal range (>= 50%), consistent with normal central venous pressure.  Impressions:  - LVEF 60-65%, normal wall thickness, normal wall motion, grade 1 DD, normal LV filling pressure, normal LA size, normal IVC.   ASSESSMENT AND PLAN:  1.CAD s/p CABG x 3 2019 with angina: -Reports a 3-week history of intermittent anterior chest pain with radiation to his right shoulder blade similar to prior anginal symptoms.  Chest pain symptoms  are nonexertional however concerning for ACS given his past history.  He reports medication compliance.  Discussed LHC in depth and patient is agreeable. -Reduce ASA to 872mPO QD, continue statin, BB  -Cardiac catheterization was discussed with the patient fully. The patient understands that risks include but are not limited to stroke (1 in 1000), death (1 in 1041 kidney failure [usually temporary] (1 in 500), bleeding (1 in  200), allergic reaction [possibly serious] (1 in 200).  The patient understands and is willing to proceed.    2.  HLD -11/14/2017: Cholesterol 204; HDL 75; LDL Cholesterol 114; Triglycerides 76; VLDL 15  -Continue high intensity statin -Recheck lipids at follow up  3. Tobacco abuse -Reports cessation    Current medicines are reviewed at length with the patient today.  The patient does not have concerns regarding medicines.  The following changes have been made:  no change  Labs/ tests ordered today include: LHC   Orders Placed This Encounter  Procedures  . EKG 12-Lead    Disposition:   FU with Dr. Harrington Challenger or APP in 3 weeks  Signed, Kathyrn Drown, NP  07/09/2019 10:45 AM    [address], [place], [place]  [address] Phone: [phone]; Fax: [phone]

## 2019-07-06 NOTE — Telephone Encounter (Signed)
I would recomm pt make sure he hydrates Set patient up for tropoinin and WESR

## 2019-07-06 NOTE — Telephone Encounter (Signed)
Called and spoke to patient's wife. She states that the patient has had intermittent episodes of chest tightness that started last week after receiving his covid vaccine. She states that this can happen with activity and at rest. She states that Saturday he had an episode where he felt tight in the center of his chest and started shaking. Denies NTG use. She states that he went to the hospital and was having a seizure. The patient normally drinks everyday and had not had anything to drink that day. She states that he has SOB d/t COPD but has not been any worse with these episodes. She states that he has had a cough that feels like he has a crack and a pop when he coughs. She states that he has a knot on his chest and his leg. Denies recent trauma. BP is 121/81 HR 97. Patient denies having any Sx at this time. Patient already has an appointment with Kathyrn Drown on 6/4. Instructed for the patient to let us know if his Sx change or worsen. ER precautions reviewed. They verbalized understanding and thanked me for the call.

## 2019-07-07 ENCOUNTER — Other Ambulatory Visit: Payer: Self-pay

## 2019-07-07 DIAGNOSIS — Z951 Presence of aortocoronary bypass graft: Secondary | ICD-10-CM

## 2019-07-07 NOTE — Progress Notes (Signed)
Spoke with the patients wife and informed her to have the patient come in early on 6/4 before his 9:45 am appt with JM in order to have his lab work done prior to his appt. She verbalized understanding.

## 2019-07-09 ENCOUNTER — Encounter: Payer: Self-pay | Admitting: Cardiology

## 2019-07-09 ENCOUNTER — Other Ambulatory Visit: Payer: Medicaid Other

## 2019-07-09 ENCOUNTER — Ambulatory Visit (INDEPENDENT_AMBULATORY_CARE_PROVIDER_SITE_OTHER): Payer: Medicaid Other | Admitting: Cardiology

## 2019-07-09 ENCOUNTER — Other Ambulatory Visit: Payer: Self-pay

## 2019-07-09 VITALS — BP 122/80 | HR 85 | Ht 70.0 in | Wt 154.0 lb

## 2019-07-09 DIAGNOSIS — Z951 Presence of aortocoronary bypass graft: Secondary | ICD-10-CM | POA: Diagnosis not present

## 2019-07-09 DIAGNOSIS — R079 Chest pain, unspecified: Secondary | ICD-10-CM | POA: Diagnosis not present

## 2019-07-09 DIAGNOSIS — I200 Unstable angina: Secondary | ICD-10-CM

## 2019-07-09 DIAGNOSIS — E785 Hyperlipidemia, unspecified: Secondary | ICD-10-CM

## 2019-07-09 NOTE — Patient Instructions (Addendum)
Medication Instructions:   Your physician recommends that you continue on your current medications as directed. Please refer to the Current Medication list given to you today.  *If you need a refill on your cardiac medications before your next appointment, please call your pharmacy*  Lab Work:  None ordered today  Testing/Procedures:  Your physician has requested that you have a cardiac catheterization. Cardiac catheterization is used to diagnose and/or treat various heart conditions. Doctors may recommend this procedure for a number of different reasons. The most common reason is to evaluate chest pain. Chest pain can be a symptom of coronary artery disease (CAD), and cardiac catheterization can show whether plaque is narrowing or blocking your heart's arteries. This procedure is also used to evaluate the valves, as well as measure the blood flow and oxygen levels in different parts of your heart. For further information please visit [email or web address]. Please follow instruction sheet, as given.  Your Pre-procedure COVID-19 Testing will be done on  at [address] [place]. On 07/13/19 at 11:40AM. After your swab you will be given a mask to wear and instructed to go home and quarantine you are not allowed to have visitors until after your procedure. If you test positive you will be notified and your procedure will be cancelled.   Follow-Up:  On 08/03/19 at 8:45AM with Richardson Dopp, PA-C

## 2019-07-13 ENCOUNTER — Other Ambulatory Visit (HOSPITAL_COMMUNITY): Payer: Medicaid Other

## 2019-07-13 ENCOUNTER — Ambulatory Visit: Payer: Medicaid Other | Admitting: Cardiology

## 2019-07-15 ENCOUNTER — Telehealth: Payer: Self-pay | Admitting: *Deleted

## 2019-07-15 NOTE — Telephone Encounter (Signed)
Pt contacted pre-catheterization scheduled at [facility] for: Friday July 16, 2019 10:30 AM Verified arrival time and place: [facility]) at: 8:30 AM   No solid food after midnight prior to cath, clear liquids until 5 AM day of procedure.   AM meds can be  taken pre-cath with sips of water including: ASA 81 mg   Confirmed patient has responsible adult to drive home post procedure and observe 24 hours after arriving home: yes  You are allowed ONE visitor in the waiting room during your procedure. Both you and your visitor must wear masks.      COVID-19 Pre-Screening Questions:  . In the past 7 to 10 days have you had a cough,  shortness of breath, headache, congestion, fever (100 or greater) body aches, chills, sore throat, or sudden loss of taste or sense of smell? no . In the past 7 to 10 days have you been around anyone with known Covid 19? no    Reviewed procedure/mask/visitor instructions, COVID-19 screening questions with [name] (DPR).

## 2019-07-16 ENCOUNTER — Ambulatory Visit (HOSPITAL_COMMUNITY)
Admission: RE | Admit: 2019-07-16 | Discharge: 2019-07-17 | Disposition: A | Discharge status: Home / Self Care | Payer: Medicaid Other | Attending: Cardiovascular Disease | Admitting: Cardiovascular Disease

## 2019-07-16 ENCOUNTER — Ambulatory Visit (HOSPITAL_COMMUNITY)
Admission: RE | Disposition: A | Discharge status: Home / Self Care | Payer: Self-pay | Source: Home / Self Care | Attending: Cardiovascular Disease

## 2019-07-16 ENCOUNTER — Other Ambulatory Visit: Payer: Self-pay

## 2019-07-16 DIAGNOSIS — K219 Gastro-esophageal reflux disease without esophagitis: Secondary | ICD-10-CM | POA: Insufficient documentation

## 2019-07-16 DIAGNOSIS — I25118 Atherosclerotic heart disease of native coronary artery with other forms of angina pectoris: Secondary | ICD-10-CM

## 2019-07-16 DIAGNOSIS — J449 Chronic obstructive pulmonary disease, unspecified: Secondary | ICD-10-CM | POA: Diagnosis not present

## 2019-07-16 DIAGNOSIS — Z951 Presence of aortocoronary bypass graft: Secondary | ICD-10-CM | POA: Diagnosis not present

## 2019-07-16 DIAGNOSIS — I252 Old myocardial infarction: Secondary | ICD-10-CM | POA: Diagnosis not present

## 2019-07-16 DIAGNOSIS — Z7982 Long term (current) use of aspirin: Secondary | ICD-10-CM | POA: Insufficient documentation

## 2019-07-16 DIAGNOSIS — Z79899 Other long term (current) drug therapy: Secondary | ICD-10-CM | POA: Insufficient documentation

## 2019-07-16 DIAGNOSIS — I25708 Atherosclerosis of coronary artery bypass graft(s), unspecified, with other forms of angina pectoris: Secondary | ICD-10-CM | POA: Diagnosis not present

## 2019-07-16 DIAGNOSIS — I2510 Atherosclerotic heart disease of native coronary artery without angina pectoris: Secondary | ICD-10-CM | POA: Insufficient documentation

## 2019-07-16 DIAGNOSIS — F419 Anxiety disorder, unspecified: Secondary | ICD-10-CM | POA: Diagnosis not present

## 2019-07-16 DIAGNOSIS — F17210 Nicotine dependence, cigarettes, uncomplicated: Secondary | ICD-10-CM | POA: Diagnosis not present

## 2019-07-16 DIAGNOSIS — I200 Unstable angina: Secondary | ICD-10-CM

## 2019-07-16 DIAGNOSIS — E785 Hyperlipidemia, unspecified: Secondary | ICD-10-CM | POA: Insufficient documentation

## 2019-07-16 DIAGNOSIS — I10 Essential (primary) hypertension: Secondary | ICD-10-CM | POA: Insufficient documentation

## 2019-07-16 DIAGNOSIS — Z955 Presence of coronary angioplasty implant and graft: Secondary | ICD-10-CM | POA: Diagnosis not present

## 2019-07-16 DIAGNOSIS — I25700 Atherosclerosis of coronary artery bypass graft(s), unspecified, with unstable angina pectoris: Secondary | ICD-10-CM | POA: Diagnosis not present

## 2019-07-16 DIAGNOSIS — Z720 Tobacco use: Secondary | ICD-10-CM | POA: Diagnosis present

## 2019-07-16 DIAGNOSIS — F1010 Alcohol abuse, uncomplicated: Secondary | ICD-10-CM | POA: Diagnosis not present

## 2019-07-16 DIAGNOSIS — I25810 Atherosclerosis of coronary artery bypass graft(s) without angina pectoris: Secondary | ICD-10-CM | POA: Diagnosis present

## 2019-07-16 HISTORY — PX: CORONARY STENT INTERVENTION: CATH118234

## 2019-07-16 HISTORY — DX: Hyperlipidemia, unspecified: E78.5

## 2019-07-16 HISTORY — DX: Alcohol abuse, uncomplicated: F10.10

## 2019-07-16 HISTORY — PX: LEFT HEART CATH AND CORS/GRAFTS ANGIOGRAPHY: CATH118250

## 2019-07-16 HISTORY — DX: Tobacco use: Z72.0

## 2019-07-16 LAB — POCT ACTIVATED CLOTTING TIME
Activated Clotting Time: 274 seconds
Activated Clotting Time: 412 seconds
Activated Clotting Time: 164 seconds

## 2019-07-16 SURGERY — LEFT HEART CATH AND CORS/GRAFTS ANGIOGRAPHY
Anesthesia: LOCAL

## 2019-07-16 MED ORDER — SODIUM CHLORIDE 0.9% FLUSH
3.0000 mL | Freq: Two times a day (BID) | INTRAVENOUS | Status: DC
  Administered 2019-07-16 – 2019-07-17 (×2): 3 mL via INTRAVENOUS

## 2019-07-16 MED ORDER — HEPARIN SODIUM (PORCINE) 1000 UNIT/ML IJ SOLN
INTRAMUSCULAR | Status: DC | PRN
  Administered 2019-07-16: 7500 [IU] via INTRAVENOUS
  Administered 2019-07-16: 1000 [IU] via INTRAVENOUS

## 2019-07-16 MED ORDER — MIDAZOLAM HCL 2 MG/2ML IJ SOLN
INTRAMUSCULAR | Status: AC
  Filled 2019-07-16: qty 2

## 2019-07-16 MED ORDER — SODIUM CHLORIDE 0.9 % IV SOLN: 250.0000 mL | INTRAVENOUS | Status: DC | PRN

## 2019-07-16 MED ORDER — SODIUM CHLORIDE 0.9% FLUSH: 3.0000 mL | INTRAVENOUS | Status: DC | PRN

## 2019-07-16 MED ORDER — TICAGRELOR 90 MG PO TABS
90.0000 mg | ORAL_TABLET | Freq: Two times a day (BID) | ORAL | Status: DC
  Administered 2019-07-16 – 2019-07-17 (×2): 90 mg via ORAL
  Filled 2019-07-16 (×2): qty 1

## 2019-07-16 MED ORDER — NITROGLYCERIN 1 MG/10 ML FOR IR/CATH LAB
INTRA_ARTERIAL | Status: DC | PRN
  Administered 2019-07-16 (×2): 200 ug via INTRACORONARY

## 2019-07-16 MED ORDER — SODIUM CHLORIDE 0.9 % WEIGHT BASED INFUSION
3.0000 mL/kg/h | INTRAVENOUS | Status: DC
  Administered 2019-07-16: 3 mL/kg/h via INTRAVENOUS

## 2019-07-16 MED ORDER — LIDOCAINE HCL (PF) 1 % IJ SOLN
INTRAMUSCULAR | Status: DC | PRN
  Administered 2019-07-16: 20 mL via INTRADERMAL

## 2019-07-16 MED ORDER — IOHEXOL 350 MG/ML SOLN
INTRAVENOUS | Status: DC | PRN
  Administered 2019-07-16: 170 mL

## 2019-07-16 MED ORDER — SODIUM CHLORIDE 0.9 % WEIGHT BASED INFUSION: 1.0000 mL/kg/h | INTRAVENOUS | Status: DC

## 2019-07-16 MED ORDER — FENTANYL CITRATE (PF) 100 MCG/2ML IJ SOLN
INTRAMUSCULAR | Status: DC | PRN
  Administered 2019-07-16 (×3): 25 ug via INTRAVENOUS

## 2019-07-16 MED ORDER — ALPRAZOLAM 0.5 MG PO TABS
1.0000 mg | ORAL_TABLET | Freq: Two times a day (BID) | ORAL | Status: DC
  Administered 2019-07-16 – 2019-07-17 (×2): 1 mg via ORAL
  Filled 2019-07-16 (×2): qty 2

## 2019-07-16 MED ORDER — FENTANYL CITRATE (PF) 100 MCG/2ML IJ SOLN
INTRAMUSCULAR | Status: AC
  Filled 2019-07-16: qty 2

## 2019-07-16 MED ORDER — HEPARIN (PORCINE) IN NACL 1000-0.9 UT/500ML-% IV SOLN
INTRAVENOUS | Status: AC
  Filled 2019-07-16: qty 1000

## 2019-07-16 MED ORDER — ATORVASTATIN CALCIUM 80 MG PO TABS
80.0000 mg | ORAL_TABLET | Freq: Every day | ORAL | Status: DC
  Administered 2019-07-17: 80 mg via ORAL
  Filled 2019-07-16: qty 1

## 2019-07-16 MED ORDER — ONDANSETRON HCL 4 MG/2ML IJ SOLN: 4.0000 mg | Freq: Four times a day (QID) | INTRAMUSCULAR | Status: DC | PRN

## 2019-07-16 MED ORDER — LABETALOL HCL 5 MG/ML IV SOLN: 10.0000 mg | INTRAVENOUS | Status: AC | PRN

## 2019-07-16 MED ORDER — NITROGLYCERIN 1 MG/10 ML FOR IR/CATH LAB
INTRA_ARTERIAL | Status: AC
  Filled 2019-07-16: qty 10

## 2019-07-16 MED ORDER — TICAGRELOR 90 MG PO TABS
ORAL_TABLET | ORAL | Status: DC | PRN
  Administered 2019-07-16: 180 mg via ORAL

## 2019-07-16 MED ORDER — ASPIRIN 81 MG PO CHEW
81.0000 mg | CHEWABLE_TABLET | Freq: Every day | ORAL | Status: DC
  Administered 2019-07-17: 81 mg via ORAL
  Filled 2019-07-16: qty 1

## 2019-07-16 MED ORDER — HYDRALAZINE HCL 20 MG/ML IJ SOLN: 10.0000 mg | INTRAMUSCULAR | Status: AC | PRN

## 2019-07-16 MED ORDER — METOPROLOL TARTRATE 25 MG PO TABS
25.0000 mg | ORAL_TABLET | Freq: Two times a day (BID) | ORAL | Status: DC
  Administered 2019-07-16 – 2019-07-17 (×2): 25 mg via ORAL
  Filled 2019-07-16 (×2): qty 1

## 2019-07-16 MED ORDER — HEPARIN (PORCINE) IN NACL 1000-0.9 UT/500ML-% IV SOLN
INTRAVENOUS | Status: DC | PRN
  Administered 2019-07-16 (×2): 500 mL

## 2019-07-16 MED ORDER — NICOTINE 14 MG/24HR TD PT24
14.0000 mg | MEDICATED_PATCH | Freq: Every day | TRANSDERMAL | Status: DC
  Administered 2019-07-16 – 2019-07-17 (×2): 14 mg via TRANSDERMAL
  Filled 2019-07-16 (×2): qty 1

## 2019-07-16 MED ORDER — LABETALOL HCL 5 MG/ML IV SOLN
10.0000 mg | Freq: Once | INTRAVENOUS | Status: AC
  Administered 2019-07-16: 10 mg via INTRAVENOUS
  Filled 2019-07-16: qty 4

## 2019-07-16 MED ORDER — ASPIRIN 81 MG PO CHEW: 81.0000 mg | CHEWABLE_TABLET | ORAL | Status: DC

## 2019-07-16 MED ORDER — LIDOCAINE HCL (PF) 1 % IJ SOLN
INTRAMUSCULAR | Status: AC
  Filled 2019-07-16: qty 30

## 2019-07-16 MED ORDER — ACETAMINOPHEN 325 MG PO TABS
650.0000 mg | ORAL_TABLET | ORAL | Status: DC | PRN
  Administered 2019-07-17: 650 mg via ORAL
  Filled 2019-07-16: qty 2

## 2019-07-16 MED ORDER — SODIUM CHLORIDE 0.9 % IV SOLN: INTRAVENOUS | Status: DC

## 2019-07-16 MED ORDER — MIDAZOLAM HCL 2 MG/2ML IJ SOLN
INTRAMUSCULAR | Status: DC | PRN
  Administered 2019-07-16: 1 mg via INTRAVENOUS
  Administered 2019-07-16: 2 mg via INTRAVENOUS
  Administered 2019-07-16: 1 mg via INTRAVENOUS

## 2019-07-16 SURGICAL SUPPLY — 19 items
BALLN SAPPHIRE 2.0X12 (BALLOONS) ×2
BALLN SAPPHIRE ~~LOC~~ 2.25X12 (BALLOONS) ×1 IMPLANT
BALLOON SAPPHIRE 2.0X12 (BALLOONS) IMPLANT
CATH INFINITI 5 FR IM (CATHETERS) ×1 IMPLANT
CATH INFINITI 5FR MULTPACK ANG (CATHETERS) ×1 IMPLANT
CATH LAUNCHER 6FR JR4 (CATHETERS) ×1 IMPLANT
GUIDEWIRE ANGLED .035X150CM (WIRE) ×1 IMPLANT
KIT ENCORE 26 ADVANTAGE (KITS) ×1 IMPLANT
KIT HEART LEFT (KITS) ×2 IMPLANT
PACK CARDIAC CATHETERIZATION (CUSTOM PROCEDURE TRAY) ×2 IMPLANT
SHEATH PINNACLE 5F 10CM (SHEATH) ×1 IMPLANT
SHEATH PINNACLE 6F 10CM (SHEATH) ×1 IMPLANT
SHEATH PROBE COVER 6X72 (BAG) ×1 IMPLANT
STENT RESOLUTE ONYX 2.0X15 (Permanent Stent) ×1 IMPLANT
TRANSDUCER W/STOPCOCK (MISCELLANEOUS) ×2 IMPLANT
TUBING CIL FLEX 10 FLL-RA (TUBING) ×2 IMPLANT
WIRE COUGAR XT STRL 190CM (WIRE) ×1 IMPLANT
WIRE EMERALD 3MM-J .035X150CM (WIRE) ×1 IMPLANT
WIRE EMERALD 3MM-J .035X260CM (WIRE) ×1 IMPLANT

## 2019-07-16 NOTE — Interval H&P Note (Signed)
Cath Lab Visit (complete for each Cath Lab visit)  Clinical Evaluation Leading to the Procedure:   ACS: No.  Non-ACS:    Anginal Classification: CCS III  Anti-ischemic medical therapy: Minimal Therapy (1 class of medications)  Non-Invasive Test Results: No non-invasive testing performed  Prior CABG: Previous CABG      History and Physical Interval Note:  07/16/2019 11:31 AM  Greg [name]  has presented today for surgery, with the diagnosis of chest pain.  The various methods of treatment have been discussed with the patient and family. After consideration of risks, benefits and other options for treatment, the patient has consented to  Procedure(s): LEFT HEART CATH AND CORS/GRAFTS ANGIOGRAPHY (N/A) as a surgical intervention.  The patient's history has been reviewed, patient examined, no change in status, stable for surgery.  I have reviewed the patient's chart and labs.  Questions were answered to the patient's satisfaction.     Shelva Majestic

## 2019-07-16 NOTE — Progress Notes (Signed)
Site area: rt groin fa sheath Site Prior to Removal:  Level 0 Pressure Applied For: 25 minutes Manual:   yes Patient Status During Pull:  stable Post Pull Site:  Level 0 Post Pull Instructions Given:  yes Post Pull Pulses Present: rt dp palpable Dressing Applied:  Gauze and tegaderm Bedrest begins @ 0981 Comments:

## 2019-07-17 ENCOUNTER — Encounter (HOSPITAL_COMMUNITY): Payer: Self-pay | Admitting: Cardiovascular Disease

## 2019-07-17 DIAGNOSIS — I25118 Atherosclerotic heart disease of native coronary artery with other forms of angina pectoris: Secondary | ICD-10-CM | POA: Diagnosis not present

## 2019-07-17 DIAGNOSIS — I10 Essential (primary) hypertension: Secondary | ICD-10-CM | POA: Diagnosis present

## 2019-07-17 DIAGNOSIS — I25700 Atherosclerosis of coronary artery bypass graft(s), unspecified, with unstable angina pectoris: Secondary | ICD-10-CM | POA: Diagnosis not present

## 2019-07-17 DIAGNOSIS — Z720 Tobacco use: Secondary | ICD-10-CM | POA: Diagnosis present

## 2019-07-17 DIAGNOSIS — J449 Chronic obstructive pulmonary disease, unspecified: Secondary | ICD-10-CM | POA: Diagnosis present

## 2019-07-17 DIAGNOSIS — E785 Hyperlipidemia, unspecified: Secondary | ICD-10-CM | POA: Diagnosis present

## 2019-07-17 DIAGNOSIS — I25708 Atherosclerosis of coronary artery bypass graft(s), unspecified, with other forms of angina pectoris: Secondary | ICD-10-CM | POA: Diagnosis not present

## 2019-07-17 DIAGNOSIS — I25810 Atherosclerosis of coronary artery bypass graft(s) without angina pectoris: Secondary | ICD-10-CM | POA: Diagnosis present

## 2019-07-17 DIAGNOSIS — F1010 Alcohol abuse, uncomplicated: Secondary | ICD-10-CM | POA: Insufficient documentation

## 2019-07-17 DIAGNOSIS — Z955 Presence of coronary angioplasty implant and graft: Secondary | ICD-10-CM | POA: Diagnosis not present

## 2019-07-17 LAB — CBC
HCT: 35.7 % — ABNORMAL LOW (ref 39.0–52.0)
Hemoglobin: 12.2 g/dL — ABNORMAL LOW (ref 13.0–17.0)
MCH: 34.5 pg — ABNORMAL HIGH (ref 26.0–34.0)
MCHC: 34.2 g/dL (ref 30.0–36.0)
MCV: 100.8 fL — ABNORMAL HIGH (ref 80.0–100.0)
Platelets: 266 10*3/uL (ref 150–400)
RBC: 3.54 MIL/uL — ABNORMAL LOW (ref 4.22–5.81)
RDW: 14.7 % (ref 11.5–15.5)
WBC: 8.8 10*3/uL (ref 4.0–10.5)
nRBC: 0 % (ref 0.0–0.2)

## 2019-07-17 LAB — BASIC METABOLIC PANEL
Anion gap: 9 (ref 5–15)
BUN: 5 mg/dL — ABNORMAL LOW (ref 6–20)
CO2: 25 mmol/L (ref 22–32)
Calcium: 8.1 mg/dL — ABNORMAL LOW (ref 8.9–10.3)
Chloride: 106 mmol/L (ref 98–111)
Creatinine, Ser: 0.87 mg/dL (ref 0.61–1.24)
GFR calc Af Amer: >60 mL/min (ref >60)
GFR calc non Af Amer: >60 mL/min (ref >60)
Glucose, Bld: 99 mg/dL (ref 70–99)
Potassium: 3.6 mmol/L (ref 3.5–5.1)
Sodium: 140 mmol/L (ref 135–145)

## 2019-07-17 MED ORDER — METOPROLOL TARTRATE 25 MG PO TABS
37.5000 mg | ORAL_TABLET | Freq: Two times a day (BID) | ORAL | Status: DC
  Administered 2019-07-17: 37.5 mg via ORAL
  Filled 2019-07-17: qty 1

## 2019-07-17 MED ORDER — AMLODIPINE BESYLATE 2.5 MG PO TABS: 2.5000 mg | ORAL_TABLET | Freq: Every day | ORAL | 6 refills | Status: DC

## 2019-07-17 MED ORDER — TICAGRELOR 90 MG PO TABS: 90.0000 mg | ORAL_TABLET | Freq: Two times a day (BID) | ORAL | 6 refills | Status: DC

## 2019-07-17 MED ORDER — METOPROLOL TARTRATE 25 MG PO TABS: 37.5000 mg | ORAL_TABLET | Freq: Two times a day (BID) | ORAL | 3 refills | Status: DC

## 2019-07-17 MED ORDER — NICOTINE 14 MG/24HR TD PT24: 14.0000 mg | MEDICATED_PATCH | Freq: Every day | TRANSDERMAL | 0 refills | Status: DC

## 2019-07-17 MED ORDER — AMLODIPINE BESYLATE 2.5 MG PO TABS
2.5000 mg | ORAL_TABLET | Freq: Every day | ORAL | Status: DC
  Administered 2019-07-17: 2.5 mg via ORAL
  Filled 2019-07-17: qty 1

## 2019-07-17 MED ORDER — ASPIRIN EC 81 MG PO TBEC: 81.0000 mg | DELAYED_RELEASE_TABLET | Freq: Every day | ORAL | 6 refills | Status: DC

## 2019-07-17 NOTE — Discharge Instructions (Signed)
**PLEASE REMEMBER TO BRING ALL OF YOUR MEDICATIONS TO EACH OF YOUR FOLLOW-UP OFFICE VISITS.  ° °NO HEAVY LIFTING OR SEXUAL ACTIVITY X 7 DAYS. °NO DRIVING X 3-5 DAYS. °NO SOAKING BATHS, HOT TUBS, POOLS, ETC., X 7 DAYS. ° °Groin Site Care °Refer to this sheet in the next few weeks. These instructions provide you with information on caring for yourself after your procedure. Your caregiver may also give you more specific instructions. Your treatment has been planned according to current medical practices, but problems sometimes occur. Call your caregiver if you have any problems or questions after your procedure. °HOME CARE INSTRUCTIONS °· You may shower 24 hours after the procedure. Remove the bandage (dressing) and gently wash the site with plain soap and water. Gently pat the site dry.  °· Do not apply powder or lotion to the site.  °· Do not sit in a bathtub, swimming pool, or whirlpool for 5 to 7 days.  °· No bending, squatting, or lifting anything over 10 pounds (4.5 kg) as directed by your caregiver.  °· Inspect the site at least twice daily.  °· Do not drive home if you are discharged the same day of the procedure. Have someone else drive you.  ° °What to expect: °· Any bruising will usually fade within 1 to 2 weeks.  °· Blood that collects in the tissue (hematoma) may be painful to the touch. It should usually decrease in size and tenderness within 1 to 2 weeks.  °SEEK IMMEDIATE MEDICAL CARE IF: °· You have unusual pain at the groin site or down the affected leg.  °· You have redness, warmth, swelling, or pain at the groin site.  °· You have drainage (other than a small amount of blood on the dressing).  °· You have chills.  °· You have a fever or persistent symptoms for more than 72 hours.  °· You have a fever and your symptoms suddenly get worse.  °· Your leg becomes pale, cool, tingly, or numb.  °You have heavy bleeding from the site. Hold pressure on the site. . °_____________ ° °  ° °10 Habits of Highly  Healthy People ° °[place] wants to help you get well and stay well.  Live a longer, healthier life by practicing healthy habits every day. ° °1.  Visit your primary care provider regularly. °2.  Make time for family and friends.  Healthy relationships are important. °3.  Take medications as directed by your provider. °4.  Maintain a healthy weight and a trim waistline. °5.  Eat healthy meals and snacks, rich in fruits, vegetables, whole grains, and lean proteins. °6.  Get moving every day - aim for 150 minutes of moderate physical activity each week. °7.  Don't smoke. °8.  Avoid alcohol or drink in moderation. °9.  Manage stress through meditation or mindful relaxation. °10.  Get seven to nine hours of quality sleep each night. ° °Want more information on healthy habits?  To learn more about these and other healthy habits, visit [email or web address]. °_____________ °  °   ° °You have received care from [facility] during this hospital stay and we look forward to continuing to provide you with excellent care in our office settings after you've left the hospital.  In order to assure a smoother transition to home following your discharge from the hospital, we will likely have you see one of our nurse practitioners or physician assistants within a few weeks of discharge.  Our advanced   practice providers work closely with your physician in order to address all of your heart's needs in a timely manner.  More information about all of our providers may be found here: [email or web address] ° °Please plan to bring all of your prescriptions to your follow-up appointment and don't hesitate to contact us with questions or concerns. ° °[facility] - [phone] °[facility] - [phone] °[facility] [address] - [phone] °[facility] - [phone] °[facility] - [phone] °[facility] - [phone] °[facility] - [phone] °[facility] - [phone] °[facility] - [phone]  °

## 2019-07-17 NOTE — Discharge Summary (Signed)
Discharge Summary    Patient ID: Greg [name] MRN: [id number]; DOB: 09/11/1970  Admit date: 07/16/2019 Discharge date: 07/17/2019  Primary Care Provider: Philmore Pali, NP  Primary Cardiologist: Dorris Carnes, MD  Primary Electrophysiologist:  None   Discharge Diagnoses    Principal Problem:   Unstable angina [facility])  **S/p PCI and drug-eluting stent placement to the right coronary artery this admission. Active Problems:   CAD (coronary artery disease) of bypass graft   Hypertension   Hyperlipidemia LDL goal <70   Tobacco abuse   Alcohol abuse   GERD (gastroesophageal reflux disease)   COPD (chronic obstructive pulmonary disease) (HCC)  Diagnostic Studies/Procedures    Cardiac Catheterization and Percutaneous Coronary Intervention 6.11.2021  Left Anterior Descending  Ost LAD lesion is 50% stenosed.  Prox LAD lesion is 90% stenosed.  Mid LAD lesion is 80% stenosed.  Left Circumflex  Prox Cx to Mid Cx lesion is 90% stenosed.  Right Coronary Artery  Prox RCA lesion is 30% stenosed.  Prox RCA to Mid RCA lesion is 40% stenosed.  Mid RCA lesion is 40% stenosed.  Dist RCA lesion is 80% stenosed.      **The RCA was successfully treated with a 2.0 x 15 mm resolute drug-eluting stent.  LIMA Graft To Mid LAD patent  Graft To Ost 3rd Mrg  Dist Graft to Insertion lesion is 40% stenosed.  Graft To Ost RPDA  Origin lesion is 100% stenosed.   Left Ventricle The left ventricular size is normal. The left ventricular systolic function is normal. LV end diastolic pressure is moderately elevated. No regional wall motion abnormalities. There is preserved global LV contractility with EF estimated 60%.  LVEDP is 24 mmHg.  _____________   History of Present Illness     Greg [name] is a 49 y.o. male with a history of coronary artery disease status post three-vessel bypass in 2018, hypertension, hyperlipidemia, ongoing tobacco abuse, remote alcohol abuse, GERD, and COPD.  He recently began  to experience intermittent chest discomfort with radiation to his right shoulder occurring with rest and exertion.  He identified his symptoms as being similar to prior angina.  As result, he contacted our office and was seen in clinic on June 4, at which time he was scheduled for diagnostic catheterization.  Hospital Course     Consultants: None  Patient presented to the [facility] cardiac catheterization laboratory on July 16, 2019 and underwent diagnostic catheterization revealing 2 of 3 patent grafts (patent LIMA to the LAD and patent vein graft to the OM3), and an occluded vein graft to the PDA.  He has native multivessel disease including 90/80% proximal mid LAD stenosis, 90% proximal/mid circumflex stenosis, and an 80% lesion in the distal RCA.  Given new occlusion of the vein graft to the PDA, the native distal RCA lesion was successfully treated with a 2.0 x 15 mm resolute drug-eluting stent.  EF was 60%.  Left ventricular end-diastolic pressure was elevated 24 mmHg however, he is felt to be euvolemic on examination this morning.  He has had no chest pain or dyspnea postprocedure however, his blood pressure is elevated this morning.  We have added amlodipine 2.5 mg daily to his medication regimen which also includes aspirin, high potency statin, beta-blocker, and Brilinta.  He will require dual antiplatelet therapy for at least 6 months.   Did the patient have an acute coronary syndrome (MI, NSTEMI, STEMI, etc) this admission?:  No  Did the patient have a percutaneous coronary intervention (stent / angioplasty)?:  Yes.     Cath/PCI Registry Performance & Quality Measures: 1. Aspirin prescribed? - Yes 2. ADP Receptor Inhibitor (Plavix/Clopidogrel, Brilinta/Ticagrelor or Effient/Prasugrel) prescribed (includes medically managed patients)? - Yes 3. High Intensity Statin (Lipitor 40-17m or Crestor 20-426m prescribed? - Yes 4. For EF <40%, was ACEI/ARB prescribed? -  Not Applicable (EF >/= 4053%5. For EF <40%, Aldosterone Antagonist (Spironolactone or Eplerenone) prescribed? - Not Applicable (EF >/= 4066%6. Cardiac Rehab Phase II ordered? - Yes   _____________  Discharge Vitals Blood pressure (!) 140/95, pulse 77, temperature 98.1 F (36.7 C), temperature source Oral, resp. rate 18, height 5' 10"  (1.778 m), weight 69.9 kg, SpO2 94 %.  Filed Weights   07/16/19 0856  Weight: 69.9 kg    Labs & Radiologic Studies    CBC Recent Labs    07/17/19 0441  WBC 8.8  HGB 12.2*  HCT 35.7*  MCV 100.8*  PLT 26440 Basic Metabolic Panel Recent Labs    07/17/19 0441  NA 140  K 3.6  CL 106  CO2 25  GLUCOSE 99  BUN 5*  CREATININE 0.87  CALCIUM 8.1*  _____________   Disposition   Pt is being discharged home today in good condition.  Follow-up Plans & Appointments     Follow-up Information    WeLiliane ShiPA-C Follow up on 08/03/2019.   Specialties: Cardiology, Physician Assistant Why: 8:45 AM - Dr. RoHarrington ChallengerPA Contact information: [id number]. [phone]            Discharge Instructions    Amb Referral to Cardiac Rehabilitation   Complete by: As directed    Diagnosis: Coronary Stents   After initial evaluation and assessments completed: Virtual Based Care may be provided alone or in conjunction with Phase 2 Cardiac Rehab based on patient barriers.: Yes   Call MD for:  difficulty breathing, headache or visual disturbances   Complete by: As directed    Call MD for:  redness, tenderness, or signs of infection (pain, swelling, redness, odor or green/yellow discharge around incision site)   Complete by: As directed    Call MD for:  severe uncontrolled pain   Complete by: As directed    Diet - low sodium heart healthy   Complete by: As directed    Increase activity slowly   Complete by: As directed       Discharge Medications   Allergies as of 07/17/2019   No Known Allergies       Medication List    STOP taking these medications   chlordiazePOXIDE 25 MG capsule Commonly known as: LIBRIUM     TAKE these medications   albuterol 108 (90 Base) MCG/ACT inhaler Commonly known as: VENTOLIN HFA Inhale 2 puffs into the lungs every 6 (six) hours as needed for wheezing or shortness of breath.   ALPRAZolam 1 MG tablet Commonly known as: XANAX Take 1 mg by mouth 2 (two) times daily.   amLODipine 2.5 MG tablet Commonly known as: NORVASC Take 1 tablet (2.5 mg total) by mouth daily.   aspirin EC 81 MG tablet Take 1 tablet (81 mg total) by mouth daily. What changed:   medication strength  how much to take   atorvastatin 80 MG tablet Commonly known as: LIPITOR Take 1 tablet (80 mg total) by mouth daily at 6 PM.   Bevespi Aerosphere 9-4.8 MCG/ACT Aero Generic  drug: Glycopyrrolate-Formoterol Inhale 2 puffs into the lungs 2 (two) times daily.   metoprolol tartrate 25 MG tablet Commonly known as: LOPRESSOR Take 1.5 tablets (37.5 mg total) by mouth 2 (two) times daily. What changed: Another medication with the same name was removed. Continue taking this medication, and follow the directions you see here.   nicotine 14 mg/24hr patch Commonly known as: NICODERM CQ - dosed in mg/24 hours Place 1 patch (14 mg total) onto the skin daily.   nitroGLYCERIN 0.4 MG SL tablet Commonly known as: NITROSTAT Place 0.4 mg under the tongue every 5 (five) minutes as needed for chest pain.   pantoprazole 40 MG tablet Commonly known as: PROTONIX Take 1 tablet by mouth once daily   ticagrelor 90 MG Tabs tablet Commonly known as: BRILINTA Take 1 tablet (90 mg total) by mouth 2 (two) times daily.         Outstanding Labs/Studies   None  Duration of Discharge Encounter   Greater than 30 minutes including physician time.  Signed, Murray Hodgkins, NP 07/17/2019, 1:03 PM

## 2019-07-17 NOTE — Progress Notes (Signed)
CARDIAC REHAB PHASE I   PRE:  Rate/Rhythm: 77 SR  BP:  Supine: 138/97  Sitting:   Standing:    SaO2: 97% RA  MODE:  Ambulation: 440 ft   POST:  Rate/Rhythm: 94 SR  BP:  Supine:   Sitting: 178/107  Standing:    SaO2: 97% RA Tolerated ambulation well without angina or difficulty.  Concentrated education on smoking cessation, patient is in nicoitene replacement patches while in hospital, feels it has helped, cravings are less, but present.  States he " will give it a try" re: smoking cessation, discussed heart healthy nutrition tips and feels his diet is healthy at this time.  Given exercise guidelines for home, referred to phase II cardiac rehab in [place], patient states he will not attend if called.  Blood pressure elevated before and after ambulation, Dr. Harrington Challenger witnessed readings, given additional hypertension medications.   Liliane Channel RN, BSN 07/17/2019 10:15 AM

## 2019-07-17 NOTE — Progress Notes (Signed)
Progress Note  Patient Name: Greg [name] Date of Encounter: 07/17/2019  [facility] Cardiologist: Dorris Carnes, MD   Subjective   No CP or SOB   Back hurts   Says that is why his BP is up    Inpatient Medications    Scheduled Meds: . ALPRAZolam  1 mg Oral BID  . aspirin  81 mg Oral Daily  . atorvastatin  80 mg Oral Daily  . metoprolol tartrate  25 mg Oral BID  . nicotine  14 mg Transdermal Daily  . sodium chloride flush  3 mL Intravenous Q12H  . sodium chloride flush  3 mL Intravenous Q12H  . ticagrelor  90 mg Oral BID   Continuous Infusions: . sodium chloride 100 mL/hr at 07/16/19 1718  . sodium chloride     PRN Meds: sodium chloride, acetaminophen, ondansetron (ZOFRAN) IV, sodium chloride flush   Vital Signs    Vitals:   07/16/19 2043 07/17/19 0057 07/17/19 0551 07/17/19 0624  BP: 128/88 (!) 149/93 (!) 166/99 (!) 156/97  Pulse: 90 96 89 92  Resp: 14 13 (!) 21 (!) 24  Temp: 99.8 F (37.7 C) 99.5 F (37.5 C)    TempSrc: Oral Oral    SpO2: 96% 98% 96% 97%  Weight:      Height:        Intake/Output Summary (Last 24 hours) at 07/17/2019 0927 Last data filed at 07/17/2019 0548 Gross per 24 hour  Intake 628.2 ml  Output 1900 ml  Net -1271.8 ml   Last 3 Weights 07/16/2019 07/09/2019 01/04/2019  Weight (lbs) 154 lb 154 lb 157 lb 4 oz  Weight (kg) 69.854 kg 69.854 kg 71.328 kg      Telemetry    SR  - Personally Reviewed  ECG    None today  - Personally Reviewed  Physical Exam   GEN: Thin 51  Yo in No acute distress.   Neck: No JVD Cardiac: RRR, no murmurs  Respiratory: Clear to auscultation bilaterally. GI: Soft, nontender, non-distended  MS: No edema; No deformity. Neuro:  Nonfocal  Psych: Normal affect   Labs    High Sensitivity Troponin:  No results for input(s): TROPONINIHS in the last 720 hours.    Chemistry Recent Labs  Lab 07/17/19 0441  NA 140  K 3.6  CL 106  CO2 25  GLUCOSE 99  BUN 5*  CREATININE 0.87  CALCIUM 8.1*    GFRNONAA >60  GFRAA >60  ANIONGAP 9     Hematology Recent Labs  Lab 07/17/19 0441  WBC 8.8  RBC 3.54*  HGB 12.2*  HCT 35.7*  MCV 100.8*  MCH 34.5*  MCHC 34.2  RDW 14.7  PLT 266    BNPNo results for input(s): BNP, PROBNP in the last 168 hours.   DDimer No results for input(s): DDIMER in the last 168 hours.   Radiology    CARDIAC CATHETERIZATION  Result Date: 07/16/2019  Ost LAD lesion is 50% stenosed.  Mid LAD lesion is 80% stenosed.  Dist RCA lesion is 80% stenosed.  Prox RCA lesion is 30% stenosed.  Prox RCA to Mid RCA lesion is 40% stenosed.  Mid RCA lesion is 40% stenosed.  Prox Cx to Mid Cx lesion is 90% stenosed.  Origin lesion is 100% stenosed.  Post intervention, there is a 0% residual stenosis.  Prox LAD lesion is 90% stenosed.  The left ventricular systolic function is normal.  LV end diastolic pressure is moderately elevated.  Dist Graft to  Insertion lesion is 40% stenosed.  A stent was successfully placed.  Severe multivessel native coronary obstructive disease with 50% proximal 90% proximal and 80% stenosis after the diagonal vessel with competitive filling via the LIMA graft; 90% proximal circumflex stenosis with competitive filling due to the vein graft; and RCA with 30%, 40% and 40% proximal to mid stenosis with 80% mid distal stenosis prior to the PDA and PLA takeoff for competitive filling. Patent LIMA graft supplying the mid LAD. Patent SVG supplying the circumflex marginal vessel but with evidence for 40% distal graft stenosis. Occluded vein graft which had supplied the distal RCA. Function with EF estimated 60%; LVEDP elevated at 24 mm. Successful PCI to the RCA with ultimate insertion of a 2.0 x 15 mm Resolute Onyx stent postdilated to 2.20 mm with the 80% stenosis being reduced to 0%. RECOMMENDATION: DAPT for minimum of 6 months but probably longer.  Smoking cessation is essential.  Medical therapy for concomitant CAD.  Aggressive lipid-lowering  therapy with target LDL less than 70.    Cardiac Studies   [facility] 07/16/19  Ost LAD lesion is 50% stenosed.  Mid LAD lesion is 80% stenosed.  Dist RCA lesion is 80% stenosed.  Prox RCA lesion is 30% stenosed.  Prox RCA to Mid RCA lesion is 40% stenosed.  Mid RCA lesion is 40% stenosed.  Prox Cx to Mid Cx lesion is 90% stenosed.  Origin lesion is 100% stenosed.  Post intervention, there is a 0% residual stenosis.  Prox LAD lesion is 90% stenosed.  The left ventricular systolic function is normal.  LV end diastolic pressure is moderately elevated.  Dist Graft to Insertion lesion is 40% stenosed.  A stent was successfully placed.   Severe multivessel native coronary obstructive disease with 50% proximal 90% proximal and 80% stenosis after the diagonal vessel with competitive filling via the LIMA graft; 90% proximal circumflex stenosis with competitive filling due to the vein graft; and RCA with 30%, 40% and 40% proximal to mid stenosis with 80% mid distal stenosis prior to the PDA and PLA takeoff for competitive filling.  Patent LIMA graft supplying the mid LAD.  Patent SVG supplying the circumflex marginal vessel but with evidence for 40% distal graft stenosis.  Occluded vein graft which had supplied the distal RCA.  Function with EF estimated 60%; LVEDP elevated at 24 mm.  Successful PCI to the RCA with ultimate insertion of a 2.0 x 15 mm Resolute Onyx stent postdilated to 2.20 mm with the 80% stenosis being reduced to 0%.  RECOMMENDATION: DAPT for minimum of 6 months but probably longer.  Smoking cessation is essential.  Medical therapy for concomitant CAD.  Aggressive lipid-lowering therapy with target LDL less than 70.    Patient Profile     49 y.o. male hx of CAD ( Hx NSTEMI and s/p CABG in 2019) , Etoh abuse, tob abuse, COPD   Presents with angina for L heart cath   Assessment & Plan    1  CAD  Pt had left heart cath yesterday with results as noted  above   S/p PTCA/DES  to RCA   DAPT for at least 6 months    LVEDP was elevated at cath but pt appears euvolemic now    Urinated a lot last night   Follow     2  HL   80 mg lipitor   F/U as outpt   3  HTN   BP very high today    Was 137/88  prior to walk but now close to 170/100   Metoprolol given 25 bid  WIll increase to home dose and add low dose amlodipne for now   2.5 mg   Get out of bed for back  4  Tob   COunselled on cessation    Probable d/c later today  For questions or updates, please contact [facility] Please consult [email or web address] for contact info under        Signed, Dorris Carnes, MD  07/17/2019, 9:27 AM

## 2019-07-19 ENCOUNTER — Encounter (HOSPITAL_COMMUNITY): Payer: Self-pay | Admitting: Cardiovascular Disease

## 2019-07-19 MED FILL — Fentanyl Citrate Preservative Free (PF) Inj 100 MCG/2ML: INTRAMUSCULAR | Qty: 2 | Status: AC

## 2019-08-03 ENCOUNTER — Ambulatory Visit: Payer: Medicaid Other | Admitting: Physician Assistant

## 2019-08-06 ENCOUNTER — Ambulatory Visit: Payer: Medicaid Other | Admitting: Physician Assistant

## 2019-08-18 ENCOUNTER — Other Ambulatory Visit: Payer: Self-pay

## 2019-08-18 ENCOUNTER — Emergency Department (HOSPITAL_COMMUNITY): Payer: Medicaid Other

## 2019-08-18 ENCOUNTER — Encounter (HOSPITAL_COMMUNITY): Payer: Self-pay | Admitting: Emergency Medicine

## 2019-08-18 ENCOUNTER — Emergency Department (HOSPITAL_COMMUNITY)
Admission: EM | Admit: 2019-08-18 | Discharge: 2019-08-18 | Disposition: A | Discharge status: Home / Self Care | Payer: Medicaid Other | Attending: Emergency Medicine | Admitting: Emergency Medicine

## 2019-08-18 DIAGNOSIS — R569 Unspecified convulsions: Secondary | ICD-10-CM

## 2019-08-18 DIAGNOSIS — I10 Essential (primary) hypertension: Secondary | ICD-10-CM | POA: Insufficient documentation

## 2019-08-18 DIAGNOSIS — F1010 Alcohol abuse, uncomplicated: Secondary | ICD-10-CM | POA: Diagnosis not present

## 2019-08-18 DIAGNOSIS — Z7982 Long term (current) use of aspirin: Secondary | ICD-10-CM | POA: Insufficient documentation

## 2019-08-18 DIAGNOSIS — F17210 Nicotine dependence, cigarettes, uncomplicated: Secondary | ICD-10-CM | POA: Insufficient documentation

## 2019-08-18 DIAGNOSIS — Z79899 Other long term (current) drug therapy: Secondary | ICD-10-CM | POA: Diagnosis not present

## 2019-08-18 DIAGNOSIS — I2510 Atherosclerotic heart disease of native coronary artery without angina pectoris: Secondary | ICD-10-CM | POA: Diagnosis not present

## 2019-08-18 DIAGNOSIS — Z951 Presence of aortocoronary bypass graft: Secondary | ICD-10-CM | POA: Insufficient documentation

## 2019-08-18 DIAGNOSIS — E876 Hypokalemia: Secondary | ICD-10-CM | POA: Diagnosis not present

## 2019-08-18 DIAGNOSIS — J449 Chronic obstructive pulmonary disease, unspecified: Secondary | ICD-10-CM | POA: Insufficient documentation

## 2019-08-18 LAB — BASIC METABOLIC PANEL
Anion gap: 14 (ref 5–15)
BUN: <5 mg/dL — ABNORMAL LOW (ref 6–20)
CO2: 22 mmol/L (ref 22–32)
Calcium: 8.8 mg/dL — ABNORMAL LOW (ref 8.9–10.3)
Chloride: 101 mmol/L (ref 98–111)
Creatinine, Ser: 0.74 mg/dL (ref 0.61–1.24)
GFR calc Af Amer: >60 mL/min (ref >60)
GFR calc non Af Amer: >60 mL/min (ref >60)
Glucose, Bld: 113 mg/dL — ABNORMAL HIGH (ref 70–99)
Potassium: 2.8 mmol/L — ABNORMAL LOW (ref 3.5–5.1)
Sodium: 137 mmol/L (ref 135–145)

## 2019-08-18 LAB — CBC WITH DIFFERENTIAL/PLATELET
Abs Immature Granulocytes: 0.08 10*3/uL — ABNORMAL HIGH (ref 0.00–0.07)
Basophils Absolute: 0.1 10*3/uL (ref 0.0–0.1)
Basophils Relative: 1 %
Eosinophils Absolute: 0 10*3/uL (ref 0.0–0.5)
Eosinophils Relative: 0 %
HCT: 39.9 % (ref 39.0–52.0)
Hemoglobin: 13.4 g/dL (ref 13.0–17.0)
Immature Granulocytes: 1 %
Lymphocytes Relative: 11 %
Lymphs Abs: 1.5 10*3/uL (ref 0.7–4.0)
MCH: 33.9 pg (ref 26.0–34.0)
MCHC: 33.6 g/dL (ref 30.0–36.0)
MCV: 101 fL — ABNORMAL HIGH (ref 80.0–100.0)
Monocytes Absolute: 1.1 10*3/uL — ABNORMAL HIGH (ref 0.1–1.0)
Monocytes Relative: 9 %
Neutro Abs: 10 10*3/uL — ABNORMAL HIGH (ref 1.7–7.7)
Neutrophils Relative %: 78 %
Platelets: 241 10*3/uL (ref 150–400)
RBC: 3.95 MIL/uL — ABNORMAL LOW (ref 4.22–5.81)
RDW: 13.4 % (ref 11.5–15.5)
WBC: 12.7 10*3/uL — ABNORMAL HIGH (ref 4.0–10.5)
nRBC: 0 % (ref 0.0–0.2)

## 2019-08-18 LAB — MAGNESIUM: Magnesium: 1.6 mg/dL — ABNORMAL LOW (ref 1.7–2.4)

## 2019-08-18 LAB — ETHANOL: Alcohol, Ethyl (B): <10 mg/dL (ref <10)

## 2019-08-18 LAB — ACETAMINOPHEN LEVEL: Acetaminophen (Tylenol), Serum: <10 ug/mL — ABNORMAL LOW (ref 10–30)

## 2019-08-18 LAB — SALICYLATE LEVEL: Salicylate Lvl: <7 mg/dL — ABNORMAL LOW (ref 7.0–30.0)

## 2019-08-18 MED ORDER — POTASSIUM CHLORIDE CRYS ER 20 MEQ PO TBCR
40.0000 meq | EXTENDED_RELEASE_TABLET | Freq: Once | ORAL | Status: AC
  Administered 2019-08-18: 40 meq via ORAL
  Filled 2019-08-18: qty 2

## 2019-08-18 MED ORDER — THIAMINE HCL 100 MG/ML IJ SOLN
100.0000 mg | Freq: Every day | INTRAMUSCULAR | Status: DC
  Administered 2019-08-18: 100 mg via INTRAVENOUS
  Filled 2019-08-18: qty 2

## 2019-08-18 MED ORDER — POTASSIUM CHLORIDE 20 MEQ PO PACK: 40.0000 meq | PACK | Freq: Two times a day (BID) | ORAL | 0 refills | Status: DC

## 2019-08-18 MED ORDER — THIAMINE HCL 100 MG PO TABS: 100.0000 mg | ORAL_TABLET | Freq: Every day | ORAL | Status: DC

## 2019-08-18 MED ORDER — MAGNESIUM 500 MG PO TABS: 500.0000 mg | ORAL_TABLET | Freq: Every day | ORAL | 0 refills | Status: AC

## 2019-08-18 MED ORDER — LORAZEPAM 1 MG PO TABS: 0.0000 mg | ORAL_TABLET | Freq: Two times a day (BID) | ORAL | Status: DC

## 2019-08-18 MED ORDER — LORAZEPAM 2 MG/ML IJ SOLN: 0.0000 mg | Freq: Four times a day (QID) | INTRAMUSCULAR | Status: DC

## 2019-08-18 MED ORDER — LORAZEPAM 1 MG PO TABS
0.0000 mg | ORAL_TABLET | Freq: Four times a day (QID) | ORAL | Status: DC
  Administered 2019-08-18: 1 mg via ORAL
  Filled 2019-08-18: qty 1

## 2019-08-18 MED ORDER — LORAZEPAM 2 MG/ML IJ SOLN: 0.0000 mg | Freq: Two times a day (BID) | INTRAMUSCULAR | Status: DC

## 2019-08-18 MED ORDER — CLONAZEPAM 0.5 MG PO TABS
0.5000 mg | ORAL_TABLET | Freq: Two times a day (BID) | ORAL | 0 refills | Status: DC | PRN
Start: 2019-08-18 — End: 2019-10-04

## 2019-08-18 NOTE — ED Provider Notes (Signed)
1600: Patient handed off to me by previous EDPA at shift change pending magnesium level. Plan to discharge. See below.  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 CIWA 3.  Patient does not believe in hospitals and does not want to be admitted  Spoke to [facility] with neuro recommended klonopin BID at discharge for 1 week.  Plan to f/u on magnesium. DC with Klonopin, K and Mg.    [CG]  [age] yo male w/ hx of chronic etoh abuse and chronic xanax use (BID for the past 6 months) presenting to ED with witnessed seizures.  Last etoh drink 48 hours ago, last benzo use about 30 hours ago (he ran out of xanax).  He is not tremulous here.  We are pending Mg level.  Repleting K+ PO as it is 2.8.  I will start him on clonipin with its longer half-life for 1 week until his PCP can re-prescribe his benzos.  If his Mg is low, the afternoon EDP team was instructed to discharge on PO magnesium in addition to PO potassium.  The patient expresses a strong desire to go home with his wife, and I feel this is reasonable as we can manage him with benzos.  We gave 1 mg ativan here.  No evidence of acute etoh withdrawal at this time or delirium tremens.   [MT]    Clinical Course User Index [CG] Kinnie Feil, PA-C [MT] Wyvonnia Dusky, MD    Procedures  MDM   ER work up personally visualized and interpreted reveals hypokalemia 2.8, repleted orally by previous team.  Hypomagnesemia 1.6, will dc with oral supplementation x 1 week.  Mild leukocytosis CT non acute. EKG without acute findings.  VS normal without tachycardia, hypertension.  Patient has not had another seizure in ER.  He declined admission from previous team.  Dc with klonopin per previous team and neuro, K, Mg.       Kinnie Feil, PA-C 08/18/19  1602    Carmin Muskrat, MD 08/18/19 [phone]

## 2019-08-18 NOTE — ED Notes (Signed)
Per pts wife she thinks the seizure lasted less than 5 minutes. Pt did not change color while seizing. Wife states that the pts whole body was seizing.  Pt states that last time he drank was early Monday morning.

## 2019-08-18 NOTE — ED Triage Notes (Signed)
Pt BIB [facility] from home. Pt had witnessed seizure lasting approximately 10 min. Postictal upon EMS arrival. Pt improved enroute, pt A&Ox4 upon arrival to department. VSS. NAD.

## 2019-08-18 NOTE — ED Notes (Signed)
Pt states that he completed AA "years ago" and that he does not want to do it again. Educated about alcohol abuse and how this is directly affecting his stomach.

## 2019-08-18 NOTE — ED Provider Notes (Signed)
[place] EMERGENCY DEPARTMENT Provider Note   CSN: [id number] Arrival date & time: 08/18/19  1146    History Chief Complaint  Patient presents with  . Seizures   Greg [name] is a 49 y.o. male with history significant for COPD, CAD, hypertension, tobacco abuse, alcohol abuse who presents for evaluation of seizure.  Patient with witnessed seizure like activity lasting approximately 10 minutes. Postictal upon EMS arrival. Patient more alert and oriented on route to ED. Was eating in bed when wife left room and return to generalized tonic clonic behavior. Lasting approximately 5-10 minutes per wife.  Denies headache, vision changes, unilateral weakness, paresthesias, difficulty with word finding, swallowing.  Has had chronic issues with p.o. intake.  Has had decreased appetite over the last year.  Has chronic epigastric abdominal pain. No CP, syncope. Mild neck pain after seizure.  No loss of bowel or bladder incontinence or episode of tongue biting.  Recent falls or trauma.  No history of withdrawal seizures or DTs  Followed by [facility] for chronic epigastric pain, Recent EGD 2020 with gastritis.  Drinks 6 pack beer daily however last drink Sunday/Monday morning 08/16/19  Seen ED 07/03/2019 for "shaking like episodes."  Previously on Xanax- ran out yesterday.  History obtained from patient and past medical records.  No interpreter is used  HPI    Past Medical History:  Diagnosis Date  . Anxiety   . Asthma   . CAD (coronary artery disease) of bypass graft    a. 11/2017 CABG x 3 (LIMA to LAD, SVG->dRCA, SVG->LCX); b. 07/2019 Cath/PCI: LM nl, LAD 50ost, 90p, 19m LCX 90p/m, RCA 30p, 453m80d (2.0x15 Resolute DES), LIMA->mLAD ok, VG->OM3 40, VG->RPDA 100, EF 60%.  . Marland KitchenOPD (chronic obstructive pulmonary disease) (H[facility] [place]  . COPD, severe (HCF[place]icks[place]10/16/2019   fev1 33% predicted   . Daily headache   . ETOH abuse   . GERD (gastroesophageal reflux disease)   .  Hyperlipidemia LDL goal <70   . Hypertension   . Myocardial infarction (HC[facility]  . Skin cancer    "cut off my eyelid and my neck" (11/12/2017) BCC  . Tobacco abuse     Patient Active Problem List   Diagnosis Date Noted  . CAD (coronary artery disease) of bypass graft   . COPD (chronic obstructive pulmonary disease) (HCEk[place]  . Hypertension   . Hyperlipidemia LDL goal <70   . Tobacco abuse   . ETOH abuse   . CAD (coronary artery disease), native coronary artery 07/16/2019  . Generalized abdominal pain 01/04/2019  . Chronic diarrhea 01/04/2019  . GERD (gastroesophageal reflux disease) 01/04/2019  . 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 Unstable angina (HCTresckow  . COPD, severe (HCFair[place]10/16/2019  . NSTEMI (non-ST elevated myocardial infarction) (HCBuckey[facility] [place]10/01/2018  . Coronary artery disease 11/15/2017  . Alcohol abuse 11/15/2017  . Hyperlipidemia 11/15/2017  . Cigarette smoker 11/15/2017  . ACS (acute coronary syndrome) (HCWellington10/10/2017    Past Surgical History:  Procedure Laterality Date  . CORONARY ARTERY BYPASS GRAFT N/A 11/21/2017   Procedure: CORONARY ARTERY BYPASS GRAFTING (CABG) times 3 using left internal mammary artery and right greater saphenous vein harvested endscopically. LIMA to LAD, SVG to Distal Right, SVG to Circ.;  Surgeon: GeGrace IsaacMD;  [facility] Service: Open Heart Surgery;  Laterality: N/A;  . CORONARY STENT INTERVENTION N/A 07/16/2019   Procedure: CORONARY STENT INTERVENTION;  Surgeon: KeTroy SineMD;  [facility];  Service: Cardiovascular;  Laterality: N/A;  . LACERATION REPAIR     "had staples put in my head" (11/12/2017)  . LEFT HEART CATH AND CORONARY ANGIOGRAPHY N/A 11/13/2017   Procedure: LEFT HEART CATH AND CORONARY ANGIOGRAPHY;  Surgeon: Troy Sine, MD;  Location: [name];  Service: Cardiovascular;  Laterality: N/A;  . LEFT HEART CATH AND CORS/GRAFTS ANGIOGRAPHY N/A 07/16/2019   Procedure: LEFT HEART CATH AND  CORS/GRAFTS ANGIOGRAPHY;  Surgeon: Troy Sine, MD;  Location: [place] CV LAB;  Service: Cardiovascular;  Laterality: N/A;  . SKIN CANCER EXCISION     "cut off my eyelid and my neck" (11/12/2017)  . TEE WITHOUT CARDIOVERSION N/A 11/21/2017   Procedure: TRANSESOPHAGEAL ECHOCARDIOGRAM (TEE);  Surgeon: Grace Isaac, MD;  Location: [place];  Service: Open Heart Surgery;  Laterality: N/A;       Family History  Problem Relation Age of Onset  . CAD Mother   . CAD Maternal Grandmother   . CAD Maternal Grandfather   . CAD Paternal Grandmother   . CAD Paternal Grandfather   . Colon polyps Neg Hx   . Esophageal cancer Neg Hx   . Rectal cancer Neg Hx   . Stomach cancer Neg Hx     Social History   Tobacco Use  . Smoking status: Current Every Day Smoker    Packs/day: 2.50    Years: 35.00    Pack years: 87.50    Types: Cigarettes  . Smokeless tobacco: Never Used  Vaping Use  . Vaping Use: Never used  Substance Use Topics  . Alcohol use: Yes    Alcohol/week: 84.0 standard drinks    Types: 84 Cans of beer per week    Comment: 3-4 beers daily  . Drug use: Yes    Types: Marijuana    Comment: 11/12/2017 "qd"    Home Medications Prior to Admission medications   Medication Sig Start Date End Date Taking? Authorizing Provider  albuterol (VENTOLIN HFA) 108 (90 Base) MCG/ACT inhaler Inhale 2 puffs into the lungs every 6 (six) hours as needed for wheezing or shortness of breath. 07/06/18  Yes Tanda Rockers, MD  ALPRAZolam Duanne Moron) 1 MG tablet Take 1 mg by mouth 2 (two) times daily.   Yes [provider]  amLODipine (NORVASC) 2.5 MG tablet Take 1 tablet (2.5 mg total) by mouth daily. 07/17/19  Yes Theora Gianotti, NP  aspirin 81 MG tablet Take 1 tablet (81 mg total) by mouth daily. 07/17/19  Yes Theora Gianotti, NP  atorvastatin (LIPITOR) 80 MG tablet Take 1 tablet (80 mg total) by mouth daily at 6 PM. 11/26/17  Yes Harriet Pho, Tessa N, PA-C    Glycopyrrolate-Formoterol (BEVESPI AEROSPHERE) 9-4.8 MCG/ACT AERO Inhale 2 puffs into the lungs 2 (two) times daily. 03/08/19  Yes Tanda Rockers, MD  metoprolol tartrate (LOPRESSOR) 25 MG tablet Take 1.5 tablets (37.5 mg total) by mouth 2 (two) times daily. 07/17/19  Yes Theora Gianotti, NP  nicotine (NICODERM CQ - DOSED IN MG/24 HOURS) 14 mg/24hr patch Place 1 patch (14 mg total) onto the skin daily. 07/17/19  Yes Theora Gianotti, NP  nitroGLYCERIN (NITROSTAT) 0.4 MG SL tablet Place 0.4 mg under the tongue every 5 (five) minutes as needed for chest pain.  01/15/19  Yes [provider]  pantoprazole (PROTONIX) 40 MG tablet Take 1 tablet by mouth once daily Patient taking differently: Take 40 mg by mouth daily.  03/14/19  Yes Thornton Park, MD  ticagrelor (BRILINTA) 90 MG TABS tablet Take 1 tablet (90 mg total) by mouth 2 (two) times daily. 07/17/19  Yes Theora Gianotti, NP   Allergies    Patient has no known allergies.  Review of Systems   Review of Systems  Constitutional: Negative.   HENT: Negative.   Respiratory: Negative.   Cardiovascular: Negative.   Gastrointestinal: Negative.   Genitourinary: Negative.   Musculoskeletal: Negative.   Skin: Negative.   Neurological: Positive for seizures. Negative for dizziness, tremors, syncope, facial asymmetry, speech difficulty, weakness, light-headedness, numbness and headaches.  All other systems reviewed and are negative.  Physical Exam Updated Vital Signs BP 118/85   Pulse 98   Resp 13   SpO2 100%   Physical Exam Physical Exam  Constitutional: Pt is oriented to person, place, and time. Pt appears well-developed and well-nourished. No distress.  HENT:  Head: Normocephalic and atraumatic.  Mouth/Throat: Oropharynx is clear and moist.  Eyes: Conjunctivae and EOM are normal. Pupils are equal, round, and reactive to light. No scleral icterus.  No horizontal, vertical or rotational nystagmus  Neck:  Normal range of motion. Neck supple.  Full active and passive ROM without pain No midline or paraspinal tenderness No nuchal rigidity or meningeal signs  Cardiovascular: Normal rate, regular rhythm and intact distal pulses.   Pulmonary/Chest: Effort normal and breath sounds normal. No respiratory distress. Pt has no wheezes. No rales.  Abdominal: Soft. Bowel sounds are normal. There is no tenderness. There is no rebound and no guarding.  Musculoskeletal: Normal range of motion.  Lymphadenopathy:    No cervical adenopathy.  Neurological: Pt. is alert and oriented to person, place, and time. He has normal reflexes. No cranial nerve deficit.  Exhibits normal muscle tone. Coordination normal.  Mental Status:  Alert, oriented, thought content appropriate. Speech fluent without evidence of aphasia. Able to follow 2 step commands without difficulty.  Cranial Nerves:  II:  Peripheral visual fields grossly normal, pupils equal, round, reactive to light III,IV, VI: ptosis not present, extra-ocular motions intact bilaterally  V,VII: smile symmetric, facial light touch sensation equal VIII: hearing grossly normal bilaterally  IX,X: midline uvula rise  XI: bilateral shoulder shrug equal and strong XII: midline tongue extension  Motor:  5/5 in upper and lower extremities bilaterally including strong and equal grip strength and dorsiflexion/plantar flexion Sensory: Pinprick and light touch normal in all extremities.  Deep Tendon Reflexes: 2+ and symmetric  Cerebellar: normal finger-to-nose with bilateral upper extremities Gait: normal gait and balance CV: distal pulses palpable throughout   Skin: Skin is warm and dry. No rash noted. Pt is not diaphoretic.  Psychiatric: Pt has a normal mood and affect. Behavior is normal. Judgment and thought content normal.  Nursing note and vitals reviewed. ED Results / Procedures / Treatments   Labs (all labs ordered are listed, but only abnormal results are  displayed) Labs Reviewed  CBC WITH DIFFERENTIAL/PLATELET - Abnormal; Notable for the following components:      Result Value   WBC 12.7 (*)    RBC 3.95 (*)    MCV 101.0 (*)    Neutro Abs 10.0 (*)    Monocytes Absolute 1.1 (*)    Abs Immature Granulocytes 0.08 (*)    All other components within normal limits  BASIC METABOLIC PANEL - Abnormal; Notable for the following components:   Potassium 2.8 (*)    Glucose, Bld 113 (*)    BUN <5 (*)    Calcium 8.8 (*)    All  other components within normal limits  ACETAMINOPHEN LEVEL - Abnormal; Notable for the following components:   Acetaminophen (Tylenol), Serum <10 (*)    All other components within normal limits  SALICYLATE LEVEL - Abnormal; Notable for the following components:   Salicylate Lvl <3.3 (*)    All other components within normal limits  ETHANOL  RAPID URINE DRUG SCREEN, HOSP PERFORMED  URINALYSIS, ROUTINE W REFLEX MICROSCOPIC  MAGNESIUM  CBG MONITORING, ED    EKG EKG Interpretation  Date/Time:  Wednesday August 18 2019 13:16:18 EDT Ventricular Rate:  94 PR Interval:    QRS Duration: 93 QT Interval:  384 QTC Calculation: 481 R Axis:   72 Text Interpretation: Sinus rhythm Borderline prolonged QT interval No STWMI Confirmed by Octaviano Glow [phone]) on 08/18/2019 1:31:43 PM   Radiology CT Head Wo Contrast  Result Date: 08/18/2019 CLINICAL DATA:  Nontraumatic seizure. EXAM: CT HEAD WITHOUT CONTRAST TECHNIQUE: Contiguous axial images were obtained from the base of the skull through the vertex without intravenous contrast. COMPARISON:  CT head 07/03/2019 FINDINGS: Brain: There is no evidence of acute intracranial hemorrhage, mass lesion, brain edema or extra-axial fluid collection. Stable mild cerebral atrophy with mild prominence of the subarachnoid spaces. There is no CT evidence of acute cortical infarction. Vascular: Intracranial vascular calcifications. No hyperdense vessel identified. Skull: Negative for fracture or  focal lesion. Sinuses/Orbits: Minimal maxillary and ethmoid sinus mucosal thickening without air-fluid levels. The mastoid air cells and middle ears are clear. Probable cerumen in the right external auditory canal. No significant orbital findings. Other: None. IMPRESSION: Stable head CT without acute intracranial findings. Electronically Signed   By: Richardean Sale M.D.   On: 08/18/2019 14:34    Procedures Procedures (including critical care time)  Medications Ordered in ED Medications  LORazepam (ATIVAN) injection 0-4 mg ( Intravenous See Alternative 08/18/19 1439)    Or  LORazepam (ATIVAN) tablet 0-4 mg (1 mg Oral Given 08/18/19 1439)  LORazepam (ATIVAN) injection 0-4 mg (has no administration in time range)    Or  LORazepam (ATIVAN) tablet 0-4 mg (has no administration in time range)  thiamine tablet 100 mg ( Oral See Alternative 08/18/19 1331)    Or  thiamine (B-1) injection 100 mg (100 mg Intravenous Given 08/18/19 1331)  potassium chloride SA (KLOR-CON) CR tablet 40 mEq (40 mEq Oral Given 08/18/19 1439)   ED Course  I have reviewed the triage vital signs and the nursing notes.  Pertinent labs & imaging results that were available during my care of the patient were reviewed by me and considered in my medical decision making (see chart for details).  49 year old presents for evaluation of seizure. Witnessed from family earlier today. Afebrile, non septic, non ill appearing. Seen previously for shaking episode in ED. Hx of EtOh abuse, last drink Sunday/Monday morning.  Nonfocal neuro exam without deficits.  No sudden onset thunderclap headache, weakness. Patient also with hx of chronic Xanax use. Last use 24 hours ago 2/2 running out. Patient does not want admission at this time.  Low suspicion for acute CVA, dissection.  Plan on labs, imaging and reassess.  Labs and imaging personally reviewed and interpreted: CBC with mild leukocytosis at 12.7 BMP with hypokalemia at 2.8 will replace and  get Mag level given hx of EtOh use. EKG without STEMI Ethanol <84, Salicylate <7, Acetaminophen <10 CT head WO acute findings   Care transferred to [facility] who will follow up on remaining labs. Patient does not want admission understands he  may have life or limb treatening condition if he leaves the hospital. Appear to have capacity to make medial decisions. Plan to likely dc home if remaining workup unremarkable. Will needs F/U with Neuro given second possible seizure like episode under if withdrawal related.  Paged Dr. Rory Percy with [facility] however have not hear back. I did speak with Shanon Brow with Neuro who thought patient would benefit from more of Klonopin RX given longer half life. He also recommends patient sensation for EtOh while taking benzos.   Patient seen and evaluated by attending, Dr. Langston Masker who agrees above treatment, plan and disposition  Clinical Course as of Aug 17 1520  Wed Aug 18, 2019  1502 CC: witnessed seizure by wife H/o ETOH and bzd use, last drink Sunday  Suspect BZD > ETOH. CIWA 3.  Patient does not believe in hospitals and does not want to be admitted  Spoke to [facility] with neuro recommended klonopin BID at discharge for 1 week.  Plan to f/u on magnesium. DC with Klonopin, K and Mg.    [CG]  24 49 yo male w/ hx of chronic etoh abuse and chronic xanax use (BID for the past 6 months) presenting to ED with witnessed seizures.  Last etoh drink 48 hours ago, last benzo use about 30 hours ago (he ran out of xanax).  He is not tremulous here.  We are pending Mg level.  Repleting K+ PO as it is 2.8.  I will start him on clonipin with its longer half-life for 1 week until his PCP can re-prescribe his benzos.  If his Mg is low, the afternoon EDP team was instructed to discharge on PO magnesium in addition to PO potassium.  The patient expresses a strong desire to go home with his wife, and I feel this is reasonable as we can manage him with benzos.  We gave 1 mg ativan here.  No  evidence of acute etoh withdrawal at this time or delirium tremens.   [MT]    Clinical Course User Index [CG] Kinnie Feil, PA-C [MT] Langston Masker Carola Rhine, MD   MDM Rules/Calculators/A&P                           Final Clinical Impression(s) / ED Diagnoses Final diagnoses:  Seizure [facility])    Rx / DC Orders ED Discharge Orders    None       Cesily Cuoco A, PA-C 08/18/19 1522    Wyvonnia Dusky, MD 08/18/19 1811

## 2019-08-18 NOTE — Discharge Instructions (Addendum)
You were seen in the ED for seizures  You declined admission  Seizures are likely from sudden stop of alcohol or benzodiazapine  Klonopin was prescribed to you for 1 week  Follow up with primary care doctor  Return for recurrent seizure, hallucinations, altered mental status  Your potassium and magnesium were low, please take supplements as prescribed

## 2019-08-26 ENCOUNTER — Encounter: Payer: Self-pay | Admitting: Neurology

## 2019-09-15 ENCOUNTER — Ambulatory Visit: Payer: Medicaid Other | Admitting: Neurology

## 2019-10-03 NOTE — Progress Notes (Signed)
Cardiology Office Note   Date:  10/04/2019   ID:  Greg, [name] 09/28/1970, MRN [id number]  PCP:  Philmore Pali, NP  Cardiologist:   Dorris Carnes, MD    F/U of CAD    History of Present Illness: Greg [name] is a 49 y.o. male with a history of CAD (s/p CABG in 2018); HTN, HL, tobacco abuse , COPD   He wa recently discharged from [facility] (June 2021)  He underwent LHC which revealed 2/3 grafts patent.   SVG to PDA occluded.  LIMA to LAD patnet SVG to OM3 patent. (full report is below)   Since d/c from the hospital he notes rare CP    He does complain of bad headaches  ? If they are caused from one of his meds     The pt denies dizziness   Bruises easily .    Current Meds  Medication Sig  . albuterol (VENTOLIN HFA) 108 (90 Base) MCG/ACT inhaler Inhale 2 puffs into the lungs every 6 (six) hours as needed for wheezing or shortness of breath.  . ALPRAZolam (XANAX) 1 MG tablet Take 1 mg by mouth 3 (three) times daily.   Marland Kitchen amLODipine (NORVASC) 2.5 MG tablet Take 1 tablet (2.5 mg total) by mouth daily.  Marland Kitchen aspirin 81 MG tablet Take 1 tablet (81 mg total) by mouth daily.  Marland Kitchen atorvastatin (LIPITOR) 80 MG tablet Take 1 tablet (80 mg total) by mouth daily at 6 PM.  . Glycopyrrolate-Formoterol (BEVESPI AEROSPHERE) 9-4.8 MCG/ACT AERO Inhale 2 puffs into the lungs 2 (two) times daily.  . metoprolol tartrate (LOPRESSOR) 25 MG tablet Take 1.5 tablets (37.5 mg total) by mouth 2 (two) times daily.  . nicotine (NICODERM CQ - DOSED IN MG/24 HOURS) 14 mg/24hr patch Place 1 patch (14 mg total) onto the skin daily.  . nitroGLYCERIN (NITROSTAT) 0.4 MG SL tablet Place 0.4 mg under the tongue every 5 (five) minutes as needed for chest pain.   . pantoprazole (PROTONIX) 40 MG tablet Take 1 tablet by mouth once daily (Patient taking differently: Take 40 mg by mouth daily. )  . ticagrelor (BRILINTA) 90 MG TABS tablet Take 1 tablet (90 mg total) by mouth 2 (two) times daily.     Allergies:   Patient  has no known allergies.   Past Medical History:  Diagnosis Date  . Anxiety   . Asthma   . CAD (coronary artery disease) of bypass graft    a. 11/2017 CABG x 3 (LIMA to LAD, SVG->dRCA, SVG->LCX); b. 07/2019 Cath/PCI: LM nl, LAD 50ost, 90p, 54m LCX 90p/m, RCA 30p, 49m80d (2.0x15 Resolute DES), LIMA->mLAD ok, VG->OM3 40, VG->RPDA 100, EF 60%.  . Marland KitchenOPD (chronic obstructive pulmonary disease) ([facility]elline  . COPD, severe (HCEtowah10/16/2019   fev1 33% predicted   . Daily headache   . ETOH abuse   . GERD (gastroesophageal reflux disease)   . Hyperlipidemia LDL goal <70   . Hypertension   . Myocardial infarction ([facility]  . Skin cancer    "cut off my eyelid and my neck" (11/12/2017) BCC  . Tobacco abuse     Past Surgical History:  Procedure Laterality Date  . CORONARY ARTERY BYPASS GRAFT N/A 11/21/2017   Procedure: CORONARY ARTERY BYPASS GRAFTING (CABG) times 3 using left internal mammary artery and right greater saphenous vein harvested endscopically. LIMA to LAD, SVG to Distal Right, SVG to Circ.;  Surgeon: GeGrace IsaacMD;  [facility] Service:  Open [facility] Surgery;  Laterality: N/A;  . CORONARY STENT INTERVENTION N/A 07/16/2019   Procedure: CORONARY STENT INTERVENTION;  Surgeon: Troy Sine, MD;  Location: [facility];  Service: Cardiovascular;  Laterality: N/A;  . LACERATION REPAIR     "had staples put in my head" (11/12/2017)  . LEFT HEART CATH AND CORONARY ANGIOGRAPHY N/A 11/13/2017   Procedure: LEFT HEART CATH AND CORONARY ANGIOGRAPHY;  Surgeon: Troy Sine, MD;  Location: [facility];  Service: Cardiovascular;  Laterality: N/A;  . LEFT HEART CATH AND CORS/GRAFTS ANGIOGRAPHY N/A 07/16/2019   Procedure: LEFT HEART CATH AND CORS/GRAFTS ANGIOGRAPHY;  Surgeon: Troy Sine, MD;  Location: [facility];  Service: Cardiovascular;  Laterality: N/A;  . SKIN CANCER EXCISION     "cut off my eyelid and my neck" (11/12/2017)  . TEE WITHOUT CARDIOVERSION N/A  11/21/2017   Procedure: TRANSESOPHAGEAL ECHOCARDIOGRAM (TEE);  Surgeon: Grace Isaac, MD;  Location: [facility];  Service: Open Heart Surgery;  Laterality: N/A;     Social History:  The patient  reports that he has been smoking cigarettes. He has a 87.50 pack-year smoking history. He has never used smokeless tobacco. He reports current alcohol use of about 84.0 standard drinks of alcohol per week. He reports current drug use. Drug: Marijuana.   Family History:  The patient's family history includes CAD in his maternal grandfather, maternal grandmother, mother, paternal grandfather, and paternal grandmother.    ROS:  Please see the history of present illness. All other systems are reviewed and  Negative to the above problem except as noted.    PHYSICAL EXAM: VS:  BP 140/88   Pulse 99   Ht 5' 10"  (1.778 m)   Wt 151 lb (68.5 kg)   SpO2 99%   BMI 21.67 kg/m   GEN: Thin 49 yo, in no acute distress  HEENT: normal  Neck: no JVD, carotid bruits Cardiac: RRR; no murmurs,  No LE  edema  Respiratory:  clear to auscultation bilaterally, normal work of breathing GI: soft, nontender, nondistended, + BS  No hepatomegaly  MS: no deformity Moving all extremities   Skin: warm and dry, no rash Neuro:  Strength and sensation are intact Psych: euthymic mood, full affect   EKG:  EKG is not ordered today.  CARDIAC CATH   07/16/19    Ost LAD lesion is 50% stenosed.  Mid LAD lesion is 80% stenosed.  Dist RCA lesion is 80% stenosed.  Prox RCA lesion is 30% stenosed.  Prox RCA to Mid RCA lesion is 40% stenosed.  Mid RCA lesion is 40% stenosed.  Prox Cx to Mid Cx lesion is 90% stenosed.  Origin lesion is 100% stenosed.  Post intervention, there is a 0% residual stenosis.  Prox LAD lesion is 90% stenosed.  The left ventricular systolic function is normal.  LV end diastolic pressure is moderately elevated.  Dist Graft to Insertion lesion is 40% stenosed.  A stent was successfully  placed.   Severe multivessel native coronary obstructive disease with 50% proximal 90% proximal and 80% stenosis after the diagonal vessel with competitive filling via the LIMA graft; 90% proximal circumflex stenosis with competitive filling due to the vein graft; and RCA with 30%, 40% and 40% proximal to mid stenosis with 80% mid distal stenosis prior to the PDA and PLA takeoff for competitive filling.  Patent LIMA graft supplying the mid LAD.  Patent SVG supplying the circumflex marginal vessel but with evidence for 40% distal  graft stenosis.  Occluded vein graft which had supplied the distal RCA.  Function with EF estimated 60%; LVEDP elevated at 24 mm.  Successful PCI to the RCA with ultimate insertion of a 2.0 x 15 mm Resolute Onyx stent postdilated to 2.20 mm with the 80% stenosis being reduced to 0%.  RECOMMENDATION: DAPT for minimum of 6 months but probably longer.  Smoking cessation is essential.  Medical therapy for concomitant CAD.  Aggressive lipid-lowering therapy with target LDL less than 70.   Lipid Panel    Component Value Date/Time   CHOL 204 (H) 11/14/2017 2304   TRIG 76 11/14/2017 2304   HDL 75 11/14/2017 2304   CHOLHDL 2.7 11/14/2017 2304   VLDL 15 11/14/2017 2304   LDLCALC 114 (H) 11/14/2017 2304      Wt Readings from Last 3 Encounters:  10/04/19 151 lb (68.5 kg)  07/16/19 154 lb (69.9 kg)  07/09/19 154 lb (69.9 kg)      ASSESSMENT AND PLAN:  1  CAD  Pt is s/p recent intervention  Note rare CP   Keep on ASA/ Brilinta   Will contiinue other meds  I will see in cilnic in Jan 2022  2   HTN  Stop amlodipine follow response to HA   Follow BP at home   Write in at end of week to [facility] for response     Will prob need another agent  I will be in touch with pt at end of week   Set up appt in HTN clinic for continued f/u of BP   3  HL   Check lipids / livertoday   Check:  CBC, BMET liver and lipid   F/U in 2 months    Current medicines are reviewed  at length with the patient today.  The patient does not have concerns regarding medicines.  Signed, Dorris Carnes, MD  10/04/2019 2:28 PM    [facility] [address], [place], [place]  [address] Phone: [phone]; Fax: [phone]

## 2019-10-04 ENCOUNTER — Ambulatory Visit (INDEPENDENT_AMBULATORY_CARE_PROVIDER_SITE_OTHER): Payer: Medicaid Other | Admitting: Internal Medicine

## 2019-10-04 ENCOUNTER — Encounter: Payer: Self-pay | Admitting: Internal Medicine

## 2019-10-04 ENCOUNTER — Other Ambulatory Visit: Payer: Self-pay

## 2019-10-04 VITALS — BP 140/88 | HR 99 | Ht 70.0 in | Wt 151.0 lb

## 2019-10-04 DIAGNOSIS — E785 Hyperlipidemia, unspecified: Secondary | ICD-10-CM

## 2019-10-04 IMAGING — CR DG CHEST 1V PORT
1 series · 1 of 1 positions shown · non-contrast
Comparison: PA and lateral chest 11/14/2017.  CT chest 11/12/2017.

CLINICAL DATA: Status post CABG today.  Incorrect instrument count.

EXAM:
PORTABLE CHEST 1 VIEW

[AP]
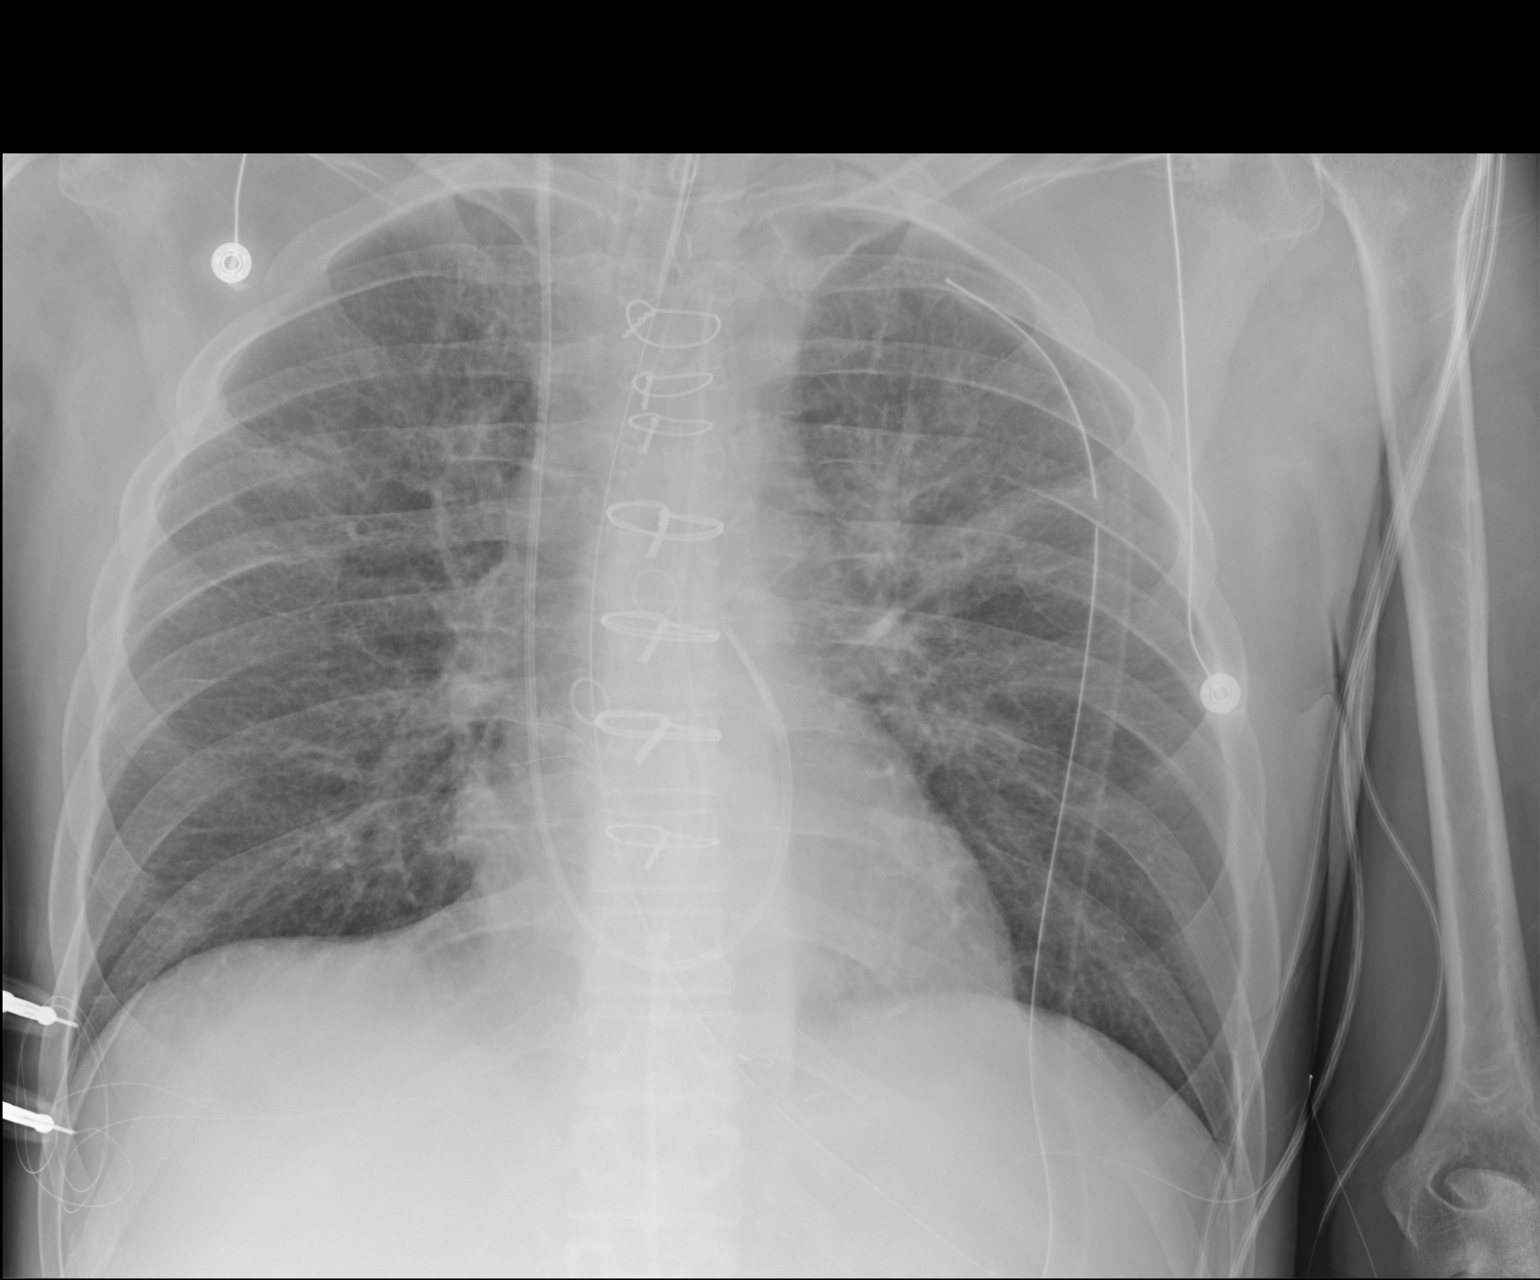

[1 of 1 positions shown; findings below may reference images not displayed]

FINDINGS: No unexpected radiopaque foreign body is identified. Right IJ
approach Swan-Ganz catheter is in the distal most pulmonary outflow
tract. Left chest tube and mediastinal drain are in place. NG tube
courses into the stomach and below the inferior margin of the film.
Endotracheal tube tip is at the level of the clavicular heads. Mild,
scattered atelectasis is noted. Lungs are otherwise clear. Emphysema
noted. No pneumothorax or pleural effusion. No pulmonary edema.
Heart size is normal.
IMPRESSION: Negative for a unexpected radiopaque foreign body.

Support tubes and lines project in good position. No pneumothorax.
No acute disease.

## 2019-10-04 NOTE — Patient Instructions (Addendum)
Medication Instructions:  Your physician has recommended you make the following change in your medication:  1.) STOP AMLODIPINE   *If you need a refill on your cardiac medications before your next appointment, please call your pharmacy*   Lab Work: Today: BMET, CBC, LIPIDS, LIVER FUNCTION  If you have labs (blood work) drawn today and your tests are completely normal, you will receive your results only by: Marland Kitchen MyChart Message (if you have MyChart) OR . A paper copy in the mail If you have any lab test that is abnormal or we need to change your treatment, we will call you to review the results.   Testing/Procedures: NONE   Follow-Up: At [facility], you and your health needs are our priority.  As part of our continuing mission to provide you with exceptional heart care, we have created designated Provider Care Teams.  These Care Teams include your primary Cardiologist (physician) and Advanced Practice Providers (APPs -  Physician Assistants and Nurse Practitioners) who all work together to provide you with the care you need, when you need it.  Your next appointment:   4 month(s) (January)  The format for your next appointment:   In Person  Provider:   You may see Dorris Carnes, MD or one of the following Advanced Practice Providers on your designated Care Team:    Richardson Dopp, PA-C  Robbie Lis, [place]    Other Instructions [facility]

## 2019-10-06 LAB — CBC
Hematocrit: 42.5 % (ref 37.5–51.0)
Hemoglobin: 14.7 g/dL (ref 13.0–17.7)
MCH: 33.6 pg — ABNORMAL HIGH (ref 26.6–33.0)
MCHC: 34.6 g/dL (ref 31.5–35.7)
MCV: 97 fL (ref 79–97)
Platelets: 280 10*3/uL (ref 150–450)
RBC: 4.37 x10E6/uL (ref 4.14–5.80)
RDW: 12.7 % (ref 11.6–15.4)
WBC: 6.2 10*3/uL (ref 3.4–10.8)

## 2019-10-06 LAB — BASIC METABOLIC PANEL
BUN/Creatinine Ratio: 12 (ref 9–20)
BUN: 12 mg/dL (ref 6–24)
CO2: 23 mmol/L (ref 20–29)
Calcium: 9 mg/dL (ref 8.7–10.2)
Chloride: 101 mmol/L (ref 96–106)
Creatinine, Ser: 1.04 mg/dL (ref 0.76–1.27)
GFR calc Af Amer: 96 mL/min/{1.73_m2} (ref >59)
GFR calc non Af Amer: 83 mL/min/{1.73_m2} (ref >59)
Glucose: 127 mg/dL — ABNORMAL HIGH (ref 65–99)
Potassium: 4.7 mmol/L (ref 3.5–5.2)
Sodium: 140 mmol/L (ref 134–144)

## 2019-10-06 LAB — HEPATIC FUNCTION PANEL
ALT: 133 IU/L — ABNORMAL HIGH (ref 0–44)
AST: 445 IU/L — ABNORMAL HIGH (ref 0–40)
Albumin: 4 g/dL (ref 3.8–4.9)
Alkaline Phosphatase: 170 IU/L — ABNORMAL HIGH (ref 48–121)
Bilirubin Total: 0.8 mg/dL (ref 0.0–1.2)
Bilirubin, Direct: 0.25 mg/dL (ref 0.00–0.40)
Total Protein: 7.4 g/dL (ref 6.0–8.5)

## 2019-10-06 LAB — LIPID PANEL
Chol/HDL Ratio: 6.5 ratio — ABNORMAL HIGH (ref 0.0–5.0)
Cholesterol, Total: 221 mg/dL — ABNORMAL HIGH (ref 100–199)
HDL: 34 mg/dL — ABNORMAL LOW (ref >39)
Triglycerides: 1254 mg/dL (ref 0–149)

## 2019-10-06 IMAGING — DX DG CHEST 1V PORT
1 series · 1 of 1 positions shown · non-contrast
Comparison: 11/22/2017

CLINICAL DATA: Atelectasis.

EXAM:
PORTABLE CHEST 1 VIEW

[chest ap]
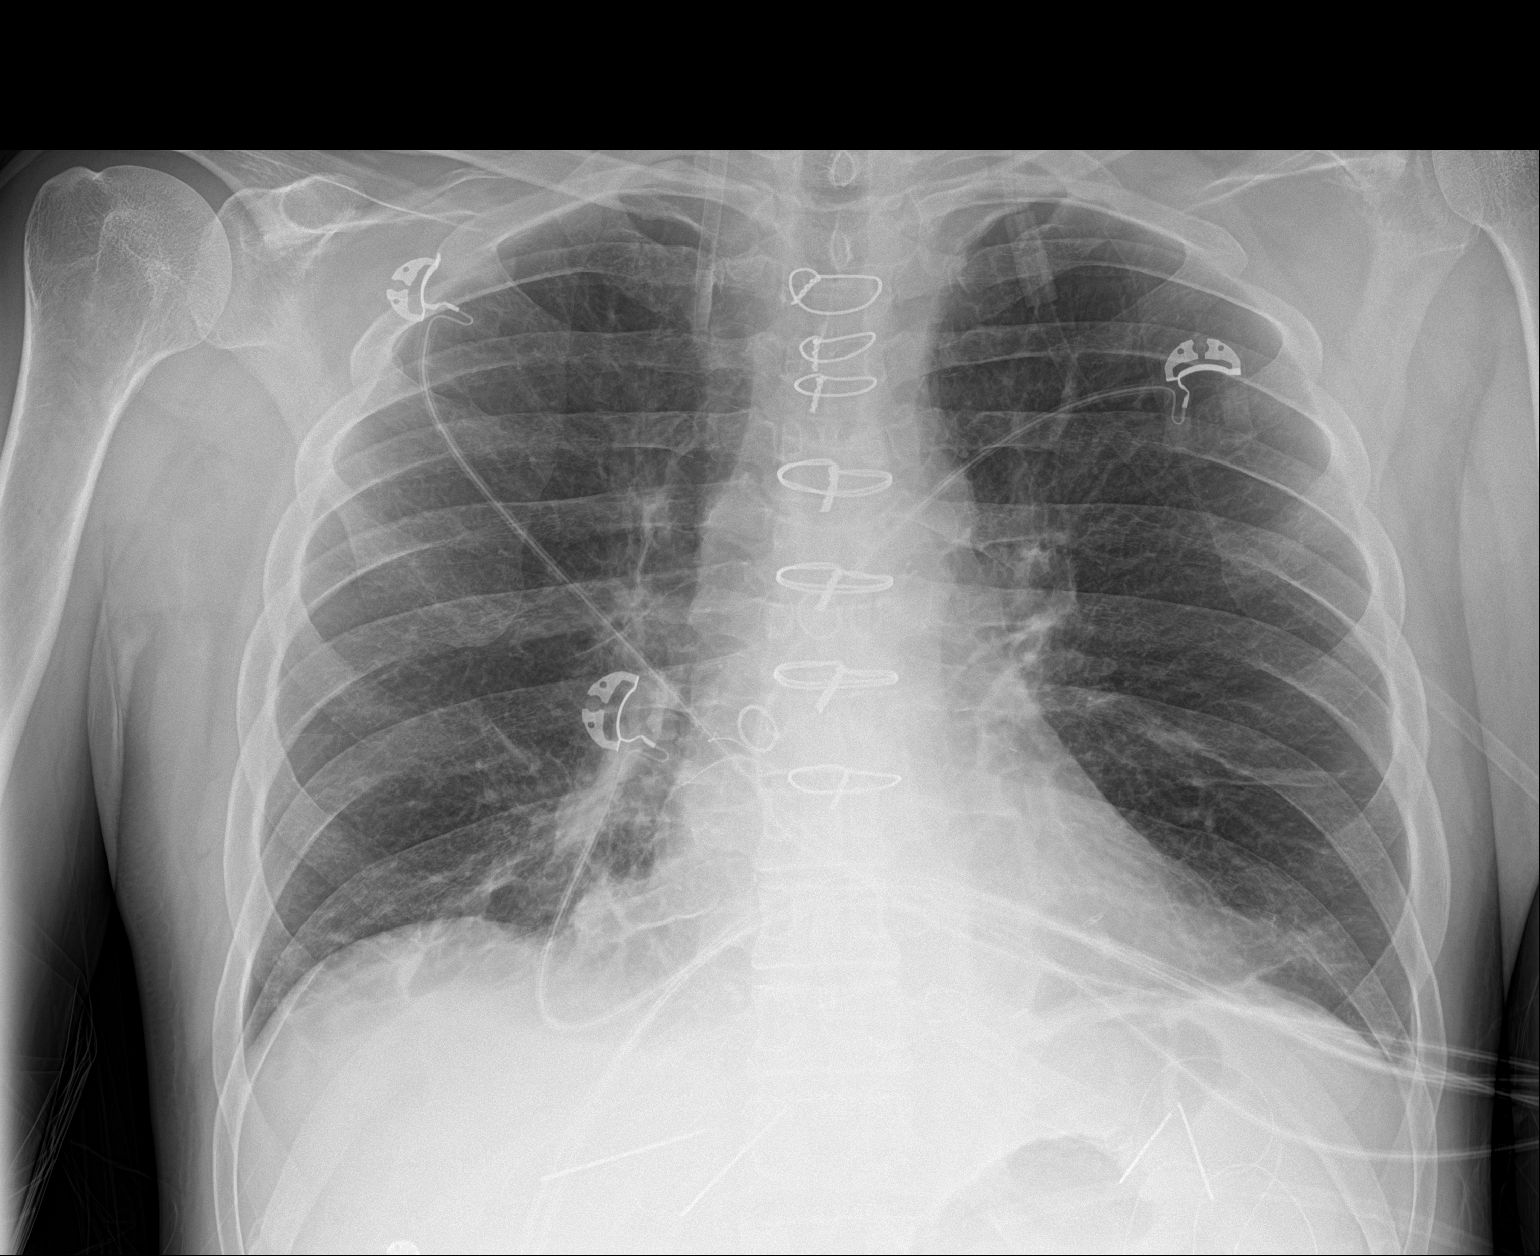

[1 of 1 positions shown; findings below may reference images not displayed]

FINDINGS: Right IJ central venous sheath has tip over the SVC. Lungs are
adequately inflated with minimal bibasilar density likely
atelectasis. No effusion. Cardiomediastinal silhouette and remainder
of the exam is unchanged.
IMPRESSION: Mild bibasilar atelectatic change.

## 2019-10-06 IMAGING — DX DG ABDOMEN 1V
1 series · 1 of 1 positions shown · non-contrast
Comparison: CT 11/12/2017

CLINICAL DATA: Abdominal distention

EXAM:
ABDOMEN - 1 VIEW

[abdomen kub]
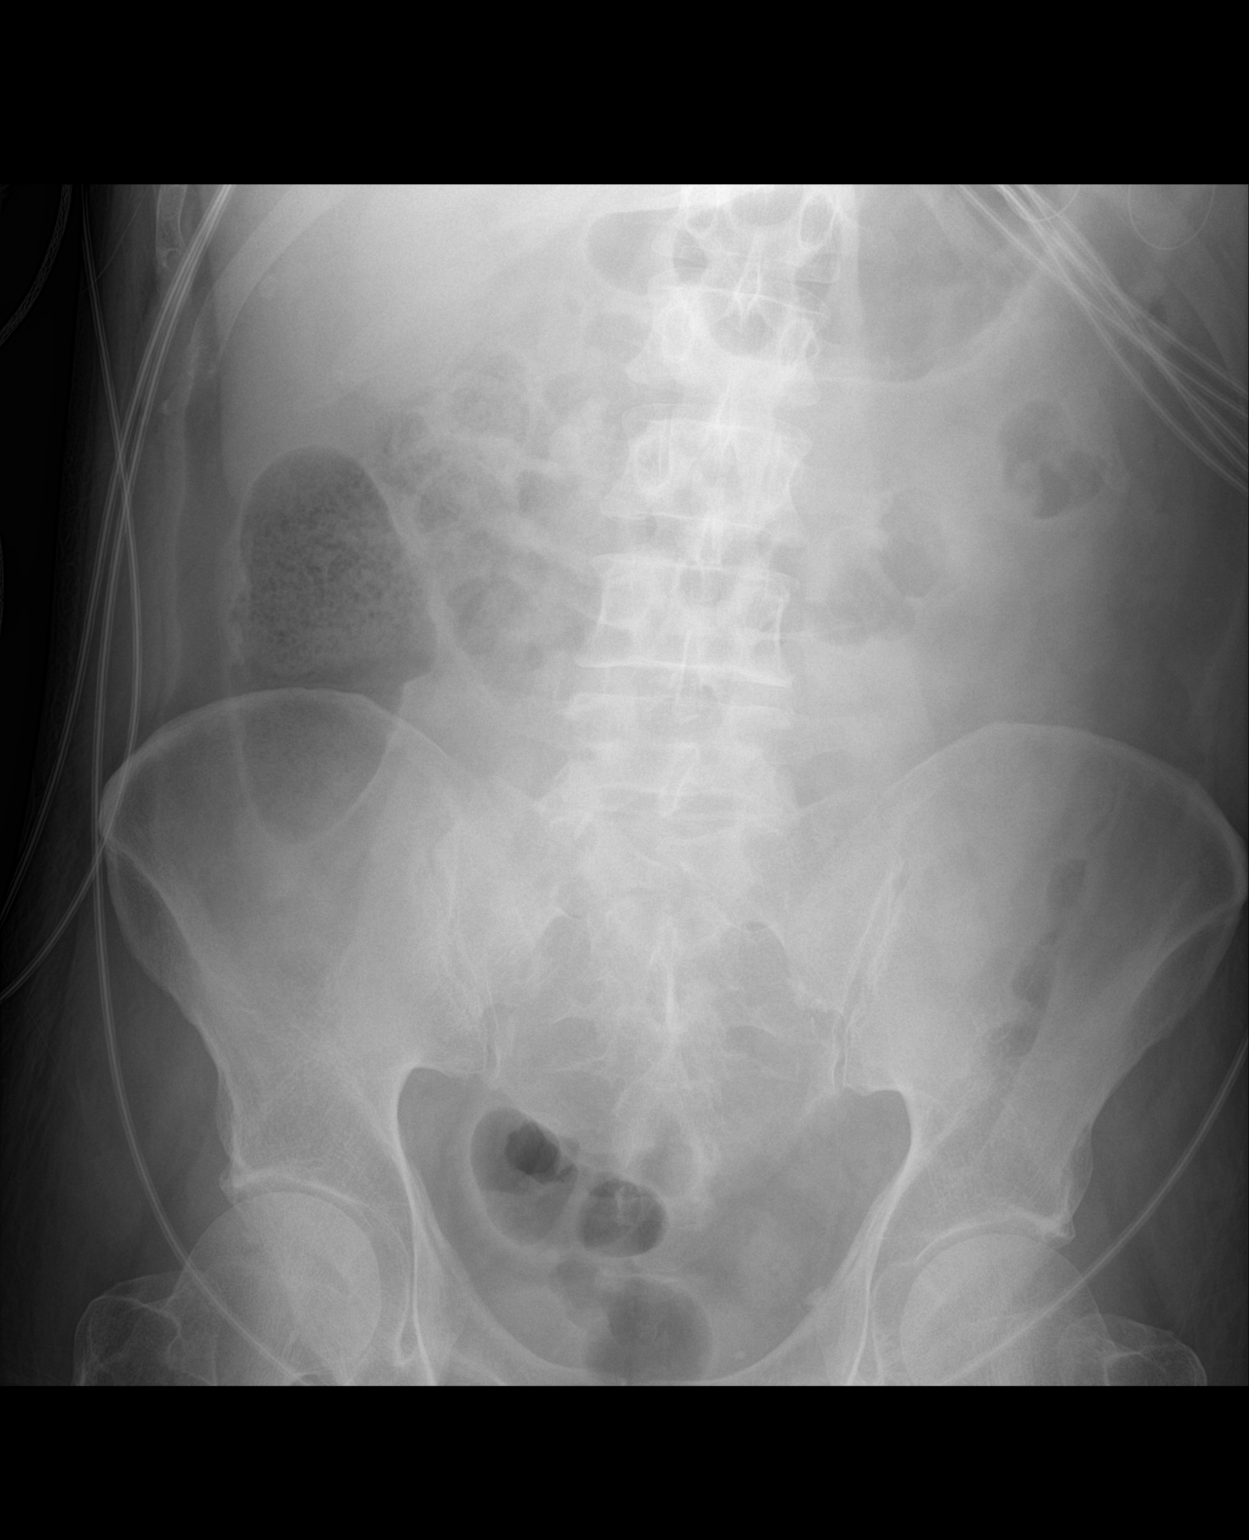

[1 of 1 positions shown; findings below may reference images not displayed]

FINDINGS: Nonobstructive bowel gas pattern. No free air or organomegaly. No
suspicious calcification or acute bony abnormality.
IMPRESSION: No acute findings.

## 2019-10-08 ENCOUNTER — Ambulatory Visit: Payer: Medicaid Other | Admitting: Neurology

## 2019-10-08 IMAGING — DX DG CHEST 2V
2 series · 2 of 2 positions shown · non-contrast
Comparison: Portable chest x-ray of 11/24/2017

CLINICAL DATA: History of CABG on 11/21/2017, follow-up

EXAM:
CHEST - 2 VIEW

[chest pa]
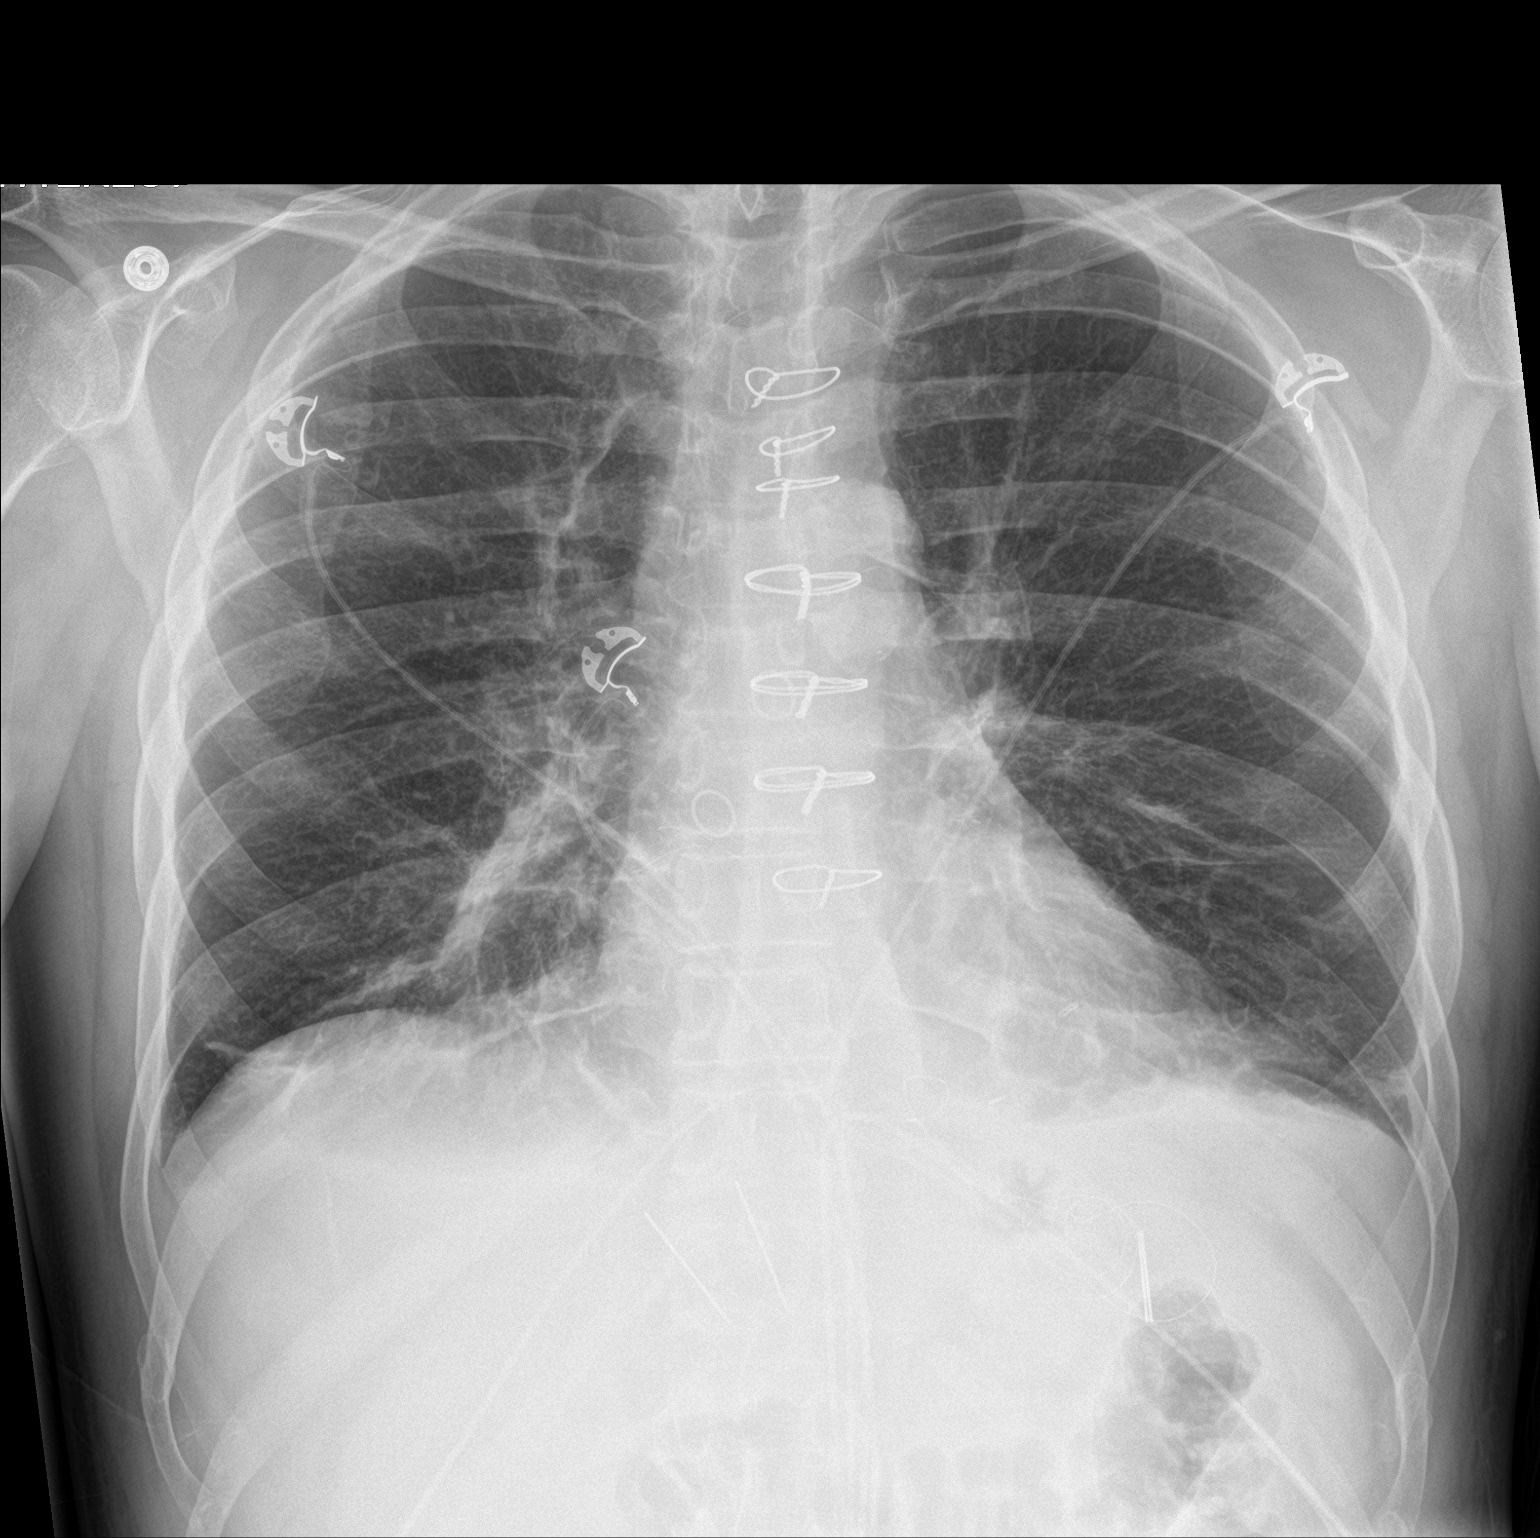

[chest lat]
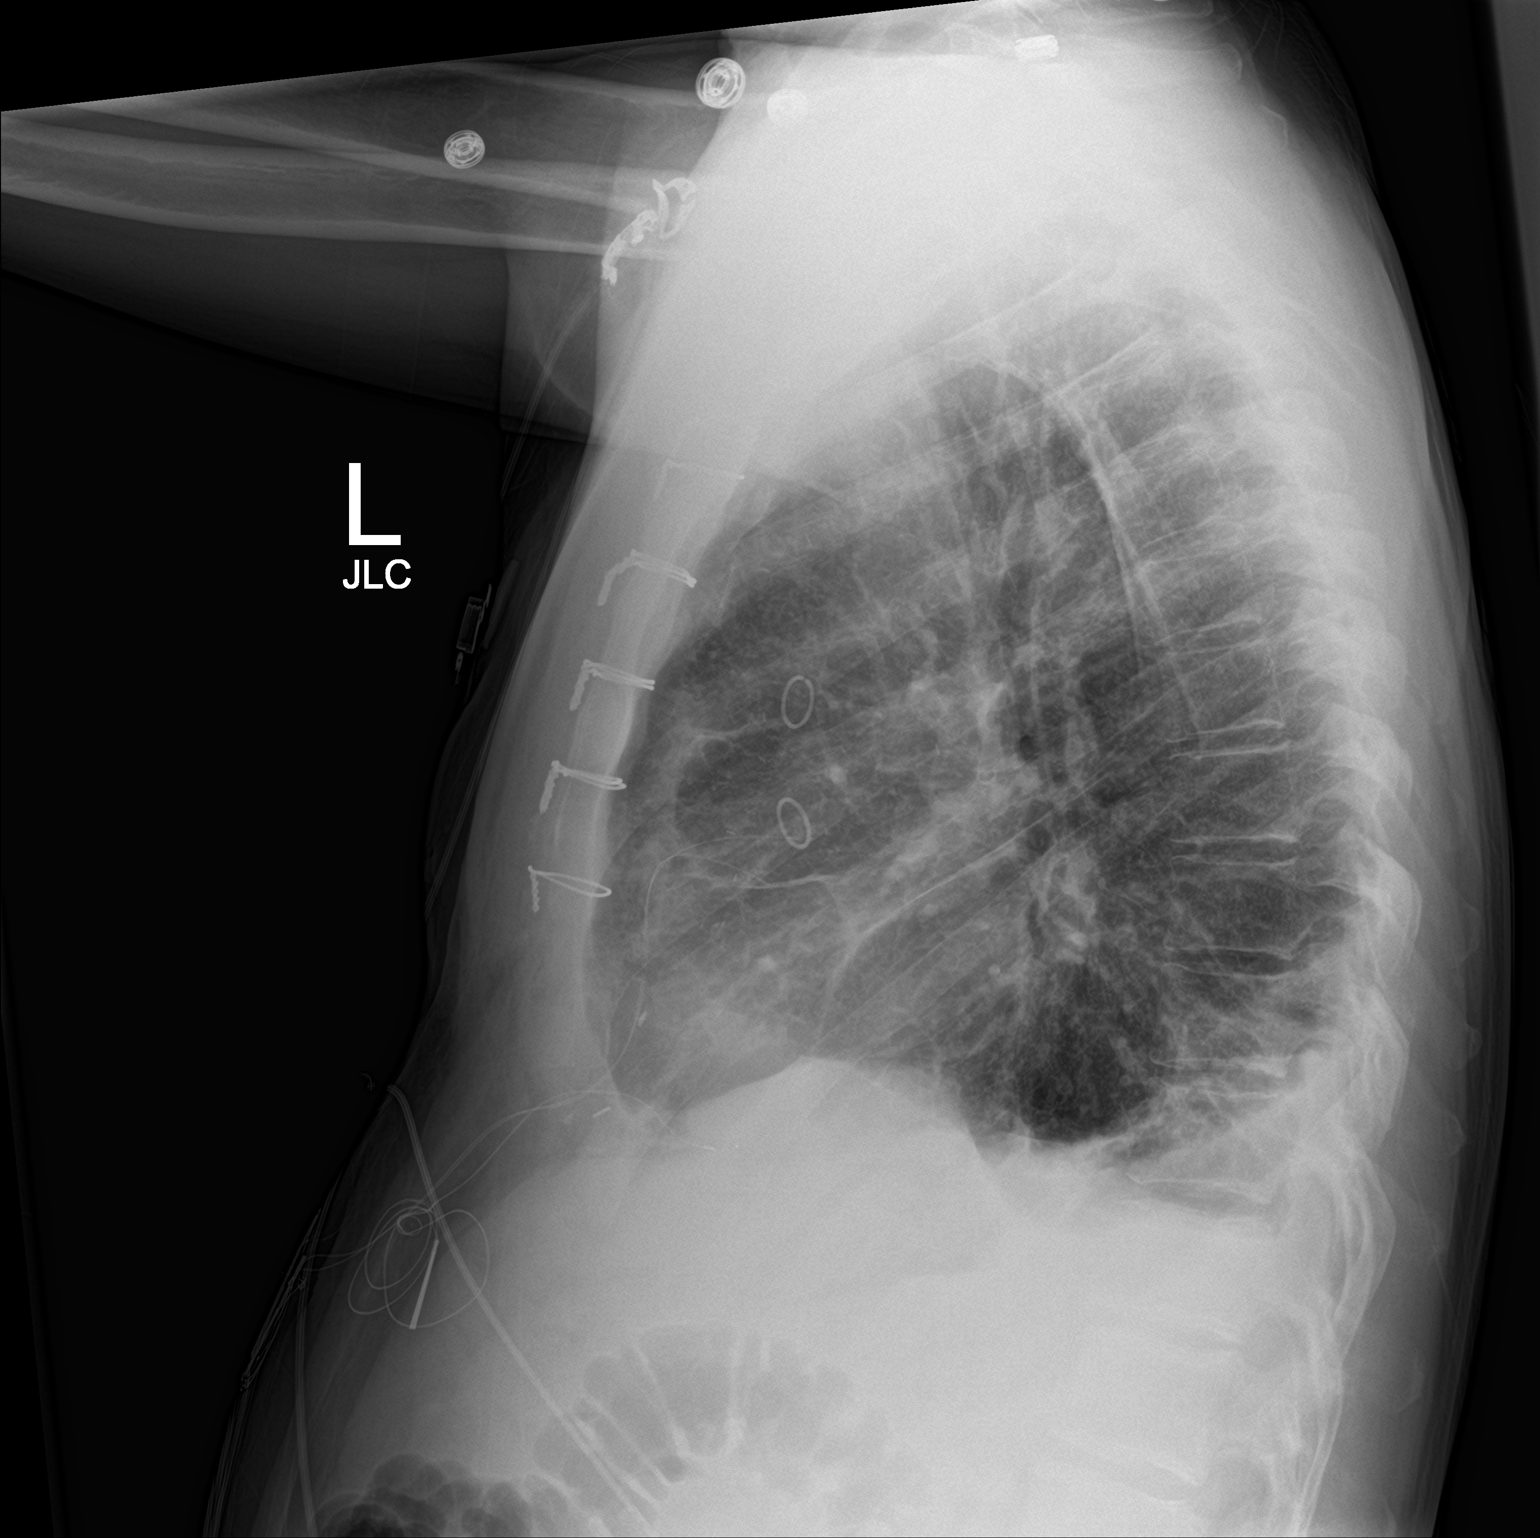

[2 of 2 positions shown; findings below may reference images not displayed]

FINDINGS: There has been some increase in bibasilar linear atelectasis. Small
pleural effusions also remain better seen on the lateral view
posteriorly. No pneumonia is seen and no pneumothorax is evident.
Mediastinal and hilar contours are stable and heart size is stable
with mild cardiomegaly present. Median sternotomy sutures are noted
from CABG. No acute bony abnormality is seen.
IMPRESSION: 1. Slight increase in bibasilar atelectasis with small bilateral
pleural effusions present.
2. No pneumothorax.

## 2019-10-12 ENCOUNTER — Telehealth: Payer: Self-pay | Admitting: Internal Medicine

## 2019-10-12 NOTE — Telephone Encounter (Signed)
Blood pressures overall look good  Make sure that the next time patient goes to doctor's office he bring his cuff with him to confirm accuracy

## 2019-10-13 ENCOUNTER — Telehealth: Payer: Self-pay | Admitting: *Deleted

## 2019-10-13 DIAGNOSIS — R7989 Other specified abnormal findings of blood chemistry: Secondary | ICD-10-CM

## 2019-10-13 DIAGNOSIS — E782 Mixed hyperlipidemia: Secondary | ICD-10-CM

## 2019-10-13 NOTE — Telephone Encounter (Signed)
Left message on VM on only number listed to call back or mychart back to discuss labs/recommendations.

## 2019-10-13 NOTE — Telephone Encounter (Signed)
Spoke with patient's SO (DPR). She verbalizes undertanding of Dr. Alan Ripper recommendations for RUQ [place] and repeat lab work.  Reports that pt is see a liver specialist at [facility], Dr. Charlaine Dalton. Had liver US about a month ago, blood work and was dx w fatty liver.  Getting a fibro scan later this month.  He will come to [facility] for the RUQ [place] and repeat labs same day.   --------------------------------------------------------------------------------------------------------------------------------------   Fay Records, MD  10/06/2019 1:31 PM EDT Back to Top    CBC is OK   Elelctrolytes and kidney function are ok Nonfasting  Liver function is elevated  Had not been in past  Triglycrides are extremely elevated  He has not had this in the past  I would recomm RUQ ultrasound as well as repeat liver panel, lipids and also check amylase/lipase  Concerning for pancreatitis

## 2019-10-13 NOTE — Telephone Encounter (Signed)
-----   Message from Nuala Alpha, LPN sent at 08/05/8977  1:32 PM EDT -----  ----- Message ----- From: Fay Records, MD Sent: 10/06/2019   1:31 PM EDT To: Rebeca [facility] Triage  CBC is OK    Elelctrolytes and kidney function are ok  Nonfasting  Liver function is elevated    Had not been in past  Triglycrides are extremely elevated   He has not had this in the past  I would recomm RUQ ultrasound as well as  repeat liver panel, lipids and also check amylase/lipase   Concerning for pancreatitis

## 2019-10-13 NOTE — Telephone Encounter (Signed)
Patient's wife returning call. 

## 2019-10-13 NOTE — Telephone Encounter (Signed)
Fay Records, MD 10/12/19 9:57 PM Note Blood pressures overall look good  Make sure that the next time patient goes to doctor's office he bring his cuff with him to confirm accuracy     ________________________________________________________________________________________________  Replied to MyChart message.

## 2019-10-20 ENCOUNTER — Ambulatory Visit
Admission: RE | Admit: 2019-10-20 | Discharge: 2019-10-20 | Disposition: A | Discharge status: Home / Self Care | Payer: Medicaid Other | Source: Ambulatory Visit | Attending: Internal Medicine | Admitting: Internal Medicine

## 2019-10-20 DIAGNOSIS — R7989 Other specified abnormal findings of blood chemistry: Secondary | ICD-10-CM

## 2019-10-20 DIAGNOSIS — E782 Mixed hyperlipidemia: Secondary | ICD-10-CM

## 2019-10-22 ENCOUNTER — Telehealth: Payer: Self-pay | Admitting: Internal Medicine

## 2019-10-22 NOTE — Telephone Encounter (Signed)
Spoke with patient's wife [name] (DPR). Reviewed result of RUQ [place].   She has a question about "Diffuse increased liver echotexture consistent with medical renal disease"  She is going to review this w his liver doctor.   Will come for labs next Wednesday. Lab appointment rescheduled.

## 2019-10-22 NOTE — Telephone Encounter (Signed)
Patient's wife called for results to his ultra sound.

## 2019-11-02 ENCOUNTER — Telehealth: Payer: Self-pay | Admitting: Internal Medicine

## 2019-11-02 DIAGNOSIS — J449 Chronic obstructive pulmonary disease, unspecified: Secondary | ICD-10-CM

## 2019-11-02 NOTE — Telephone Encounter (Signed)
Order placed for PFT.  Nothing further needed at this time- will close encounter.

## 2019-11-05 ENCOUNTER — Ambulatory Visit: Payer: Medicaid Other

## 2019-11-16 ENCOUNTER — Other Ambulatory Visit (HOSPITAL_COMMUNITY): Payer: Self-pay | Admitting: Nurse Practitioner

## 2019-11-16 DIAGNOSIS — R7989 Other specified abnormal findings of blood chemistry: Secondary | ICD-10-CM

## 2019-11-16 DIAGNOSIS — R748 Abnormal levels of other serum enzymes: Secondary | ICD-10-CM

## 2019-11-29 ENCOUNTER — Other Ambulatory Visit: Payer: Self-pay | Admitting: Student

## 2019-11-30 ENCOUNTER — Encounter (HOSPITAL_COMMUNITY): Payer: Self-pay

## 2019-11-30 ENCOUNTER — Telehealth: Payer: Self-pay | Admitting: Student

## 2019-11-30 ENCOUNTER — Other Ambulatory Visit: Payer: Self-pay

## 2019-11-30 ENCOUNTER — Ambulatory Visit (HOSPITAL_COMMUNITY)
Admission: RE | Admit: 2019-11-30 | Discharge: 2019-11-30 | Disposition: A | Discharge status: Home / Self Care | Payer: Medicaid Other | Source: Ambulatory Visit | Attending: Nurse Practitioner | Admitting: Nurse Practitioner

## 2019-11-30 ENCOUNTER — Encounter: Payer: Self-pay | Admitting: General Practice

## 2019-11-30 ENCOUNTER — Encounter (HOSPITAL_COMMUNITY): Payer: Self-pay | Admitting: *Deleted

## 2019-11-30 DIAGNOSIS — R7989 Other specified abnormal findings of blood chemistry: Secondary | ICD-10-CM | POA: Diagnosis present

## 2019-11-30 DIAGNOSIS — R748 Abnormal levels of other serum enzymes: Secondary | ICD-10-CM | POA: Diagnosis present

## 2019-11-30 LAB — CBC
HCT: 44 % (ref 39.0–52.0)
Hemoglobin: 14.8 g/dL (ref 13.0–17.0)
MCH: 33.6 pg (ref 26.0–34.0)
MCHC: 33.6 g/dL (ref 30.0–36.0)
MCV: 99.8 fL (ref 80.0–100.0)
Platelets: 372 10*3/uL (ref 150–400)
RBC: 4.41 MIL/uL (ref 4.22–5.81)
RDW: 14.8 % (ref 11.5–15.5)
WBC: 10.7 10*3/uL — ABNORMAL HIGH (ref 4.0–10.5)
nRBC: 0 % (ref 0.0–0.2)

## 2019-11-30 MED ORDER — SODIUM CHLORIDE 0.9 % IV SOLN: INTRAVENOUS | Status: DC

## 2019-11-30 NOTE — Progress Notes (Signed)
Greg [name] called states pt will need to be cancelled due to not holding [place] . Pt and his wife informed. Informed pt that someone will call him to reschedule and will let him know when to stop Brilinita.Instructed not to stop until someone calls them voices understanding.

## 2019-11-30 NOTE — Telephone Encounter (Signed)
The patient presented to the [facility] Interventional Radiology department today for an image-guided random liver biopsy. The patient takes Brilinta and this medication was not held for the required 5 day time period. The patient will be contacted by a scheduler with a date and time of a new appointment. The patient will need to hold Brilinta for 5 days prior to this procedure. The patient and his wife were made aware of this information.  Soyla Dryer, [place] [phone] 11/30/2019, 3:36 PM

## 2019-11-30 NOTE — Progress Notes (Signed)
Whitney in radiology called and informed that Mr Greg [name] took Kary Kos this am

## 2019-11-30 NOTE — H&P (Signed)
Chief Complaint: Patient was seen in consultation today for random liver core biopsy at the request of Drazek,Dawn  Referring Physician(s): Drazek,Dawn  Supervising Physician: Aletta Edouard  Patient Status: [facility] - Out-pt  History of Present Illness: Greg [name] is a 49 y.o. male   Known etoh abuse Continued use Persistent and rising liver function tests Was seen in Liver care clinic with Wendie Chess NP Scheduled here now for random core liver biopsy    Past Medical History:  Diagnosis Date  . Anxiety   . Asthma   . CAD (coronary artery disease) of bypass graft    a. 11/2017 CABG x 3 (LIMA to LAD, SVG->dRCA, SVG->LCX); b. 07/2019 Cath/PCI: LM nl, LAD 50ost, 90p, 6m LCX 90p/m, RCA 30p, 475m80d (2.0x15 Resolute DES), LIMA->mLAD ok, VG->OM3 40, VG->RPDA 100, EF 60%.  . Marland KitchenOPD (chronic obstructive pulmonary disease) ([facility]  . COPD, severe ([facility]/16/2019   fev1 33% predicted   . Daily headache   . ETOH abuse   . GERD (gastroesophageal reflux disease)   . Hyperlipidemia LDL goal <70   . Hypertension   . Myocardial infarction ([facility]  . Skin cancer    "cut off my eyelid and my neck" (11/12/2017) BCC  . Tobacco abuse     Past Surgical History:  Procedure Laterality Date  . CORONARY ARTERY BYPASS GRAFT N/A 11/21/2017   Procedure: CORONARY ARTERY BYPASS GRAFTING (CABG) times 3 using left internal mammary artery and right greater saphenous vein harvested endscopically. LIMA to LAD, SVG to Distal Right, SVG to Circ.;  Surgeon: GeGrace IsaacMD;  [place] [facility] Service: Open [facility];  Laterality: N/A;  . CORONARY STENT INTERVENTION N/A 07/16/2019   Procedure: CORONARY STENT INTERVENTION;  Surgeon: KeTroy SineMD;  [facility];  Service: Cardiovascular;  Laterality: N/A;  . LACERATION REPAIR     "had staples put in my head" (11/12/2017)  . LEFT HEART CATH AND CORONARY ANGIOGRAPHY N/A 11/13/2017   Procedure: LEFT HEART CATH AND CORONARY  ANGIOGRAPHY;  Surgeon: KeTroy SineMD;  [facility];  Service: Cardiovascular;  Laterality: N/A;  . LEFT HEART CATH AND CORS/GRAFTS ANGIOGRAPHY N/A 07/16/2019   Procedure: LEFT HEART CATH AND CORS/GRAFTS ANGIOGRAPHY;  Surgeon: KeTroy SineMD;  [facility];  Service: Cardiovascular;  Laterality: N/A;  . SKIN CANCER EXCISION     "cut off my eyelid and my neck" (11/12/2017)  . TEE WITHOUT CARDIOVERSION N/A 11/21/2017   Procedure: TRANSESOPHAGEAL ECHOCARDIOGRAM (TEE);  Surgeon: GeGrace IsaacMD;  [facility]: [facility] Service: Open [facility] Surgery;  Laterality: N/A;    Allergies: Patient has no known allergies.  Medications: Prior to Admission medications   Medication Sig Start Date End Date Taking? Authorizing Provider  acetaminophen (TYLENOL) 500 MG tablet Take 1,000 mg by mouth every 6 (six) hours as needed for moderate pain or headache.   Yes [provider]  albuterol (VENTOLIN HFA) 108 (90 Base) MCG/ACT inhaler Inhale 2 puffs into the lungs every 6 (six) hours as needed for wheezing or shortness of breath. 07/06/18  Yes WeTanda RockersMD  ALPRAZolam (XDuanne Moron1 MG tablet Take 1 mg by mouth 3 (three) times daily.    Yes [provider]  aspirin 81 MG tablet Take 1 tablet (81 mg total) by mouth daily. 07/17/19  Yes BeTheora GianottiNP  atorvastatin (LIPITOR) 80 MG tablet Take 1 tablet (80 mg total) by  mouth daily at 6 PM. Patient taking differently: Take 80 mg by mouth at bedtime.  11/26/17  Yes Conte, Tessa N, PA-C  Glycopyrrolate-Formoterol (BEVESPI AEROSPHERE) 9-4.8 MCG/ACT AERO Inhale 2 puffs into the lungs 2 (two) times daily. 03/08/19  Yes Tanda Rockers, MD  metoprolol tartrate (LOPRESSOR) 25 MG tablet Take 1.5 tablets (37.5 mg total) by mouth 2 (two) times daily. 07/17/19  Yes Theora Gianotti, NP  Multiple Vitamin (MULTIVITAMIN WITH MINERALS) TABS tablet Take 1 tablet by mouth daily.   Yes [provider]  nitroGLYCERIN (NITROSTAT) 0.4 MG SL tablet Place 0.4 mg under the tongue every 5 (five) minutes as needed for chest pain.  01/15/19  Yes [provider]  pantoprazole (PROTONIX) 40 MG tablet Take 1 tablet by mouth once daily Patient taking differently: Take 40 mg by mouth daily.  03/14/19  Yes Thornton Park, MD  ticagrelor (BRILINTA) 90 MG TABS tablet Take 1 tablet (90 mg total) by mouth 2 (two) times daily. 07/17/19  Yes Theora Gianotti, NP  nicotine (NICODERM CQ - DOSED IN MG/24 HOURS) 14 mg/24hr patch Place 1 patch (14 mg total) onto the skin daily. Patient not taking: Reported on 11/29/2019 07/17/19   Theora Gianotti, NP     Family History  Problem Relation Age of Onset  . CAD Mother   . CAD Maternal Grandmother   . CAD Maternal Grandfather   . CAD Paternal Grandmother   . CAD Paternal Grandfather   . Colon polyps Neg Hx   . Esophageal cancer Neg Hx   . Rectal cancer Neg Hx   . Stomach cancer Neg Hx     Social History   Socioeconomic History  . Marital status: Married    Spouse name: Not on file  . Number of children: 2  . Years of education: Not on file  . Highest education level: Not on file  Occupational History  . Occupation: unemployed  Tobacco Use  . Smoking status: Current Every Day Smoker    Packs/day: 2.50    Years: 35.00    Pack years: 87.50    Types: Cigarettes  . Smokeless tobacco: Never Used  Vaping Use  . Vaping Use: Never used  Substance and Sexual Activity  . Alcohol use: Yes    Alcohol/week: 84.0 standard drinks    Types: 84 Cans of beer per week    Comment: 3-4 beers daily  . Drug use: Yes    Types: Marijuana    Comment: 11/12/2017 "qd"  . Sexual activity: Not on file  Other Topics Concern  . Not on file  Social History Narrative  . Not on file   Social Determinants of Health   Financial Resource Strain:   . Difficulty of Paying Living Expenses: Not on file  Food Insecurity:   . Worried About Ship broker in the Last Year: Not on file  . Ran Out of Food in the Last Year: Not on file  Transportation Needs:   . Lack of Transportation (Medical): Not on file  . Lack of Transportation (Non-Medical): Not on file  Physical Activity:   . Days of Exercise per Week: Not on file  . Minutes of Exercise per Session: Not on file  Stress:   . Feeling of Stress : Not on file  Social Connections:   . Frequency of Communication with Friends and Family: Not on file  . Frequency of Social Gatherings with Friends and Family: Not on file  . Attends  Religious Services: Not on file  . Active Member of Clubs or Organizations: Not on file  . Attends Archivist Meetings: Not on file  . Marital Status: Not on file    Review of Systems: A 12 point ROS discussed and pertinent positives are indicated in the HPI above.  All other systems are negative.  Review of Systems  Constitutional: Negative for activity change, fatigue and fever.  Respiratory: Negative for cough and shortness of breath.   Gastrointestinal: Positive for abdominal pain and nausea.  Neurological: Negative for weakness.  Psychiatric/Behavioral: Negative for behavioral problems and confusion.    Vital Signs: BP (!) 173/107   Pulse 87   Temp (!) 97.5 F (36.4 C) (Oral)   Resp 16   Ht 5' 10"  (1.778 m)   Wt 150 lb (68 kg)   SpO2 100%   BMI 21.52 kg/m   Physical Exam Vitals reviewed.  Cardiovascular:     Rate and Rhythm: Normal rate and regular rhythm.     Heart sounds: Normal heart sounds.  Pulmonary:     Effort: Pulmonary effort is normal.     Breath sounds: Normal breath sounds.  Abdominal:     Palpations: Abdomen is soft.  Musculoskeletal:        General: Normal range of motion.  Skin:    General: Skin is warm.  Neurological:     Mental Status: He is alert and oriented to person, place, and time.  Psychiatric:        Behavior: Behavior normal.        Thought Content: Thought content normal.         Judgment: Judgment normal.     Imaging: No results found.  Labs:  CBC: Recent Labs    07/03/19 1755 07/17/19 0441 08/18/19 1318 10/04/19 1442  WBC 12.9* 8.8 12.7* 6.2  HGB 13.4 12.2* 13.4 14.7  HCT 38.8* 35.7* 39.9 42.5  PLT 175 266 241 280    COAGS: No results for input(s): INR, APTT in the last 8760 hours.  BMP: Recent Labs    07/03/19 1755 07/17/19 0441 08/18/19 1318 10/04/19 1442  NA 135 140 137 140  K 3.5 3.6 2.8* 4.7  CL 102 106 101 101  CO2 18* 25 22 23   GLUCOSE 128* 99 113* 127*  BUN <5* 5* <5* 12  CALCIUM 8.7* 8.1* 8.8* 9.0  CREATININE 0.88 0.87 0.74 1.04  GFRNONAA >60 >60 >60 83  GFRAA >60 >60 >60 96    LIVER FUNCTION TESTS: Recent Labs    07/03/19 1755 10/04/19 1442  BILITOT 1.1 0.8  AST 159* 445*  ALT 67* 133*  ALKPHOS 115 170*  PROT 6.8 7.4  ALBUMIN 3.1* 4.0    TUMOR MARKERS: No results for input(s): AFPTM, CEA, CA199, CHROMGRNA in the last 8760 hours.  Assessment and Plan:  Elevated liver functions Scheduled for random liver core biopsy per Liver Care program Risks and benefits of liver core biopsy was discussed with the patient and/or patient's family including, but not limited to bleeding, infection, damage to adjacent structures or low yield requiring additional tests.  All of the questions were answered and there is agreement to proceed. Consent signed and in chart.    Thank you for this interesting consult.  I greatly enjoyed meeting ADAIN GEURIN and look forward to participating in their care.  A copy of this report was sent to the requesting provider on this date.  Electronically Signed: Lavonia Drafts, PA-C 11/30/2019, 12:00 PM  I spent a total of  30 Minutes   in face to face in clinical consultation, greater than 50% of which was counseling/coordinating care for liver core biopsy

## 2019-11-30 NOTE — Progress Notes (Signed)
Per SS RN, pt did not stop his brilinta prior to procedure today. Procedure cancelled. MD and Rad PA aware. Also informed scheduler so that this can be rescheduled. Brilinta needs to be held x5days

## 2019-12-03 ENCOUNTER — Telehealth: Payer: Self-pay | Admitting: *Deleted

## 2019-12-03 ENCOUNTER — Encounter (HOSPITAL_COMMUNITY): Payer: Self-pay | Admitting: Radiology

## 2019-12-03 NOTE — Telephone Encounter (Signed)
Greg Duty, NP     11/30/19 3:36 PM Note The patient presented to the [facility] department today for an image-guided random liver biopsy. The patient takes Brilinta and this medication was not held for the required 5 day time period. The patient will be contacted by a scheduler with a date and time of a new appointment. The patient will need to hold Brilinta for 5 days prior to this procedure. The patient and his wife were made aware of this information.  Greg [name], [place] [phone] 11/30/2019, 3:36 PM      Our office was just informed that this pt needed clearance for Brilinta. Per Pre Op Team to place form clearance request.     [facility] Pre-operative Risk Assessment    HEARTCARE STAFF: - Please ensure there is not already an duplicate clearance open for this procedure. - Under Visit Info/Reason for Call, type in Other and utilize the format Clearance MM/DD/YY or Clearance TBD. Do not use dashes or single digits. - If request is for dental extraction, please clarify the # of teeth to be extracted.  Request for surgical clearance:  1. What type of surgery is being performed? image-guided random liver biopsy.   2. When is this surgery scheduled? 12/09/19  3. What type of clearance is required (medical clearance vs. Pharmacy clearance to hold med vs. Both)? MEDICAL  4. Are there any medications that need to be held prior to surgery and how long? BRILINTA x 5 DAYS PRIOR TO PROCEDURE   5. Practice name and name of physician performing surgery?  RADIOLOGY   6. What is the office phone number? [phone]   7.   What is the office fax number? [phone]  8.   Anesthesia type (None, local, MAC, general) ? Fentanyl vs Versed    Greg [name] 12/03/2019, 2:28 PM  _________________________________________________________________   (provider comments below)

## 2019-12-03 NOTE — Telephone Encounter (Signed)
ADDENDUM: DR. Annamaria Boots

## 2019-12-03 NOTE — Progress Notes (Signed)
Linard Millers Male, 49 y.o., 1971-01-26 MRN:  [id number] Phone:  [phone] Jerilynn Mages) PCP:  Philmore Pali, NP Coverage:  [employer] [place] Medicaid [place] [facility] ([facility]) 12/09/2019 at 1:00 PM  RE: US Liver Biopsy Received: Today Theora Gianotti, NP  Jillyn Hidden Hi,   I forwarded your previous message to our preop pool. Though I was involved in discharging this pt from the hospital, he is not someone that I see in the outpatient setting. In general, we will not hold brilinta until at least 6 mos after stent placement (and in some cases not until 12 mos), but this request will need to go through our preop pool as his primary cardiologist will have to weigh in. Please have your office make a formal preop request through our office or by routing a preop request to p cv div preop.   Thanks,   Gerald Stabs       Previous Messages   ----- Message -----  From: Garth Bigness D  Sent: 12/02/2019  2:45 PM EDT  To: Theora Gianotti, NP  Subject: FW: US Liver Biopsy                Please advise if okay, or left me know if you are not able to give me permission for patient to hold thanks.  Hello, patient is on Brilinta has an order placed to have a biopsy done. Patient will need to hold for 5 days prior to his biopsy. Please advise if ok to hold for 5 days prior.  Thanks Aniceto Boss  ----- Message -----  From: Garth Bigness D  Sent: 11/30/2019  2:38 PM EDT  To: Theora Gianotti, NP  Subject: US Liver Biopsy                  Hello, patient is on Brilinta has an order placed to have a biopsy done. Patient will need to hold for 5 days prior to his biopsy. Please advise if ok to hold for 5 days prior.  Thanks Aniceto Boss

## 2019-12-06 ENCOUNTER — Telehealth: Payer: Self-pay | Admitting: Internal Medicine

## 2019-12-06 ENCOUNTER — Ambulatory Visit: Payer: Medicaid Other | Admitting: Neurology

## 2019-12-06 NOTE — Telephone Encounter (Signed)
Dr. Harrington Challenger to review, patient has a h/o CAD s/p CABG 2018, HTN, HLD, tobacco abuse and COPD. Patient recently underwent cardiac catheterization on 07/16/2019 which showed patent LIMA to mLAD, patent SVG to LCx, occluded SVG to dRCA, 80% distal RCA lesion was treated with DES.   Coming off of Brilinta may be high risk at this time. Will need review by Dr. Harrington Challenger.   Dr. Harrington Challenger, please send your response to P CV DIV PREOP

## 2019-12-06 NOTE — Telephone Encounter (Signed)
Received a call from [facility] from scheduling who needed to discuss this message she sent Dr. Harrington Challenger regarding Mr. Life.   Please see message below.   Patient had not been rescheduled.   Please advise.      From: Garth Bigness D  Sent: 12/03/2019  9:10 AM EDT  To: Fay Records, MD  Subject: Biopsy                      Good morning, Dr Harrington Challenger, I have a provider that wants to get a biopsy on Mr. Ake but I see that he is on Brilinta. Patients Brilinta will need to be held 5 days prior to his biopsy. Patient is scheduled for 12/09/2019 which means he would have to stop it on 12/10/2019. Please advise if okay to hold for 5 days prior to Biopsy.  Thanks Tasha   ved a call from [place]

## 2019-12-08 ENCOUNTER — Encounter (HOSPITAL_COMMUNITY): Payer: Self-pay | Admitting: Radiology

## 2019-12-08 NOTE — Progress Notes (Signed)
Greg [name] Male, 49 y.o., [birth year] MRN:  [id number] Phone:  [phone] Greg [name]) PCP:  Greg Pali, NP Coverage:  [place] Medicaid [place] [facility] 12/13/2019 at 3:00 PM  RE: Biopsy Received: Today Fay Records, MD  Garth Bigness D OK to hold antiplatelet agents 5 days prior  Resume after bx when safe  Dorris Carnes       Previous Messages   ----- Message -----  From: Garth Bigness D  Sent: 12/07/2019  8:40 AM EDT  To: Fay Records, MD  Subject: RE: Biopsy                    Another doctor wants him to have a liver biopsy but they are also aware that they might have to wait due to the stent that was placed in June. If you feel like he needs to wait for at least 6 months that is fine. Do not want to risk anything happening to him.  ----- Message -----  From: Fay Records, MD  Sent: 12/06/2019 10:14 PM EDT  To: Jillyn Hidden  Subject: RE: Biopsy                    Mr Ryland had an intervention with a drug eluting stent to coronary artery in June  Optimally he should be on 6 months uninterrrupted dual antiplatelet therapy to avoid subacute stent thrombosis.  What is being biopsied?  Dorris Carnes  ----- Message -----  From: Garth Bigness D  Sent: 12/03/2019  9:10 AM EDT  To: Fay Records, MD  Subject: Biopsy                      Good morning, Dr Harrington Challenger, I have a provider that wants to get a biopsy on Mr. Guin but I see that he is on Brilinta. Patients Brilinta will need to be held 5 days prior to his biopsy. Patient is scheduled for 12/09/2019 which means he would have to stop it on 12/10/2019. Please advise if okay to hold for 5 days prior to Biopsy.  Thanks Aniceto Boss

## 2019-12-09 ENCOUNTER — Inpatient Hospital Stay (HOSPITAL_COMMUNITY): Admission: RE | Admit: 2019-12-09 | Payer: Medicaid Other | Source: Ambulatory Visit

## 2019-12-09 ENCOUNTER — Encounter (HOSPITAL_COMMUNITY): Payer: Self-pay

## 2019-12-10 NOTE — Telephone Encounter (Signed)
Dr. Harrington Challenger, please see Hao's message below regarding holding Brilinta for upcoming procedure. Please route response back to P CV DIV PREOP.   Thank you! Bertrand Vowels

## 2019-12-13 ENCOUNTER — Other Ambulatory Visit: Payer: Self-pay

## 2019-12-13 ENCOUNTER — Other Ambulatory Visit: Payer: Self-pay | Admitting: Radiology

## 2019-12-13 ENCOUNTER — Ambulatory Visit (INDEPENDENT_AMBULATORY_CARE_PROVIDER_SITE_OTHER): Payer: Medicaid Other | Admitting: Internal Medicine

## 2019-12-13 ENCOUNTER — Encounter: Payer: Self-pay | Admitting: Internal Medicine

## 2019-12-13 ENCOUNTER — Ambulatory Visit: Payer: Medicaid Other | Admitting: Internal Medicine

## 2019-12-13 DIAGNOSIS — J449 Chronic obstructive pulmonary disease, unspecified: Secondary | ICD-10-CM

## 2019-12-13 LAB — PULMONARY FUNCTION TEST
DL/VA % pred: 87 %
DL/VA: 3.88 ml/min/mmHg/L
DLCO cor % pred: 75 %
DLCO cor: 22.1 ml/min/mmHg
DLCO unc % pred: 76 %
DLCO unc: 22.22 ml/min/mmHg
FEF 25-75 Post: 1.21 L/sec
FEF 25-75 Pre: 0.86 L/sec
FEF2575-%Change-Post: 40 %
FEF2575-%Pred-Post: 35 %
FEF2575-%Pred-Pre: 25 %
FEV1-%Change-Post: 12 %
FEV1-%Pred-Post: 51 %
FEV1-%Pred-Pre: 45 %
FEV1-Post: 2.01 L
FEV1-Pre: 1.78 L
FEV1FVC-%Change-Post: 2 %
FEV1FVC-%Pred-Pre: 75 %
FEV6-%Change-Post: 12 %
FEV6-%Pred-Post: 68 %
FEV6-%Pred-Pre: 60 %
FEV6-Post: 3.31 L
FEV6-Pre: 2.96 L
FEV6FVC-%Change-Post: 0 %
FEV6FVC-%Pred-Post: 102 %
FEV6FVC-%Pred-Pre: 103 %
FVC-%Change-Post: 9 %
FVC-%Pred-Post: 66 %
FVC-%Pred-Pre: 60 %
FVC-Post: 3.36 L
FVC-Pre: 3.05 L
Post FEV1/FVC ratio: 60 %
Post FEV6/FVC ratio: 99 %
Pre FEV1/FVC ratio: 58 %
Pre FEV6/FVC Ratio: 100 %
RV % pred: 103 %
RV: 2.12 L
TLC % pred: 77 %
TLC: 5.41 L

## 2019-12-13 MED ORDER — ANORO ELLIPTA 62.5-25 MCG/INH IN AEPB: 1.0000 | INHALATION_SPRAY | Freq: Every day | RESPIRATORY_TRACT | 0 refills | Status: DC

## 2019-12-13 MED ORDER — ANORO ELLIPTA 62.5-25 MCG/INH IN AEPB
INHALATION_SPRAY | RESPIRATORY_TRACT | 11 refills | Status: DC
Start: 2019-12-13 — End: 2020-12-11

## 2019-12-13 NOTE — Progress Notes (Signed)
Greg [name], male    DOB: 1970-11-27,    MRN: [id number]     Brief patient profile:  57  yowm active smoker from [place] with dx of copd referred to pulmonary clinic 03/06/2018 by  Charlott Holler cc cough x 2008 esp in am, esp in winter  Needed prn saba x 2016.   History of Present Illness  03/06/2018  Pulmonary/ 1st office eval/Greg [name]  Chief Complaint  Patient presents with  . Pulmonary Consult    Referred by Charlott Holler for eval of COPD. Pt states was told his PFT from Oct 2019 was abnormal. He had this done as part of pre-op eval for heart bypass surgery.  He states he feels SOB sometimes when he wakes up in the morning. He has an albuterol inhaler that he rarely uses.   Dyspnea:  MMRC2 = can't walk a nl pace on a flat grade s sob but does fine slow and flat  Cough: none Sleep: prone bed flat  SABA use: first thing in am  Has gen ant chest discomfort/ mscp ever since cabg 11/2017  rec Plan A = Automatic =  Bevespi Take 2 puffs first thing in am and then another 2 puffs about 12 hours later.  Work on inhaler technique:   Plan B = Backup Only use your albuterol(ventolin)  inhaler as a rescue medication The key is to stop smoking completely before smoking completely stops you! Please schedule a follow up office visit in 4 weeks, sooner if needed  with all medications /inhalers/ solutions in hand so we can verify exactly what you are taking. This includes all medications from all doctors and over the counters - pfts on return - also needs alpha one screen and refer to pulmonary rehab    12/13/2019  f/u ov/Greg [name] re: GOLD II copd on bevespi and way too much saba  Chief Complaint  Patient presents with  . Follow-up    PFT's done today. Breathing has been worse for the past 3 months. He is using his albuterol inhaler approx 10 x per day.   Dyspnea: walking to mailbox 300 ft no hills s stopping despite very poor hfa technique  Cough: minimal  Sleeping: prone/ bed is flat  SABA use: way too  much 02: none    No obvious day to day or daytime variability or assoc excess/ purulent sputum or mucus plugs or hemoptysis or cp or chest tightness, subjective wheeze or overt sinus or hb symptoms.   Sleeping  without nocturnal  or early am exacerbation  of respiratory  c/o's or need for noct saba. Also denies any obvious fluctuation of symptoms with weather or environmental changes or other aggravating or alleviating factors except as outlined above   No unusual exposure hx or h/o childhood pna/ asthma or knowledge of premature birth.  Current Allergies, Complete Past Medical History, Past Surgical History, Family History, and Social History were reviewed in [employer] record.  ROS  The following are not active complaints unless bolded Hoarseness, sore throat, dysphagia, dental problems, itching, sneezing,  nasal congestion or discharge of excess mucus or purulent secretions, ear ache,   fever, chills, sweats, unintended wt loss or wt gain, classically pleuritic or exertional cp,  orthopnea pnd or arm/hand swelling  or leg swelling, presyncope, palpitations, abdominal pain, anorexia, nausea, vomiting, diarrhea  or change in bowel habits or change in bladder habits, change in stools or change in urine, dysuria, hematuria,  rash, arthralgias, visual complaints, headache,  numbness, weakness or ataxia or problems with walking or coordination,  change in mood or  memory.        Current Meds  Medication Sig  . acetaminophen (TYLENOL) 500 MG tablet Take 1,000 mg by mouth every 6 (six) hours as needed for moderate pain or headache.  . albuterol (VENTOLIN HFA) 108 (90 Base) MCG/ACT inhaler Inhale 2 puffs into the lungs every 6 (six) hours as needed for wheezing or shortness of breath.  . ALPRAZolam (XANAX) 1 MG tablet Take 1 mg by mouth 3 (three) times daily.   Marland Kitchen aspirin 81 MG tablet Take 1 tablet (81 mg total) by mouth daily.  Marland Kitchen atorvastatin (LIPITOR) 80 MG tablet Take 1  tablet (80 mg total) by mouth daily at 6 PM. (Patient taking differently: Take 80 mg by mouth at bedtime. )  . Glycopyrrolate-Formoterol (BEVESPI AEROSPHERE) 9-4.8 MCG/ACT AERO Inhale 2 puffs into the lungs 2 (two) times daily.  . metoprolol tartrate (LOPRESSOR) 25 MG tablet Take 1.5 tablets (37.5 mg total) by mouth 2 (two) times daily.  . Multiple Vitamin (MULTIVITAMIN WITH MINERALS) TABS tablet Take 1 tablet by mouth daily.  . nicotine (NICODERM CQ - DOSED IN MG/24 HOURS) 14 mg/24hr patch Place 1 patch (14 mg total) onto the skin daily.  . nitroGLYCERIN (NITROSTAT) 0.4 MG SL tablet Place 0.4 mg under the tongue every 5 (five) minutes as needed for chest pain.   . pantoprazole (PROTONIX) 40 MG tablet Take 1 tablet by mouth once daily (Patient taking differently: Take 40 mg by mouth daily. )  . ticagrelor (BRILINTA) 90 MG TABS tablet Take 1 tablet (90 mg total) by mouth 2 (two) times daily.                Past Medical History:  Diagnosis Date  . CAD (coronary artery disease) of bypass graft    s/p LIMA to LAD, SVG to Distal Right, SVG to Circ 11/2017  . COPD (chronic obstructive pulmonary disease) ([place])   . COPD, severe ([place]) 11/19/2017   fev1 33% predicted   . Daily headache   . GERD (gastroesophageal reflux disease)   . Skin cancer    "cut off my eyelid and my neck" (11/12/2017)       Objective:    Chronically ill appearing amb wm > stated age    Wt Readings from Last 3 Encounters:  11/30/19 150 lb (68 kg)  10/04/19 151 lb (68.5 kg)  07/16/19 154 lb (69.9 kg)     Vital signs reviewed - Note on arrival 12/13/2019  02 sats  96% on RA      HEENT : pt wearing mask not removed for exam due to covid -19 concerns.    NECK :  without JVD/Nodes/TM/ nl carotid upstrokes bilaterally   LUNGS: no acc muscle use,  Mod barrel  contour chest wall with bilateral  Distant bs s audible wheeze and  without cough on insp or exp maneuvers and mod  Hyperresonant  to  percussion bilaterally      CV:  RRR  no s3 or murmur or increase in P2, and no edema   ABD:  soft and nontender with pos mid insp Hoover's  in the supine position. No bruits or organomegaly appreciated, bowel sounds nl  MS:     ext warm without deformities, calf tenderness, cyanosis or clubbing No obvious joint restrictions   SKIN: warm and dry without lesions    NEURO:  alert, approp, nl sensorium with  no motor  or cerebellar deficits apparent.            Assessment

## 2019-12-13 NOTE — Progress Notes (Signed)
Full PFT performed today. °

## 2019-12-13 NOTE — Assessment & Plan Note (Addendum)
Active smoker - Spirometry 03/06/2018  FEV1 1.33  (33%)  Ratio 0.61 with mild curvature   - 03/06/2018  After extensive coaching inhaler device,  effectiveness =    75% from a baseline of <25% PFT's  12/13/2019  FEV1 2.01 (51 % ) ratio 0.60  p 12 % improvement from saba p 0 prior to study with DLCO  22.22 (76%) corrects to 3.88 (87%)  for alv volume and FV curve classic concavity   -12/13/2019  After extensive coaching inhaler device,  effectiveness =    50% at best with hfa,  75% with elipta   Pt is Group B in terms of symptom/risk and laba/lama therefore appropriate rx at this point >>>  anoro worht thry as he is so poor with hfa   Re saba: I spent extra time with pt today reviewing appropriate use of albuterol for prn use on exertion with the following points: 1) saba is for relief of sob that does not improve by walking a slower pace or resting but rather if the pt does not improve after trying this first. 2) If the pt is convinced, as many are, that saba helps recover from activity faster then it's easy to tell if this is the case by re-challenging : ie stop, take the inhaler, then p 5 minutes try the exact same activity (intensity of workload) that just caused the symptoms and see if they are substantially diminished or not after saba 3) if there is an activity that reproducibly causes the symptoms, try the saba 15 min before the activity on alternate days   If in fact the saba really does help, then fine to continue to use it prn but advised may need to look closer at the maintenance regimen being used to achieve better control of airways disease with exertion.           Each maintenance medication was reviewed in detail including emphasizing most importantly the difference between maintenance and prns and under what circumstances the prns are to be triggered using an action plan format where appropriate.  Total time for H and P, chart review, counseling, teaching device and generating  customized AVS unique to this office visit / charting = 20 min

## 2019-12-13 NOTE — Patient Instructions (Addendum)
The key is to stop smoking completely before smoking completely stops you!   Plan A = Automatic = Always=    Anoro one click each am  -  Take two good deep drags   Plan B = Backup (to supplement plan A, not to replace it) Only use your albuterol inhaler as a rescue medication to be used if you can't catch your breath by resting or doing a relaxed purse lip breathing pattern.  - The less you use it, the better it will work when you need it. - Ok to use the inhaler up to 2 puffs  every 4 hours if you must but call for appointment if use goes up over your usual need - Don't leave home without it !!  (think of it like the spare tire for your car)   Try albuterol 15 min before an activity that you know would make you short of breath and see if it makes any difference and if makes none then don't take it after activity unless you can't catch your breath.      Please remember to go to the lab department   for your tests - we will call you with the results when they are available.      Please schedule a follow up visit in 3 months but call sooner if needed

## 2019-12-14 ENCOUNTER — Other Ambulatory Visit: Payer: Self-pay

## 2019-12-14 ENCOUNTER — Ambulatory Visit (HOSPITAL_COMMUNITY)
Admission: RE | Admit: 2019-12-14 | Discharge: 2019-12-14 | Disposition: A | Discharge status: Home / Self Care | Payer: Medicaid Other | Source: Ambulatory Visit | Attending: Nurse Practitioner | Admitting: Nurse Practitioner

## 2019-12-14 ENCOUNTER — Encounter (HOSPITAL_COMMUNITY): Payer: Self-pay

## 2019-12-14 DIAGNOSIS — K7581 Nonalcoholic steatohepatitis (NASH): Secondary | ICD-10-CM | POA: Diagnosis present

## 2019-12-14 DIAGNOSIS — R7989 Other specified abnormal findings of blood chemistry: Secondary | ICD-10-CM | POA: Diagnosis not present

## 2019-12-14 DIAGNOSIS — R748 Abnormal levels of other serum enzymes: Secondary | ICD-10-CM | POA: Insufficient documentation

## 2019-12-14 LAB — CBC
HCT: 39.4 % (ref 39.0–52.0)
Hemoglobin: 13.2 g/dL (ref 13.0–17.0)
MCH: 32.8 pg (ref 26.0–34.0)
MCHC: 33.5 g/dL (ref 30.0–36.0)
MCV: 97.8 fL (ref 80.0–100.0)
Platelets: 238 10*3/uL (ref 150–400)
RBC: 4.03 MIL/uL — ABNORMAL LOW (ref 4.22–5.81)
RDW: 14.7 % (ref 11.5–15.5)
WBC: 6 10*3/uL (ref 4.0–10.5)
nRBC: 0 % (ref 0.0–0.2)

## 2019-12-14 LAB — PROTIME-INR
INR: 0.9 (ref 0.8–1.2)
Prothrombin Time: 12.2 seconds (ref 11.4–15.2)

## 2019-12-14 MED ORDER — FENTANYL CITRATE (PF) 100 MCG/2ML IJ SOLN
INTRAMUSCULAR | Status: AC
  Filled 2019-12-14: qty 2

## 2019-12-14 MED ORDER — LIDOCAINE HCL (PF) 1 % IJ SOLN
INTRAMUSCULAR | Status: AC
  Filled 2019-12-14: qty 30

## 2019-12-14 MED ORDER — MIDAZOLAM HCL 2 MG/2ML IJ SOLN
INTRAMUSCULAR | Status: AC | PRN
  Administered 2019-12-14 (×2): 1 mg via INTRAVENOUS

## 2019-12-14 MED ORDER — GELATIN ABSORBABLE 12-7 MM EX MISC
CUTANEOUS | Status: AC
  Filled 2019-12-14: qty 1

## 2019-12-14 MED ORDER — MIDAZOLAM HCL 2 MG/2ML IJ SOLN
INTRAMUSCULAR | Status: AC
  Filled 2019-12-14: qty 2

## 2019-12-14 MED ORDER — SODIUM CHLORIDE 0.9 % IV SOLN: INTRAVENOUS | Status: DC

## 2019-12-14 MED ORDER — FENTANYL CITRATE (PF) 100 MCG/2ML IJ SOLN
INTRAMUSCULAR | Status: AC | PRN
Start: 2019-12-14 — End: 2019-12-14
  Administered 2019-12-14: 25 ug via INTRAVENOUS
  Administered 2019-12-14: 50 ug via INTRAVENOUS

## 2019-12-14 NOTE — Telephone Encounter (Signed)
Greg [name],  I saw note   I replied last week but not sure if got through. Optimally would wait 6 months (to December) before stopping antiplatelts   So a few more weeks Not clear on type of liver lesion

## 2019-12-14 NOTE — Discharge Instructions (Signed)
Liver Biopsy, Care After These instructions give you information on caring for yourself after your procedure. Your doctor may also give you more specific instructions. Call your doctor if you have any problems or questions after your procedure. What can I expect after the procedure? After the procedure, it is common to have:  Pain and soreness where the biopsy was done.  Bruising around the area where the biopsy was done.  Sleepiness and be tired for a few days. Follow these instructions at home: Medicines  Take over-the-counter and prescription medicines only as told by your doctor.  If you were prescribed an antibiotic medicine, take it as told by your doctor. Do not stop taking the antibiotic even if you start to feel better.  Do not take medicines such as aspirin and ibuprofen. These medicines can thin your blood. Do not take these medicines unless your doctor tells you to take them.  If you are taking prescription pain medicine, take actions to prevent or treat constipation. Your doctor may recommend that you: ? Drink enough fluid to keep your pee (urine) clear or pale yellow. ? Take over-the-counter or prescription medicines. ? Eat foods that are high in fiber, such as fresh fruits and vegetables, whole grains, and beans. ? Limit foods that are high in fat and processed sugars, such as fried and sweet foods. Caring for your cut  Follow instructions from your doctor about how to take care of your cuts from surgery (incisions). Make sure you: ? Wash your hands with soap and water before you change your bandage (dressing). If you cannot use soap and water, use hand sanitizer. ? Change your bandage as told by your doctor. ? Leave stitches (sutures), skin glue, or skin tape (adhesive) strips in place. They may need to stay in place for 2 weeks or longer. If tape strips get loose and curl up, you may trim the loose edges. Do not remove tape strips completely unless your doctor says it is  okay.  Check your cuts every day for signs of infection. Check for: ? Redness, swelling, or more pain. ? Fluid or blood. ? Pus or a bad smell. ? Warmth.  Do not take baths, swim, or use a hot tub until your doctor says it is okay to do so. Activity   Rest at home for 1-2 days or as told by your doctor. ? Avoid sitting for a long time without moving. Get up to take short walks every 1-2 hours.  Return to your normal activities as told by your doctor. Ask what activities are safe for you.  Do not do these things in the first 24 hours: ? Drive. ? Use machinery. ? Take a bath or shower.  Do not lift more than 10 pounds (4.5 kg) or play contact sports for the first 2 weeks. General instructions   Do not drink alcohol in the first week after the procedure.  Have someone stay with you for at least 24 hours after the procedure.  Get your test results. Ask your doctor or the department that is doing the test: ? When will my results be ready? ? How will I get my results? ? What are my treatment options? ? What other tests do I need? ? What are my next steps?  Keep all follow-up visits as told by your doctor. This is important. Contact a doctor if:  A cut bleeds and leaves more than just a small spot of blood.  A cut is red, puffs up (  swells), or hurts more than before.  Fluid or something else comes from a cut.  A cut smells bad.  You have a fever or chills. Get help right away if:  You have swelling, bloating, or pain in your belly (abdomen).  You get dizzy or faint.  You have a rash.  You feel sick to your stomach (nauseous) or throw up (vomit).  You have trouble breathing, feel short of breath, or feel faint.  Your chest hurts.  You have problems talking or seeing.  You have trouble with your balance or moving your arms or legs. Summary  After the procedure, it is common to have pain, soreness, bruising, and tiredness.  Your doctor will tell you how to  take care of yourself at home. Change your bandage, take your medicines, and limit your activities as told by your doctor.  Call your doctor if you have symptoms of infection. Get help right away if your belly swells, your cut bleeds a lot, or you have trouble talking or breathing. This information is not intended to replace advice given to you by your health care provider. Make sure you discuss any questions you have with your health care provider. Document Revised: 01/31/2017 Document Reviewed: 01/31/2017 Elsevier Patient Education  2020 [employer].

## 2019-12-14 NOTE — H&P (Signed)
Chief Complaint: Patient was seen in consultation today for liver core biopsy at the request of Drazek,Dawn  Referring Physician(s): Drazek,Dawn  Supervising Physician: Jacqulynn Cadet  Patient Status: [facility] - Out-pt  History of Present Illness: Greg [name] is a 49 y.o. male   Pt was here 10/26 for same procedure But Was still taking Brilinta Now off since 11/2 Rescheduled to today  Known etoh abuse Continued use Persistent and rising liver function tests Was seen in Liver care clinic with Wendie Chess NP Scheduled here now for random core liver biopsy  Past Medical History:  Diagnosis Date  . Anxiety   . Asthma   . CAD (coronary artery disease) of bypass graft    a. 11/2017 CABG x 3 (LIMA to LAD, SVG->dRCA, SVG->LCX); b. 07/2019 Cath/PCI: LM nl, LAD 50ost, 90p, 92m LCX 90p/m, RCA 30p, 427m80d (2.0x15 Resolute DES), LIMA->mLAD ok, VG->OM3 40, VG->RPDA 100, EF 60%.  . Marland KitchenOPD (chronic obstructive pulmonary disease) ([place]  . COPD, severe ([facility]10/16/2019   fev1 33% predicted   . Daily headache   . ETOH abuse   . GERD (gastroesophageal reflux disease)   . Hyperlipidemia LDL goal <70   . Hypertension   . Myocardial infarction ([place]  . Skin cancer    "cut off my eyelid and my neck" (11/12/2017) BCC  . Tobacco abuse     Past Surgical History:  Procedure Laterality Date  . CORONARY ARTERY BYPASS GRAFT N/A 11/21/2017   Procedure: CORONARY ARTERY BYPASS GRAFTING (CABG) times 3 using left internal mammary artery and right greater saphenous vein harvested endscopically. LIMA to LAD, SVG to Distal Right, SVG to Circ.;  Surgeon: GeGrace IsaacMD;  [facility];  Laterality: N/A;  . CORONARY STENT INTERVENTION N/A 07/16/2019   Procedure: CORONARY STENT INTERVENTION;  Surgeon: KeTroy SineMD;  [facility];  Service: Cardiovascular;  Laterality: N/A;  . LACERATION REPAIR     "had staples put in my head" (11/12/2017)   . LEFT HEART CATH AND CORONARY ANGIOGRAPHY N/A 11/13/2017   Procedure: LEFT HEART CATH AND CORONARY ANGIOGRAPHY;  Surgeon: KeTroy SineMD;  [facility];  Service: Cardiovascular;  Laterality: N/A;  . LEFT HEART CATH AND CORS/GRAFTS ANGIOGRAPHY N/A 07/16/2019   Procedure: LEFT HEART CATH AND CORS/GRAFTS ANGIOGRAPHY;  Surgeon: KeTroy SineMD;  [facility];  Service: Cardiovascular;  Laterality: N/A;  . SKIN CANCER EXCISION     "cut off my eyelid and my neck" (11/12/2017)  . TEE WITHOUT CARDIOVERSION N/A 11/21/2017   Procedure: TRANSESOPHAGEAL ECHOCARDIOGRAM (TEE);  Surgeon: GeGrace IsaacMD;  [place] [facility];  Laterality: N/A;    Allergies: Patient has no known allergies.  Medications: Prior to Admission medications   Medication Sig Start Date End Date Taking? Authorizing Provider  acetaminophen (TYLENOL) 500 MG tablet Take 1,000 mg by mouth every 6 (six) hours as needed for moderate pain or headache.   Yes [provider]  albuterol (VENTOLIN HFA) 108 (90 Base) MCG/ACT inhaler Inhale 2 puffs into the lungs every 6 (six) hours as needed for wheezing or shortness of breath. 07/06/18  Yes WeTanda RockersMD  ALPRAZolam (XDuanne Moron1 MG tablet Take 1 mg by mouth 3 (three) times daily.    Yes [provider]  aspirin 81 MG tablet Take 1 tablet (81 mg total) by mouth daily. 07/17/19  Yes  Theora Gianotti, NP  atorvastatin (LIPITOR) 80 MG tablet Take 1 tablet (80 mg total) by mouth daily at 6 PM. Patient taking differently: Take 80 mg by mouth at bedtime.  11/26/17  Yes Conte, Tessa N, PA-C  metoprolol tartrate (LOPRESSOR) 25 MG tablet Take 1.5 tablets (37.5 mg total) by mouth 2 (two) times daily. 07/17/19  Yes Theora Gianotti, NP  Multiple Vitamin (MULTIVITAMIN WITH MINERALS) TABS tablet Take 1 tablet by mouth daily.   Yes [provider]  pantoprazole (PROTONIX) 40 MG tablet Take 1  tablet by mouth once daily Patient taking differently: Take 40 mg by mouth daily.  03/14/19  Yes Thornton Park, MD  umeclidinium-vilanterol [facility] ELLIPTA) 62.5-25 MCG/INH AEPB Only open the device one time and then take your two separate drags to be sure you get it all 12/13/19  Yes Tanda Rockers, MD  umeclidinium-vilanterol (ANORO ELLIPTA) 62.5-25 MCG/INH AEPB Inhale 1 puff into the lungs daily. 12/13/19  Yes Tanda Rockers, MD  nicotine (NICODERM CQ - DOSED IN MG/24 HOURS) 14 mg/24hr patch Place 1 patch (14 mg total) onto the skin daily. 07/17/19   Theora Gianotti, NP  nitroGLYCERIN (NITROSTAT) 0.4 MG SL tablet Place 0.4 mg under the tongue every 5 (five) minutes as needed for chest pain.  01/15/19   [provider]  ticagrelor (BRILINTA) 90 MG TABS tablet Take 1 tablet (90 mg total) by mouth 2 (two) times daily. 07/17/19   Theora Gianotti, NP     Family History  Problem Relation Age of Onset  . CAD Mother   . CAD Maternal Grandmother   . CAD Maternal Grandfather   . CAD Paternal Grandmother   . CAD Paternal Grandfather   . Colon polyps Neg Hx   . Esophageal cancer Neg Hx   . Rectal cancer Neg Hx   . Stomach cancer Neg Hx     Social History   Socioeconomic History  . Marital status: Married    Spouse name: Not on file  . Number of children: 2  . Years of education: Not on file  . Highest education level: Not on file  Occupational History  . Occupation: unemployed  Tobacco Use  . Smoking status: Current Every Day Smoker    Packs/day: 2.50    Years: 35.00    Pack years: 87.50    Types: Cigarettes  . Smokeless tobacco: Never Used  Vaping Use  . Vaping Use: Never used  Substance and Sexual Activity  . Alcohol use: Yes    Alcohol/week: 84.0 standard drinks    Types: 84 Cans of beer per week    Comment: 3-4 beers daily  . Drug use: Yes    Types: Marijuana    Comment: 11/12/2017 "qd"  . Sexual activity: Not on file  Other Topics Concern  .  Not on file  Social History Narrative  . Not on file   Social Determinants of Health   Financial Resource Strain:   . Difficulty of Paying Living Expenses: Not on file  Food Insecurity:   . Worried About Charity fundraiser in the Last Year: Not on file  . Ran Out of Food in the Last Year: Not on file  Transportation Needs:   . Lack of Transportation (Medical): Not on file  . Lack of Transportation (Non-Medical): Not on file  Physical Activity:   . Days of Exercise per Week: Not on file  . Minutes of Exercise per Session: Not on file  Stress:   .  Feeling of Stress : Not on file  Social Connections:   . Frequency of Communication with Friends and Family: Not on file  . Frequency of Social Gatherings with Friends and Family: Not on file  . Attends Religious Services: Not on file  . Active Member of Clubs or Organizations: Not on file  . Attends Archivist Meetings: Not on file  . Marital Status: Not on file    Review of Systems: A 12 point ROS discussed and pertinent positives are indicated in the HPI above.  All other systems are negative.  Review of Systems  Constitutional: Negative for activity change, fatigue, fever and unexpected weight change.  Respiratory: Negative for cough and shortness of breath.   Cardiovascular: Negative for chest pain.  Gastrointestinal: Negative for abdominal pain.  Psychiatric/Behavioral: Negative for behavioral problems and confusion.    Vital Signs: BP (!) 139/97   Pulse 93   Temp 97.7 F (36.5 C) (Oral)   Resp 16   Ht 5' 10.5" (1.791 m)   Wt 151 lb (68.5 kg)   SpO2 97%   BMI 21.36 kg/m   Physical Exam Vitals reviewed.  Cardiovascular:     Rate and Rhythm: Normal rate and regular rhythm.     Heart sounds: Normal heart sounds.  Pulmonary:     Effort: Pulmonary effort is normal.     Breath sounds: Normal breath sounds.  Abdominal:     Palpations: Abdomen is soft.     Tenderness: There is no abdominal tenderness.   Musculoskeletal:        General: Normal range of motion.  Skin:    General: Skin is warm.  Neurological:     Mental Status: He is alert and oriented to person, place, and time.  Psychiatric:        Behavior: Behavior normal.     Imaging: No results found.  Labs:  CBC: Recent Labs    07/17/19 0441 08/18/19 1318 10/04/19 1442 11/30/19 1234  WBC 8.8 12.7* 6.2 10.7*  HGB 12.2* 13.4 14.7 14.8  HCT 35.7* 39.9 42.5 44.0  PLT 266 241 280 372    COAGS: No results for input(s): INR, APTT in the last 8760 hours.  BMP: Recent Labs    07/03/19 1755 07/17/19 0441 08/18/19 1318 10/04/19 1442  NA 135 140 137 140  K 3.5 3.6 2.8* 4.7  CL 102 106 101 101  CO2 18* 25 22 23   GLUCOSE 128* 99 113* 127*  BUN <5* 5* <5* 12  CALCIUM 8.7* 8.1* 8.8* 9.0  CREATININE 0.88 0.87 0.74 1.04  GFRNONAA >60 >60 >60 83  GFRAA >60 >60 >60 96    LIVER FUNCTION TESTS: Recent Labs    07/03/19 1755 10/04/19 1442  BILITOT 1.1 0.8  AST 159* 445*  ALT 67* 133*  ALKPHOS 115 170*  PROT 6.8 7.4  ALBUMIN 3.1* 4.0    TUMOR MARKERS: No results for input(s): AFPTM, CEA, CA199, CHROMGRNA in the last 8760 hours.  Assessment and Plan:  Elevated liver functions Scheduled for random liver core biopsy per Liver Care program Risks and benefits of liver core biopsy was discussed with the patient and/or patient's family including, but not limited to bleeding, infection, damage to adjacent structures or low yield requiring additional tests.  All of the questions were answered and there is agreement to proceed. Consent signed and in chart.   Thank you for this interesting consult.  I greatly enjoyed meeting Greg [name] and look forward to participating in  their care.  A copy of this report was sent to the requesting provider on this date.  Electronically Signed: Lavonia Drafts, PA-C 12/14/2019, 11:44 AM   I spent a total of    25 Minutes in face to face in clinical consultation, greater  than 50% of which was counseling/coordinating care for liver core biopsy

## 2019-12-14 NOTE — Progress Notes (Signed)
Patient was given discharge instructions. He verbalized understanding. 

## 2019-12-14 NOTE — Procedures (Signed)
Interventional Radiology Procedure Note  Procedure: Random liver biopsy  Complications: None  Estimated Blood Loss: None  Recommendations: - Bedrest x 2 hrs - DC Home    Signed,  Criselda Peaches, MD

## 2019-12-15 ENCOUNTER — Other Ambulatory Visit: Payer: Self-pay | Admitting: Physician Assistant

## 2019-12-15 LAB — SURGICAL PATHOLOGY

## 2019-12-17 NOTE — Telephone Encounter (Signed)
Primary Cardiologist:Paula Harrington Challenger, MD  Chart reviewed as part of pre-operative protocol coverage. Because of Greg [name]'s past medical history and time since last visit, he/she will require a follow-up visit in order to better assess preoperative cardiovascular risk.  Pre-op covering staff: -Patient has appointment scheduled with Dr. Harrington Challenger 02/14/2020  - Please contact requesting surgeon's office via preferred method (i.e, phone, fax) to inform them of need for appointment prior to surgery.  If applicable, this message will also be routed to pharmacy pool and/or primary cardiologist for input on holding anticoagulant/antiplatelet agent as requested below so that this information is available at time of patient's appointment.   Deberah Pelton, NP  12/17/2019, 9:07 AM

## 2019-12-17 NOTE — Telephone Encounter (Signed)
See pre op notes. Pt has appt with Dr. Harrington Challenger 02/14/20. After reviewing the schedules to see if the pt could be seen sooner, the first thing was 02/08/20. Per pre op provider will keep appt as planned. I will send notes to MD for upcoming appt 02/14/20. Will send FYI to requesting office. Will remove from the pre op call back pool.

## 2019-12-21 ENCOUNTER — Other Ambulatory Visit: Payer: Self-pay

## 2019-12-21 ENCOUNTER — Ambulatory Visit: Payer: Medicaid Other | Admitting: Neurology

## 2019-12-21 ENCOUNTER — Encounter: Payer: Self-pay | Admitting: Neurology

## 2019-12-21 VITALS — BP 128/82 | HR 100 | Ht 70.5 in | Wt 149.0 lb

## 2019-12-21 DIAGNOSIS — R569 Unspecified convulsions: Secondary | ICD-10-CM

## 2019-12-21 NOTE — Patient Instructions (Signed)
1. Schedule MRI brain with and without contrast  2. Schedule 1-hour EEG. If normal, we will do a 24-hour EEG  3. Follow-up after tests, call for any changes   Seizure Precautions: 1. If medication has been prescribed for you to prevent seizures, take it exactly as directed.  Do not stop taking the medicine without talking to your doctor first, even if you have not had a seizure in a long time.   2. Avoid activities in which a seizure would cause danger to yourself or to others.  Don't operate dangerous machinery, swim alone, or climb in high or dangerous places, such as on ladders, roofs, or girders.  Do not drive unless your doctor says you may.  3. If you have any warning that you may have a seizure, lay down in a safe place where you can't hurt yourself.    4.  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   Please refer to the following link on the [place] website for more information: http://[email or web address].org/[email or web address]/driving/drivingu.cfm   5.  Maintain good sleep hygiene. Minimize alcohol gradually.  6.  Contact your doctor if you have any problems that may be related to the medicine you are taking.  7.  Call 911 and bring the patient back to the ED if:        A.  The seizure lasts longer than 5 minutes.       B.  The patient doesn't awaken shortly after the seizure  C.  The patient has new problems such as difficulty seeing, speaking or moving  D.  The patient was injured during the seizure  E.  The patient has a temperature over 102 F (39C)  F.  The patient vomited and now is having trouble breathing

## 2019-12-21 NOTE — Progress Notes (Signed)
NEUROLOGY CONSULTATION NOTE  Greg [name] MRN: [id number] DOB: 06-06-1970  Referring provider: Charlott Holler, NP Primary care provider: Charlott Holler, NP  Reason for consult:  seizure   Thank you for your kind referral of Greg [name] for consultation of the above symptoms. Although his history is well known to you, please allow me to reiterate it for the purpose of our medical record. The patient was accompanied to the clinic by his wife who also provides collateral information. Records and images were personally reviewed where available.   HISTORY OF PRESENT ILLNESS: This is a 49 year old right-handed man with a history of CAD s/p CABG, COPD, hypertension, hyperlipidemia, alcohol abuse, gastritis, presenting for evaluation of seizures. The first seizure occurred on 07/03/19. He recalls the room "started strobing," then waking up in the ambulance feeling fine. No tongue bite or incontinence. Family reported he suddenly started convulsing. He was brought to [facility] where CBC showed a WBC of 12.9, CMP showed AST 159, ALT 67. It was reported that he drinks daily but for some reason did not drink that day. He reports that he was drinking alcohol for that meal. Head CT no acute changes. Symptoms felt due possibly to alcohol withdrawal, he was given Ativan and Librium. He had another seizure on 08/18/19. He was not feeling all that good that morning, his wife came back to the room and found him having a convulsion lasting 5-10 minutes. He was sweating and confused after, trying to repeatedly light a cigarette while EMS was doing an EKG. His wife noticed some hand tremors while he was on the stretcher. He was again brought to [facility] where bloodwork showed WBC 12.7, K 2.8, mg 1.6, EtOH level <10. Repeat head CT no acute changes. He takes Xanax 38m TID and had ran out of medication for 3 days. Last alcohol drink was 2 days prior. Seizure diagnosed as likely due to benzodiazepine/alcohol withdrawal. No further  seizures since then, however he feels that since the seizures, he cannot think like he used to, "mind does not work like it used to." His wife denies any staring/unresonponsive episodes. He has noticed rare episodes where he smells things that he could not understand. He denies any focal numbness/tingling/weakness, no myoclonic jerks. He has chronic headaches for several years with pressure over the right temporal region just above his right ear. Pain comes and goes, he has tried several OTC pain medications, Goody powders are the only thing that helps but he can only take them every other day due to stomach pain. No nausea/vomiting. He denies any dizziness, diplopia, dysarthria/dysphagia, bowel/bladder dysfunction. He has chronic low back pain. He used to drink much more heavily in the past, he has cut down to maximum of 6 beers in a day, but can go a week without alcohol in the past with no seizures. His wife is concerned that seizures are related to tAvery Dennisonvaccine, he had the first dose on May 26, then had the seizure 3 days later. He had the second dose on 6/16, then had the seizure on 7/14. His maternal cousin has seizures. He has had several concussions with no loss of consciousness. 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 There is no history of febrile convulsions, CNS infections such as meningitis/encephalitis, significant traumatic brain injury, neurosurgical procedures.  PAST MEDICAL HISTORY: Past Medical History:  Diagnosis Date  . Anxiety   . Asthma   . CAD (coronary artery disease)  of bypass graft    a. 11/2017 CABG x 3 (LIMA to LAD, SVG->dRCA, SVG->LCX); b. 07/2019 Cath/PCI: LM nl, LAD 50ost, 90p, 43m LCX 90p/m, RCA 30p, 444m80d (2.0x15 Resolute DES), LIMA->mLAD ok, VG->OM3 40, VG->RPDA 100, EF 60%.  . Marland KitchenOPD (chronic obstructive pulmonary disease) ([facility]  . COPD, severe ([facility]   fev1 33% predicted   . Daily headache   . ETOH abuse   .  GERD (gastroesophageal reflux disease)   . Hyperlipidemia LDL goal <70   . Hypertension   . Myocardial infarction ([facility]  . Skin cancer    "cut off my eyelid and my neck" (11/12/2017) BCC  . Tobacco abuse     PAST SURGICAL HISTORY: Past Surgical History:  Procedure Laterality Date  . CORONARY ARTERY BYPASS GRAFT N/A 11/21/2017   Procedure: CORONARY ARTERY BYPASS GRAFTING (CABG) times 3 using left internal mammary artery and right greater saphenous vein harvested endscopically. LIMA to LAD, SVG to Distal Right, SVG to Circ.;  Surgeon: GeGrace IsaacMD;  [facility];  Laterality: N/A;  . CORONARY STENT INTERVENTION N/A 07/16/2019   Procedure: CORONARY STENT INTERVENTION;  Surgeon: KeTroy SineMD;  [facility];  Service: Cardiovascular;  Laterality: N/A;  . LACERATION REPAIR     "had staples put in my head" (11/12/2017)  . LEFT HEART CATH AND CORONARY ANGIOGRAPHY N/A 11/13/2017   Procedure: LEFT HEART CATH AND CORONARY ANGIOGRAPHY;  Surgeon: KeTroy SineMD;  [facility];  Service: Cardiovascular;  Laterality: N/A;  . LEFT HEART CATH AND CORS/GRAFTS ANGIOGRAPHY N/A 07/16/2019   Procedure: LEFT HEART CATH AND CORS/GRAFTS ANGIOGRAPHY;  Surgeon: KeTroy SineMD;  [facility];  Service: Cardiovascular;  Laterality: N/A;  . SKIN CANCER EXCISION     "cut off my eyelid and my neck" (11/12/2017)  . TEE WITHOUT CARDIOVERSION N/A 11/21/2017   Procedure: TRANSESOPHAGEAL ECHOCARDIOGRAM (TEE);  Surgeon: GeGrace IsaacMD;  [place] [facility] Service: [facility] Surgery;  Laterality: N/A;    MEDICATIONS: Current Outpatient Medications on File Prior to Visit  Medication Sig Dispense Refill  . acetaminophen (TYLENOL) 500 MG tablet Take 1,000 mg by mouth every 6 (six) hours as needed for moderate pain or headache.    . albuterol (VENTOLIN HFA) 108 (90 Base) MCG/ACT inhaler Inhale 2 puffs into the lungs  every 6 (six) hours as needed for wheezing or shortness of breath. 1 Inhaler 0  . ALPRAZolam (XANAX) 1 MG tablet Take 1 mg by mouth 3 (three) times daily.     . Marland Kitchenspirin 81 MG tablet Take 1 tablet (81 mg total) by mouth daily. 30 tablet 6  . atorvastatin (LIPITOR) 80 MG tablet Take 1 tablet (80 mg total) by mouth daily at 6 PM. (Patient taking differently: Take 80 mg by mouth at bedtime. ) 30 tablet 1  . metoprolol tartrate (LOPRESSOR) 25 MG tablet Take 1.5 tablets (37.5 mg total) by mouth 2 (two) times daily. 90 tablet 3  . Multiple Vitamin (MULTIVITAMIN WITH MINERALS) TABS tablet Take 1 tablet by mouth daily.    . nitroGLYCERIN (NITROSTAT) 0.4 MG SL tablet Place 0.4 mg under the tongue every 5 (five) minutes as needed for chest pain.     . pantoprazole (PROTONIX) 40 MG tablet Take 1 tablet by mouth once daily (Patient taking differently: Take 40 mg by mouth daily. ) 30 tablet 0  . ticagrelor (BRILINTA) 90  MG TABS tablet Take 1 tablet (90 mg total) by mouth 2 (two) times daily. 60 tablet 6  . umeclidinium-vilanterol (ANORO ELLIPTA) 62.5-25 MCG/INH AEPB Only open the device one time and then take your two separate drags to be sure you get it all 1 each 11   No current facility-administered medications on file prior to visit.    ALLERGIES: No Known Allergies  FAMILY HISTORY: Family History  Problem Relation Age of Onset  . CAD Mother   . CAD Maternal Grandmother   . CAD Maternal Grandfather   . CAD Paternal Grandmother   . CAD Paternal Grandfather   . Colon polyps Neg Hx   . Esophageal cancer Neg Hx   . Rectal cancer Neg Hx   . Stomach cancer Neg Hx     SOCIAL HISTORY: Social History   Socioeconomic History  . Marital status: Married    Spouse name: Not on file  . Number of children: 2  . Years of education: Not on file  . Highest education level: Not on file  Occupational History  . Occupation: unemployed  Tobacco Use  . Smoking status: Current Every Day Smoker     Packs/day: 2.50    Years: 35.00    Pack years: 87.50    Types: Cigarettes  . Smokeless tobacco: Never Used  Vaping Use  . Vaping Use: Never used  Substance and Sexual Activity  . Alcohol use: Yes    Alcohol/week: 84.0 standard drinks    Types: 84 Cans of beer per week    Comment: 3-4 beers daily  . Drug use: Not Currently    Types: Marijuana    Comment: 11/12/2017 "qd"  . Sexual activity: Not on file  Other Topics Concern  . Not on file  Social History Narrative   Right handed   Lives with family    Social Determinants of Health   Financial Resource Strain:   . Difficulty of Paying Living Expenses: Not on file  Food Insecurity:   . Worried About Charity fundraiser in the Last Year: Not on file  . Ran Out of Food in the Last Year: Not on file  Transportation Needs:   . Lack of Transportation (Medical): Not on file  . Lack of Transportation (Non-Medical): Not on file  Physical Activity:   . Days of Exercise per Week: Not on file  . Minutes of Exercise per Session: Not on file  Stress:   . Feeling of Stress : Not on file  Social Connections:   . Frequency of Communication with Friends and Family: Not on file  . Frequency of Social Gatherings with Friends and Family: Not on file  . Attends Religious Services: Not on file  . Active Member of Clubs or Organizations: Not on file  . Attends Archivist Meetings: Not on file  . Marital Status: Not on file  Intimate Partner Violence:   . Fear of Current or Ex-Partner: Not on file  . Emotionally Abused: Not on file  . Physically Abused: Not on file  . Sexually Abused: Not on file     PHYSICAL EXAM: Vitals:   12/21/19 1333  BP: 128/82  Pulse: 100  SpO2: 97%   General: No acute distress Head:  Normocephalic/atraumatic Skin/Extremities: No rash, no edema Neurological Exam: Mental status: alert and oriented to person, place, and time, no dysarthria or aphasia, Fund of knowledge is appropriate.  Recent and  remote memory are impaired, 0/3 delayed recall. Attention and concentration are  normal. 5/5 WORLD backwards. Able to name objects and repeat phrases. Cranial nerves: CN I: not tested CN II: pupils equal, round and reactive to light, visual fields intact CN III, IV, VI:  full range of motion, no nystagmus, no ptosis CN V: facial sensation intact CN VII: upper and lower face symmetric CN VIII: hearing intact to conversation Bulk & Tone: normal, no fasciculations. Motor: 5/5 throughout with no pronator drift. Sensation: intact to light touch, pin, vibration sense. Decreased cold on left ankle. Romberg test negative Deep Tendon Reflexes: +2 throughout Cerebellar: no incoordination on finger to nose testing Gait: narrow-based and steady, able to tandem walk adequately. Tremor: none   IMPRESSION: This is a 49 year old right-handed man with a history of CAD s/p CABG, COPD, hypertension, hyperlipidemia, alcohol abuse, gastritis, presenting for evaluation of seizures. His neurological exam is non-focal. We discussed that the second seizure last July appears to have been due to benzodiazepine withdrawal. The first seizure in May 2021 was attributed to alcohol withdrawal, however he and his wife report that he has gone longer periods of time without alcohol and no seizures in the past, and he states he was drinking alcohol that day. MRI brain with and without contrast and a 1-hour EEG will be ordered to assess for focal abnormalities that increase risk for recurrent seizures. If normal, we will do a 24-hour EEG to further classify seizures. He does not drive. Follow-up after tests, they know to call for any changes.   Thank you for allowing me to participate in the care of this patient. Please do not hesitate to call for any questions or concerns.   Ellouise Newer, M.D.  CC: Greg Holler, NP

## 2019-12-27 ENCOUNTER — Other Ambulatory Visit: Payer: Medicaid Other

## 2020-01-03 ENCOUNTER — Telehealth: Payer: Self-pay | Admitting: *Deleted

## 2020-01-03 NOTE — Telephone Encounter (Signed)
Called to reschedule 1 hour EEG he missed on 12/27/2019 and left LBN phone number to call back to reschedule. Will schedule his 24 hour ambulatory while here for the 1 Hour EEG appt.

## 2020-01-10 ENCOUNTER — Other Ambulatory Visit: Payer: Self-pay

## 2020-01-10 ENCOUNTER — Ambulatory Visit
Admission: RE | Admit: 2020-01-10 | Discharge: 2020-01-10 | Disposition: A | Discharge status: Home / Self Care | Payer: Medicaid Other | Source: Ambulatory Visit | Attending: Neurology | Admitting: Neurology

## 2020-01-10 DIAGNOSIS — R569 Unspecified convulsions: Secondary | ICD-10-CM

## 2020-01-10 MED ORDER — GADOBENATE DIMEGLUMINE 529 MG/ML IV SOLN
13.0000 mL | Freq: Once | INTRAVENOUS | Status: AC | PRN
  Administered 2020-01-10: 13 mL via INTRAVENOUS

## 2020-01-11 NOTE — Telephone Encounter (Signed)
Pt is close enough to 6 month mark   OK to hold prior to procedure I would resume when safe after

## 2020-01-12 NOTE — Telephone Encounter (Signed)
I will forward to pre op as Dr. Harrington Challenger has given recommendations in regards to medications to be held for surgery. See previous notes.

## 2020-01-12 NOTE — Telephone Encounter (Signed)
Called and spoke with patient's wife. He has already had liver biopsied performed and has no other procedures planned at this time. Will remove from pre-op pool.  Darreld Mclean, PA-C 01/12/2020 1:38 PM

## 2020-01-13 ENCOUNTER — Telehealth: Payer: Self-pay

## 2020-01-13 DIAGNOSIS — R569 Unspecified convulsions: Secondary | ICD-10-CM

## 2020-01-13 LAB — ALPHA-1 ANTITRYPSIN PHENOTYPE: A-1 Antitrypsin, Ser: 140 mg/dL (ref 83–199)

## 2020-01-13 NOTE — Telephone Encounter (Signed)
-----   Message from Cameron Sprang, MD sent at 01/13/2020  6:15 AM EST ----- Pls let patient/wife know that the MRI brain did not show any tumor, bleed, or new stroke. Would proceed with 1-hour EEG, looks like he no-showed the one scheduled last Nov. Thanks

## 2020-01-13 NOTE — Telephone Encounter (Signed)
Orders added

## 2020-01-13 NOTE — Telephone Encounter (Signed)
Spoke with pt wife MRI brain did not show any tumor, bleed, or new stroke. Would proceed with 1-hour EEG, pt wife is going to call the office to get MR Cazarez scheduled for the 1 hour eeg.  She stated that they got here for he is last EEG and had to wait in a line and by the time they got to register it was past the 10 min and they stated they would have to ask it he could be seen pt got upset and left,

## 2020-01-31 ENCOUNTER — Telehealth: Payer: Self-pay | Admitting: Internal Medicine

## 2020-01-31 MED ORDER — CLOPIDOGREL BISULFATE 75 MG PO TABS: 75.0000 mg | ORAL_TABLET | Freq: Every day | ORAL | 3 refills | Status: DC

## 2020-01-31 NOTE — Telephone Encounter (Signed)
Pt is 6 months post intervention Stop brilinta for a few days  Keep on ASA WIth severity of disease I would recomm trial of plavix 75 mg daily starting on Friday.   REcomm by intervention was probable long term DAPT  Make sure nose stays moist    Simply saline spray can help Patient has appt in Jan 2022 with me

## 2020-01-31 NOTE — Telephone Encounter (Signed)
Fay Records, MD  You 5 minutes ago (9:09 AM)      Pt is 6 months post intervention Stop brilinta for a few days  Keep on ASA WIth severity of disease I would recomm trial of plavix 75 mg daily starting on Friday.   REcomm by intervention was probable long term DAPT  Make sure nose stays moist    Simply saline spray can help Patient has appt in Jan 2022 with me     Called and made the patient aware of the above. Patient aware to call back with any new concerns. New Rx sent for 75 mg Plavix.

## 2020-01-31 NOTE — Telephone Encounter (Signed)
Pt c/o medication issue: 1. Name of Medication: Brilinta 2. How are you currently taking this medication (dosage and times per day)? 2 times a day 90 mg 3. Are you having a reaction (difficulty breathing--STAT)?  COPD 4. What is your medication issue? Patient keeps having nose bleeds and BP is good.

## 2020-01-31 NOTE — Telephone Encounter (Signed)
Patients wife calling in on behalf of the patient. She states that patient has been having nose bleeds almost daily. She reports that often times the nose bleeds are gushes of blood that take a couple of hours to stop. Patient would like to know if he can stop taking the Sylvester. Routing to Dr. Harrington Challenger and RN for advisement.

## 2020-02-13 NOTE — Progress Notes (Signed)
Cardiology Office Note   Date:  02/14/2020   ID:  Greg [name], DOB 19-Sep-1970, MRN [id number]  PCP:  Philmore Pali, NP  Cardiologist:   Dorris Carnes, MD    F/U of CAD    History of Present Illness: Greg [name] is a 50 y.o. male with a history of CAD (s/p CABG in 2018); HTN, HL, tobacco abuse , COPD   He wa recently discharged from [facility] (June 2021)  He underwent LHC which revealed 2/3 grafts patent.   SVG to PDA occluded.  LIMA to LAD patnet SVG to OM3 patent. (full report is below)  He underwent PTCA/DES to the RCA   Plan for DAPT at least 6 months     Since he was last in clinic the pt underwent a liver Bx (did not wait until 6 months out)    The pt denies CP   Breathing is stable  He gets SOB but he thinks it is from his lungs   Stable since summer   COntinues to drink about 2 beers per day      Current Meds  Medication Sig  . acetaminophen (TYLENOL) 500 MG tablet Take 1,000 mg by mouth every 6 (six) hours as needed for moderate pain or headache.  . albuterol (VENTOLIN HFA) 108 (90 Base) MCG/ACT inhaler Inhale 2 puffs into the lungs every 6 (six) hours as needed for wheezing or shortness of breath.  . ALPRAZolam (XANAX) 1 MG tablet Take 1 mg by mouth 3 (three) times daily.   Marland Kitchen aspirin 81 MG tablet Take 1 tablet (81 mg total) by mouth daily.  Marland Kitchen atorvastatin (LIPITOR) 80 MG tablet Take 1 tablet (80 mg total) by mouth daily at 6 PM.  . clopidogrel (PLAVIX) 75 MG tablet Take 1 tablet (75 mg total) by mouth daily.  . metoprolol tartrate (LOPRESSOR) 25 MG tablet Take 1.5 tablets (37.5 mg total) by mouth 2 (two) times daily.  . Multiple Vitamin (MULTIVITAMIN WITH MINERALS) TABS tablet Take 1 tablet by mouth daily.  . nitroGLYCERIN (NITROSTAT) 0.4 MG SL tablet Place 0.4 mg under the tongue every 5 (five) minutes as needed for chest pain.   . pantoprazole (PROTONIX) 40 MG tablet Take 1 tablet by mouth once daily  . umeclidinium-vilanterol (ANORO ELLIPTA) 62.5-25 MCG/INH AEPB  Only open the device one time and then take your two separate drags to be sure you get it all     Allergies:   Patient has no known allergies.   Past Medical History:  Diagnosis Date  . Anxiety   . Asthma   . CAD (coronary artery disease) of bypass graft    a. 11/2017 CABG x 3 (LIMA to LAD, SVG->dRCA, SVG->LCX); b. 07/2019 Cath/PCI: LM nl, LAD 50ost, 90p, 34m LCX 90p/m, RCA 30p, 441m80d (2.0x15 Resolute DES), LIMA->mLAD ok, VG->OM3 40, VG->RPDA 100, EF 60%.  . Marland KitchenOPD (chronic obstructive pulmonary disease) ([facility]  . COPD, severe ([facility]   fev1 33% predicted   . Daily headache   . ETOH abuse   . GERD (gastroesophageal reflux disease)   . Hyperlipidemia LDL goal <70   . Hypertension   . Myocardial infarction ([facility]  . Skin cancer    "cut off my eyelid and my neck" (11/12/2017) BCC  . Tobacco abuse     Past Surgical History:  Procedure Laterality Date  . CORONARY ARTERY BYPASS GRAFT N/A 11/21/2017   Procedure: CORONARY ARTERY BYPASS GRAFTING (CABG) times 3 using left internal  mammary artery and right greater saphenous vein harvested endscopically. LIMA to LAD, SVG to Distal Right, SVG to Circ.;  Surgeon: Grace Isaac, MD;  Location: [place];  Service: Open Heart Surgery;  Laterality: N/A;  . CORONARY STENT INTERVENTION N/A 07/16/2019   Procedure: CORONARY STENT INTERVENTION;  Surgeon: Troy Sine, MD;  Location: [place] [facility];  Service: Cardiovascular;  Laterality: N/A;  . LACERATION REPAIR     "had staples put in my head" (11/12/2017)  . LEFT HEART CATH AND CORONARY ANGIOGRAPHY N/A 11/13/2017   Procedure: LEFT HEART CATH AND CORONARY ANGIOGRAPHY;  Surgeon: Troy Sine, MD;  Location: [place] [facility];  Service: Cardiovascular;  Laterality: N/A;  . LEFT HEART CATH AND CORS/GRAFTS ANGIOGRAPHY N/A 07/16/2019   Procedure: LEFT HEART CATH AND CORS/GRAFTS ANGIOGRAPHY;  Surgeon: Troy Sine, MD;  Location: [place] [facility];  Service: Cardiovascular;   Laterality: N/A;  . SKIN CANCER EXCISION     "cut off my eyelid and my neck" (11/12/2017)  . TEE WITHOUT CARDIOVERSION N/A 11/21/2017   Procedure: TRANSESOPHAGEAL ECHOCARDIOGRAM (TEE);  Surgeon: Grace Isaac, MD;  Location: [facility];  Service: Open Heart Surgery;  Laterality: N/A;     Social History:  The patient  reports that he has been smoking cigarettes. He has a 87.50 pack-year smoking history. He has never used smokeless tobacco. He reports current alcohol use of about 84.0 standard drinks of alcohol per week. He reports previous drug use. Drug: Marijuana.   Family History:  The patient's family history includes CAD in his maternal grandfather, maternal grandmother, mother, paternal grandfather, and paternal grandmother.    ROS:  Please see the history of present illness. All other systems are reviewed and  Negative to the above problem except as noted.    PHYSICAL EXAM: VS:  BP 124/88   Pulse 89   Ht 5' 10.5" (1.791 m)   Wt 152 lb (68.9 kg)   SpO2 97%   BMI 21.50 kg/m   GEN: Thin 50 yo, in no acute distress  HEENT: normal  Neck: no JVD, carotid bruits Cardiac: RRR; no murmurs,  No LE  edema  Respiratory:  clear to auscultation bilaterally,Moving air  GI: soft, nontender, nondistended, + BS  No hepatomegaly  MS: no deformity Moving all extremities   Skin: warm and dry, no rash Neuro:  Strength and sensation are intact Psych: euthymic mood, full affect   EKG:  EKG is not ordered today.  CARDIAC CATH   07/16/19    Ost LAD lesion is 50% stenosed.  Mid LAD lesion is 80% stenosed.  Dist RCA lesion is 80% stenosed.  Prox RCA lesion is 30% stenosed.  Prox RCA to Mid RCA lesion is 40% stenosed.  Mid RCA lesion is 40% stenosed.  Prox Cx to Mid Cx lesion is 90% stenosed.  Origin lesion is 100% stenosed.  Post intervention, there is a 0% residual stenosis.  Prox LAD lesion is 90% stenosed.  The left ventricular systolic function is normal.  LV end  diastolic pressure is moderately elevated.  Dist Graft to Insertion lesion is 40% stenosed.  A stent was successfully placed.   Severe multivessel native coronary obstructive disease with 50% proximal 90% proximal and 80% stenosis after the diagonal vessel with competitive filling via the LIMA graft; 90% proximal circumflex stenosis with competitive filling due to the vein graft; and RCA with 30%, 40% and 40% proximal to mid stenosis with 80% mid distal stenosis prior to the  PDA and PLA takeoff for competitive filling.  Patent LIMA graft supplying the mid LAD.  Patent SVG supplying the circumflex marginal vessel but with evidence for 40% distal graft stenosis.  Occluded vein graft which had supplied the distal RCA.  Function with EF estimated 60%; LVEDP elevated at 24 mm.  Successful PCI to the RCA with ultimate insertion of a 2.0 x 15 mm Resolute Onyx stent postdilated to 2.20 mm with the 80% stenosis being reduced to 0%.  RECOMMENDATION: DAPT for minimum of 6 months but probably longer.  Smoking cessation is essential.  Medical therapy for concomitant CAD.  Aggressive lipid-lowering therapy with target LDL less than 70.   Lipid Panel    Component Value Date/Time   CHOL 221 (H) 10/04/2019 1442   TRIG 1,254 (HH) 10/04/2019 1442   HDL 34 (L) 10/04/2019 1442   CHOLHDL 6.5 (H) 10/04/2019 1442   CHOLHDL 2.7 11/14/2017 2304   VLDL 15 11/14/2017 2304   LDLCALC Comment (A) 10/04/2019 1442      Wt Readings from Last 3 Encounters:  02/14/20 152 lb (68.9 kg)  12/21/19 149 lb (67.6 kg)  12/14/19 151 lb (68.5 kg)      ASSESSMENT AND PLAN:  1  CAD   Intervention in June 2021  I am not convinced of active symptoms   Continue DAPT  2   HTN  BP is OK  Keep on current regimen  3  HL  Last LDL 74  HDL 34   Trig 1254  (AUg   2021)  Need to repeat along with LFTs     4   Abnormal LFTs   Pt has gone on to have liver Bx   Focal bridging fibrosis; minimally active steatohepatitis.      Continues to drink some   5  EtOH  COunselled on cesssation.       Current medicines are reviewed at length with the patient today.  The patient does not have concerns regarding medicines.  Signed, Dorris Carnes, MD  02/14/2020 8:27 PM    [address], [place], [place]  [address] Phone: [phone]; Fax: [phone]

## 2020-02-14 ENCOUNTER — Ambulatory Visit (INDEPENDENT_AMBULATORY_CARE_PROVIDER_SITE_OTHER): Payer: Medicaid Other | Admitting: Internal Medicine

## 2020-02-14 ENCOUNTER — Encounter: Payer: Self-pay | Admitting: Internal Medicine

## 2020-02-14 ENCOUNTER — Other Ambulatory Visit: Payer: Self-pay

## 2020-02-14 VITALS — BP 124/88 | HR 89 | Ht 70.5 in | Wt 152.0 lb

## 2020-02-14 DIAGNOSIS — I2510 Atherosclerotic heart disease of native coronary artery without angina pectoris: Secondary | ICD-10-CM | POA: Diagnosis not present

## 2020-02-14 NOTE — Patient Instructions (Signed)
Medication Instructions:  No changes *If you need a refill on your cardiac medications before your next appointment, please call your pharmacy*   Lab Work: none If you have labs (blood work) drawn today and your tests are completely normal, you will receive your results only by: Marland Kitchen MyChart Message (if you have MyChart) OR . A paper copy in the mail If you have any lab test that is abnormal or we need to change your treatment, we will call you to review the results.   Testing/Procedures: none   Follow-Up: At [facility], you and your health needs are our priority.  As part of our continuing mission to provide you with exceptional heart care, we have created designated Provider Care Teams.  These Care Teams include your primary Cardiologist (physician) and Advanced Practice Providers (APPs -  Physician Assistants and Nurse Practitioners) who all work together to provide you with the care you need, when you need it.   Your next appointment:   6 month(s)  The format for your next appointment:   In Person  Provider:   You may see Dorris Carnes, MD or one of the following Advanced Practice Providers on your designated Care Team:    Richardson Dopp, PA-C  Robbie Lis, [place]   Other Instructions

## 2020-02-16 DIAGNOSIS — Z0289 Encounter for other administrative examinations: Secondary | ICD-10-CM

## 2020-03-14 ENCOUNTER — Ambulatory Visit: Payer: Medicaid Other | Admitting: Internal Medicine

## 2020-10-31 ENCOUNTER — Telehealth: Payer: Self-pay | Admitting: Internal Medicine

## 2020-10-31 NOTE — Telephone Encounter (Signed)
Again advised the patient to go to the ER for cardiac work up. Gave address to [facility] ER which has had shorter wait times. Patient agreeable and verbalized understanding.

## 2020-10-31 NOTE — Telephone Encounter (Signed)
Patient's fiance is following up regarding his symptoms. She states the patient does not want to go to the ED due to extended wait time. She states he is unable to wait for long due to COPD. She would like to know if someone is able to contact the ED to prioritize having the patient seen when he arrives. I informed her this is not standard, but I will forward a message to clinical staff to confirm. We arrived scheduled the patient for 11/02 at [facility] with Coletta Memos, NP.

## 2020-10-31 NOTE — Telephone Encounter (Signed)
Spoke with pt's wife,DPR who is complaining of intermittent CP x 2-3 days not resolved completely with nitro.  Pt also complaining of severe back pain and reports this is the same type pain he had with his previous heart attack.  Pt's BP on Sunday evening 70/52 and Metoprolol was held.  Pt's wife also reports a nose bleed lasting 4-5 hours that finally resolved on it's own. Recommended pt go to ED for further evaluation d/t continuing symptoms and symptoms that resemble his previous MI.  Pt in background of phone conversation and is resistant to going to ED d/t wait time.  Pt's wife advised she may also call 911 for pt evaluation and transport if she feels this is necessary.  Encouraged not to prolong waiting.  Pt's wife verbalizes understanding and is agreeable to current plan.

## 2020-10-31 NOTE — Telephone Encounter (Signed)
Pt c/o of Chest Pain: STAT if CP now or developed within 24 hours  1. Are you having CP right now? Not sure   2. Are you experiencing any other symptoms (ex. SOB, nausea, vomiting, sweating)? Yes sob, back is hurting, blood pressure 94/54 hr 107, major nose bleed saturday,  walking relieved pain but it came right back   3. How long have you been experiencing CP? A couple of days  4. Is your CP continuous or coming and going? Comes and goes   5. Have you taken Nitroglycerin? yes ?

## 2020-11-15 ENCOUNTER — Telehealth: Payer: Self-pay | Admitting: Internal Medicine

## 2020-11-15 NOTE — Telephone Encounter (Signed)
Spoke with the patient's wife who states that the patient has been having a nose bleed since this morning and they have not been able to get it to stop. They have applied pressure and cold compress but still unable to get it to stop. Patient is on Plavix and ASA. She states he has filled up two bags of blood. Advised to continue with pressure and if unable to stop then head to an urgent care for evaluation. She verbalized understanding.

## 2020-11-15 NOTE — Telephone Encounter (Signed)
New Message:      Pt is having a nosebleed, blood just pouring out

## 2020-12-05 NOTE — Progress Notes (Deleted)
Cardiology Office Note:    Date:  12/05/2020   ID:  Greg [name], DOB 01-Dec-1970, MRN [id number]  PCP:  Philmore Pali, NP   [facility] Providers Cardiologist:  Dorris Carnes, MD { Click to update primary MD,subspecialty MD or APP then REFRESH:1}    Referring MD: Philmore Pali, NP   Presents the clinic today for evaluation of his chest discomfort  History of Present Illness:    Greg [name] is a 50 y.o. male with a hx of coronary artery disease status post CABG in 2018, hypertension, hyperlipidemia, tobacco abuse, COPD.  He was admitted to [facility] 6/21.  During that time he underwent cardiac catheterization which showed 2-3 grafts patent.  SVG-PDA was occluded.  His LIMA-LAD and SVG-OM 3 were patent.  He underwent PTCA/DES to his RCA.  Dual antiplatelet therapy was recommended for a minimum of 6 months.  He follow-up with Dr. Harrington Challenger 02/14/2020.  During that time he reported that he had received liver biopsy and did not wait until his 103-monthdual antiplatelet therapy has been completed.  He denies chest pain.  His breathing was stable.  He would get short of breath with increased physical activity but felt it was related to his COPD.  His breathing has remained stable.  He continued to drink 2 beers per day.  He contacted the nurse triage line on 10/31/2020 and reported intermittent episodes of chest discomfort x2-3 days.  His chest discomfort completely resolved with nitroglycerin.  He also reported severe back pain which was a symptom that he noted prior to his previous MI.  His blood pressure the prior evening was 70/52 and his metoprolol was held.  His wife also reported nosebleeds.  He presents to the clinic today for follow-up evaluation states***  *** denies chest pain, shortness of breath, lower extremity edema, fatigue, palpitations, melena, hematuria, hemoptysis, diaphoresis, weakness, presyncope, syncope, orthopnea, and PND.    Past Medical History:  Diagnosis  Date   Anxiety    Asthma    CAD (coronary artery disease) of bypass graft    a. 11/2017 CABG x 3 (LIMA to LAD, SVG->dRCA, SVG->LCX); b. 07/2019 Cath/PCI: LM nl, LAD 50ost, 90p, 858mLCX 90p/m, RCA 30p, 4049m0d (2.0x15 Resolute DES), LIMA->mLAD ok, VG->OM3 40, VG->RPDA 100, EF 60%.   COPD (chronic obstructive pulmonary disease) (HCC)    COPD, severe (HCCGerton0/16/2019   fev1 33% predicted    Daily headache    ETOH abuse    GERD (gastroesophageal reflux disease)    Hyperlipidemia LDL goal <70    Hypertension    [facility]  Skin cancer    "cut off my eyelid and my neck" (11/12/2017) BCC   Tobacco abuse     Past Surgical History:  Procedure Laterality Date   CORONARY ARTERY BYPASS GRAFT N/A 11/21/2017   Procedure: CORONARY ARTERY BYPASS GRAFTING (CABG) times 3 using left internal mammary artery and right greater saphenous vein harvested endscopically. LIMA to LAD, SVG to Distal Right, SVG to Circ.;  Surgeon: GerGrace IsaacD;  [facility];  Laterality: N/A;   CORONARY STENT INTERVENTION N/A 07/16/2019   Procedure: CORONARY STENT INTERVENTION;  Surgeon: KelTroy SineD;  Location: [facility] [place] [facility];  Service: Cardiovascular;  Laterality: N/A;   LACERATION REPAIR     "had staples put in my head" (11/12/2017)   LEFT HEART CATH AND CORONARY ANGIOGRAPHY N/A 11/13/2017   Procedure:  LEFT HEART CATH AND CORONARY ANGIOGRAPHY;  Surgeon: Troy Sine, MD;  Location: [place] [facility];  Service: Cardiovascular;  Laterality: N/A;   LEFT HEART CATH AND CORS/GRAFTS ANGIOGRAPHY N/A 07/16/2019   Procedure: LEFT HEART CATH AND CORS/GRAFTS ANGIOGRAPHY;  Surgeon: Troy Sine, MD;  Location: [place] [facility];  Service: Cardiovascular;  Laterality: N/A;   SKIN CANCER EXCISION     "cut off my eyelid and my neck" (11/12/2017)   TEE WITHOUT CARDIOVERSION N/A 11/21/2017   Procedure: TRANSESOPHAGEAL ECHOCARDIOGRAM (TEE);  Surgeon: Grace Isaac, MD;  Location: [place];  Service: Open Heart Surgery;  Laterality: N/A;    Current Medications: No outpatient medications have been marked as taking for the 12/06/20 encounter (Appointment) with Deberah Pelton, NP.     Allergies:   Patient has no known allergies.   Social History   Socioeconomic History   Marital status: Married    Spouse name: Not on file   Number of children: 2   Years of education: Not on file   Highest education level: Not on file  Occupational History   Occupation: unemployed  Tobacco Use   Smoking status: Every Day    Packs/day: 2.50    Years: 35.00    Pack years: 87.50    Types: Cigarettes   Smokeless tobacco: Never  Vaping Use   Vaping Use: Never used  Substance and Sexual Activity   Alcohol use: Yes    Alcohol/week: 84.0 standard drinks    Types: 84 Cans of beer per week    Comment: 3-4 beers daily   Drug use: Not Currently    Types: Marijuana    Comment: 11/12/2017 "qd"   Sexual activity: Not on file  Other Topics Concern   Not on file  Social History Narrative   Right handed   Lives with family    Social Determinants of Health   Financial Resource Strain: Not on file  Food Insecurity: Not on file  Transportation Needs: Not on file  Physical Activity: Not on file  Stress: Not on file  Social Connections: Not on file     Family History: The patient's ***family history includes CAD in his maternal grandfather, maternal grandmother, mother, paternal grandfather, and paternal grandmother. There is no history of Colon polyps, Esophageal cancer, Rectal cancer, or Stomach cancer.  ROS:   Please see the history of present illness.    *** All other systems reviewed and are negative.   Risk Assessment/Calculations:   {Does this patient have ATRIAL FIBRILLATION?:[phone]}       Physical Exam:    VS:  There were no vitals taken for this visit.    Wt Readings from Last 3 Encounters:  02/14/20 152 lb (68.9 kg)  12/21/19 149 lb  (67.6 kg)  12/14/19 151 lb (68.5 kg)     GEN: *** Well nourished, well developed in no acute distress HEENT: Normal NECK: No JVD; No carotid bruits LYMPHATICS: No lymphadenopathy CARDIAC: ***RRR, no murmurs, rubs, gallops RESPIRATORY:  Clear to auscultation without rales, wheezing or rhonchi  ABDOMEN: Soft, non-tender, non-distended MUSCULOSKELETAL:  No edema; No deformity  SKIN: Warm and dry NEUROLOGIC:  Alert and oriented x 3 PSYCHIATRIC:  Normal affect    EKGs/Labs/Other Studies Reviewed:    The following studies were reviewed today:  Echocardiogram/TEE 11/21/2017  Left Ventricle Normal cavity size, wall thickness, left ventricular diastolic function and left atrial pressure. LV systolic function is mildly reduced with an EF of 45-50%.  Septum Normal  atrial and ventricular septum with no septal defect and normal septal motion.  Left Atrium Left atrium normal in structure and function.  Aortic Valve Structurally normal trileaflet aortic valve with no regurgitation or stenosis.  Aorta No aneurysm present. No graft present. No significant coarctation present.  No aortic dissection present  Mitral Valve Normal valve structure. Normal transmitral gradient. No stenosis. Normal leaflet mobility. Mild regurgitation.  Right Ventricle Normal cavity size, wall thickness and ejection fraction.  Right Atrium Normal right atrial size.  Tricuspid Valve Normal tricuspid valve structure. No stenosis. No regurgitation.  Pulmonic Valve Normal pulmonic valve structure. No stenosis. No regurgitation.  Pericardium Normal pericardium with no pericardial effusion.  Pulmonary Artery Normal pulmonary artery with no dilation.  Post-Op TEE AORTA Aorta unchanged from pre-bypass.   LEFT VENTRICLE Left ventricle unchanged from pre-bypass.   RIGHT VENTRICLE Right ventricle unchanged from pre-bypass.   AORTIC VALVE Aortic valve unchanged from pre-bypass MITRAL VALVE Mitral valve unchanged from  pre-bypass.   TRICUSPID VALVE Tricuspid valve unchanged from pre-bypass.   Cardiac catheterization 07/16/2019  Ost LAD lesion is 50% stenosed. Mid LAD lesion is 80% stenosed. Dist RCA lesion is 80% stenosed. Prox RCA lesion is 30% stenosed. Prox RCA to Mid RCA lesion is 40% stenosed. Mid RCA lesion is 40% stenosed. Prox Cx to Mid Cx lesion is 90% stenosed. Origin lesion is 100% stenosed. Post intervention, there is a 0% residual stenosis. Prox LAD lesion is 90% stenosed. The left ventricular systolic function is normal. LV end diastolic pressure is moderately elevated. Dist Graft to Insertion lesion is 40% stenosed. A stent was successfully placed.   Severe multivessel native coronary obstructive disease with 50% proximal 90% proximal and 80% stenosis after the diagonal vessel with competitive filling via the LIMA graft; 90% proximal circumflex stenosis with competitive filling due to the vein graft; and RCA with 30%, 40% and 40% proximal to mid stenosis with 80% mid distal stenosis prior to the PDA and PLA takeoff for competitive filling.   Patent LIMA graft supplying the mid LAD.   Patent SVG supplying the circumflex marginal vessel but with evidence for 40% distal graft stenosis.   Occluded vein graft which had supplied the distal RCA.   Function with EF estimated 60%; LVEDP elevated at 24 mm.   Successful PCI to the RCA with ultimate insertion of a 2.0 x 15 mm Resolute Onyx stent postdilated to 2.20 mm with the 80% stenosis being reduced to 0%.   RECOMMENDATION: DAPT for minimum of 6 months but probably longer.  Smoking cessation is essential.  Medical therapy for concomitant CAD.  Aggressive lipid-lowering therapy with target LDL less than 70.  Diagnostic Dominance: Right Intervention    EKG:  EKG is *** ordered today.  The ekg ordered today demonstrates ***  Recent Labs: 12/14/2019: Hemoglobin 13.2; Platelets 238  Recent Lipid Panel    Component Value Date/Time    CHOL 221 (H) 10/04/2019 1442   TRIG 1,254 (HH) 10/04/2019 1442   HDL 34 (L) 10/04/2019 1442   CHOLHDL 6.5 (H) 10/04/2019 1442   CHOLHDL 2.7 11/14/2017 2304   VLDL 15 11/14/2017 2304   LDLCALC Comment (A) 10/04/2019 1442    ASSESSMENT & PLAN    Chest discomfort-denies chest pain today.  Underwent cardiac catheterization 6/21 and received DES to his RCA.  Details above. Continue aspirin, Plavix, metoprolol Heart healthy low-sodium diet-salty 6 given Increase physical activity as tolerated  Coronary artery disease-status post CABG.  LHC 6/21 which showed 2/3 patent grafts.  He received PCI with DES to his RCA. Continue aspirin, Plavix, metoprolol, atorvastatin, nitroglycerin Heart healthy low-sodium diet-salty 6 given Increase physical activity as tolerated   Hyperlipidemia-LDL*** Continue atorvastatin, aspirin, Plavix Heart healthy low-sodium diet-salty 6 given Increase physical activity as tolerated   Hypertension-BP today***.  Well-controlled at home. Continue metoprolol Heart healthy low-sodium diet-salty 6 given Increase physical activity as tolerated  Abnormal LFTs-previously underwent liver biopsy which showed focal bridging fibrosis and minimally active steatohepatitis. Recommended EtOH cessation  Disposition: Follow-up with Dr. Harrington Challenger in 3-4 months.  {Are you ordering a CV Procedure (e.g. stress test, cath, DCCV, TEE, etc)?   Press F2        :[id number]}    Medication Adjustments/Labs and Tests Ordered: Current medicines are reviewed at length with the patient today.  Concerns regarding medicines are outlined above.  No orders of the defined types were placed in this encounter.  No orders of the defined types were placed in this encounter.   There are no Patient Instructions on file for this visit.   Signed, Deberah Pelton, NP  12/05/2020 7:57 AM      Notice: This dictation was prepared with Dragon dictation along with smaller phrase technology. Any  transcriptional errors that result from this process are unintentional and may not be corrected upon review.  I spent***minutes examining this patient, reviewing medications, and using patient centered shared decision making involving her cardiac care.  Prior to her visit I spent greater than 20 minutes reviewing her past medical history,  medications, and prior cardiac tests.

## 2020-12-06 ENCOUNTER — Ambulatory Visit (HOSPITAL_BASED_OUTPATIENT_CLINIC_OR_DEPARTMENT_OTHER): Payer: Medicaid Other | Admitting: General Practice

## 2020-12-11 ENCOUNTER — Other Ambulatory Visit: Payer: Self-pay | Admitting: Internal Medicine

## 2021-01-04 ENCOUNTER — Ambulatory Visit (HOSPITAL_BASED_OUTPATIENT_CLINIC_OR_DEPARTMENT_OTHER): Payer: Medicaid Other | Admitting: Family

## 2021-01-08 ENCOUNTER — Other Ambulatory Visit: Payer: Self-pay | Admitting: Internal Medicine

## 2021-01-10 ENCOUNTER — Telehealth: Payer: Self-pay | Admitting: *Deleted

## 2021-01-10 NOTE — Telephone Encounter (Signed)
   [facility] Pre-operative Risk Assessment    Patient Name: Greg [name]  DOB: 12-Dec-1970 MRN: [id number]  HEARTCARE STAFF:  - IMPORTANT!!!!!! Under Visit Info/Reason for Call, type in Other and utilize the format Clearance MM/DD/YY or Clearance TBD. Do not use dashes or single digits. - Please review there is not already an duplicate clearance open for this procedure. - If request is for dental extraction, please clarify the # of teeth to be extracted. - If the patient is currently at the dentist's office, call Pre-Op Callback Staff (MA/nurse) to input urgent request.  - If the patient is not currently in the dentist office, please route to the Pre-Op pool.  Request for surgical clearance:  What type of surgery is being performed?  COLONOSCOPY / EGD   When is this surgery scheduled?  04/04/21  What type of clearance is required (medical clearance vs. Pharmacy clearance to hold med vs. Both)?  BOTH  Are there any medications that need to be held prior to surgery and how long?  PLAVIX X'S 5 DAYS  Practice name and name of physician performing surgery?  [facility]  What is the office phone number?  [phone]   7.   What is the office fax number?  [phone]  8.   Anesthesia type (None, local, MAC, general) ?     Jeanann Lewandowsky 01/10/2021, 8:28 AM  _________________________________________________________________   (provider comments below)

## 2021-01-10 NOTE — Telephone Encounter (Signed)
Dr. Harrington Challenger Pt is greater that one year from most recent heart cath which showed 2/3 patent grafts and resulted in DES to RCA. We are asked to hold plavix for colonoscopy in March 2023. OK to hold?

## 2021-01-11 NOTE — Telephone Encounter (Signed)
Pt scheduled to see Laurann Montana, NP 02/15/21 @ 1:55 @ [place] location. I will forward notes to NP for upcoming appt. Will send FYI to requesting office pt has appt 02/15/21.

## 2021-01-11 NOTE — Telephone Encounter (Signed)
Left message for the pt to call the office to schedule an appt for pre op clearance. Procedure is set for 04/04/21, though I did leave message schedules fill up very quickly. Schedule appt sometime between Jan-feb 2023.

## 2021-01-11 NOTE — Telephone Encounter (Signed)
   Name: Greg [name]  DOB: 1970/04/26  MRN: [id number]  Primary Cardiologist: Dorris Carnes, MD  Chart reviewed as part of pre-operative protocol coverage. Because of [name]'s past medical history and time since last visit, he will require a follow-up visit in order to better assess preoperative cardiovascular risk.  Per chart review, last OV was 02/2020. Procedure date is not scheduled until 04/04/21. Therefore patient will have been due for 1 year follow-up before procedure. No upcoming appt on file yet with [place].  Pre-op covering staff: - Please schedule appointment and call patient to inform them.  - Please contact requesting surgeon's office via preferred method (i.e, phone, fax) to inform them of need for appointment prior to surgery.  Dr. Harrington Challenger did weigh in regarding holding Plavix, which can be referenced at time of final OV to finalize recommendations.  Charlie Pitter, PA-C  01/11/2021, 11:27 AM

## 2021-01-21 ENCOUNTER — Other Ambulatory Visit: Payer: Self-pay | Admitting: Internal Medicine

## 2021-01-25 ENCOUNTER — Observation Stay (HOSPITAL_COMMUNITY)
Admission: EM | Admit: 2021-01-25 | Discharge: 2021-01-26 | DRG: 871 | Discharge status: Against Medical Advice | Payer: Medicaid Other | Attending: Student | Admitting: Student

## 2021-01-25 ENCOUNTER — Emergency Department (HOSPITAL_COMMUNITY): Payer: Medicaid Other

## 2021-01-25 ENCOUNTER — Other Ambulatory Visit: Payer: Self-pay

## 2021-01-25 DIAGNOSIS — E785 Hyperlipidemia, unspecified: Secondary | ICD-10-CM | POA: Diagnosis not present

## 2021-01-25 DIAGNOSIS — D75839 Thrombocytosis, unspecified: Secondary | ICD-10-CM | POA: Diagnosis not present

## 2021-01-25 DIAGNOSIS — I2510 Atherosclerotic heart disease of native coronary artery without angina pectoris: Secondary | ICD-10-CM | POA: Diagnosis not present

## 2021-01-25 DIAGNOSIS — F419 Anxiety disorder, unspecified: Secondary | ICD-10-CM | POA: Diagnosis not present

## 2021-01-25 DIAGNOSIS — Z720 Tobacco use: Secondary | ICD-10-CM | POA: Diagnosis present

## 2021-01-25 DIAGNOSIS — R7401 Elevation of levels of liver transaminase levels: Secondary | ICD-10-CM | POA: Diagnosis not present

## 2021-01-25 DIAGNOSIS — E440 Moderate protein-calorie malnutrition: Secondary | ICD-10-CM | POA: Diagnosis not present

## 2021-01-25 DIAGNOSIS — I252 Old myocardial infarction: Secondary | ICD-10-CM | POA: Diagnosis not present

## 2021-01-25 DIAGNOSIS — J439 Emphysema, unspecified: Secondary | ICD-10-CM | POA: Diagnosis present

## 2021-01-25 DIAGNOSIS — K219 Gastro-esophageal reflux disease without esophagitis: Secondary | ICD-10-CM | POA: Diagnosis present

## 2021-01-25 DIAGNOSIS — F17210 Nicotine dependence, cigarettes, uncomplicated: Secondary | ICD-10-CM | POA: Diagnosis present

## 2021-01-25 DIAGNOSIS — J9621 Acute and chronic respiratory failure with hypoxia: Secondary | ICD-10-CM | POA: Diagnosis not present

## 2021-01-25 DIAGNOSIS — Z8249 Family history of ischemic heart disease and other diseases of the circulatory system: Secondary | ICD-10-CM

## 2021-01-25 DIAGNOSIS — A419 Sepsis, unspecified organism: Secondary | ICD-10-CM | POA: Diagnosis present

## 2021-01-25 DIAGNOSIS — I25810 Atherosclerosis of coronary artery bypass graft(s) without angina pectoris: Secondary | ICD-10-CM | POA: Diagnosis present

## 2021-01-25 DIAGNOSIS — R069 Unspecified abnormalities of breathing: Secondary | ICD-10-CM

## 2021-01-25 DIAGNOSIS — R040 Epistaxis: Secondary | ICD-10-CM | POA: Diagnosis not present

## 2021-01-25 DIAGNOSIS — Z7982 Long term (current) use of aspirin: Secondary | ICD-10-CM

## 2021-01-25 DIAGNOSIS — R042 Hemoptysis: Secondary | ICD-10-CM | POA: Diagnosis not present

## 2021-01-25 DIAGNOSIS — T796XXA Traumatic ischemia of muscle, initial encounter: Secondary | ICD-10-CM | POA: Diagnosis present

## 2021-01-25 DIAGNOSIS — Z79899 Other long term (current) drug therapy: Secondary | ICD-10-CM

## 2021-01-25 DIAGNOSIS — W01198A Fall on same level from slipping, tripping and stumbling with subsequent striking against other object, initial encounter: Secondary | ICD-10-CM | POA: Diagnosis not present

## 2021-01-25 DIAGNOSIS — Z6821 Body mass index (BMI) 21.0-21.9, adult: Secondary | ICD-10-CM | POA: Diagnosis not present

## 2021-01-25 DIAGNOSIS — Z85828 Personal history of other malignant neoplasm of skin: Secondary | ICD-10-CM

## 2021-01-25 DIAGNOSIS — Z20822 Contact with and (suspected) exposure to covid-19: Secondary | ICD-10-CM | POA: Diagnosis present

## 2021-01-25 DIAGNOSIS — Z951 Presence of aortocoronary bypass graft: Secondary | ICD-10-CM

## 2021-01-25 DIAGNOSIS — I10 Essential (primary) hypertension: Secondary | ICD-10-CM | POA: Diagnosis present

## 2021-01-25 DIAGNOSIS — K7581 Nonalcoholic steatohepatitis (NASH): Secondary | ICD-10-CM | POA: Diagnosis present

## 2021-01-25 DIAGNOSIS — Z955 Presence of coronary angioplasty implant and graft: Secondary | ICD-10-CM | POA: Diagnosis not present

## 2021-01-25 DIAGNOSIS — R296 Repeated falls: Secondary | ICD-10-CM | POA: Diagnosis not present

## 2021-01-25 DIAGNOSIS — Z7902 Long term (current) use of antithrombotics/antiplatelets: Secondary | ICD-10-CM

## 2021-01-25 DIAGNOSIS — R0603 Acute respiratory distress: Secondary | ICD-10-CM

## 2021-01-25 DIAGNOSIS — R7989 Other specified abnormal findings of blood chemistry: Secondary | ICD-10-CM

## 2021-01-25 DIAGNOSIS — J9601 Acute respiratory failure with hypoxia: Secondary | ICD-10-CM

## 2021-01-25 DIAGNOSIS — J449 Chronic obstructive pulmonary disease, unspecified: Secondary | ICD-10-CM | POA: Diagnosis present

## 2021-01-25 LAB — COMPREHENSIVE METABOLIC PANEL
ALT: 369 U/L — ABNORMAL HIGH (ref 0–44)
AST: 810 U/L — ABNORMAL HIGH (ref 15–41)
Albumin: 2.3 g/dL — ABNORMAL LOW (ref 3.5–5.0)
Alkaline Phosphatase: 232 U/L — ABNORMAL HIGH (ref 38–126)
Anion gap: 12 (ref 5–15)
BUN: 6 mg/dL (ref 6–20)
CO2: 28 mmol/L (ref 22–32)
Calcium: 8.5 mg/dL — ABNORMAL LOW (ref 8.9–10.3)
Chloride: 98 mmol/L (ref 98–111)
Creatinine, Ser: 0.75 mg/dL (ref 0.61–1.24)
GFR, Estimated: >60 mL/min (ref >60)
Glucose, Bld: 105 mg/dL — ABNORMAL HIGH (ref 70–99)
Potassium: 4.4 mmol/L (ref 3.5–5.1)
Sodium: 138 mmol/L (ref 135–145)
Total Bilirubin: 1.5 mg/dL — ABNORMAL HIGH (ref 0.3–1.2)
Total Protein: 7 g/dL (ref 6.5–8.1)

## 2021-01-25 LAB — PROTIME-INR
INR: 1.1 (ref 0.8–1.2)
Prothrombin Time: 13.9 seconds (ref 11.4–15.2)

## 2021-01-25 LAB — LACTIC ACID, PLASMA: Lactic Acid, Venous: 3.1 mmol/L (ref 0.5–1.9)

## 2021-01-25 LAB — ETHANOL: Alcohol, Ethyl (B): <10 mg/dL (ref <10)

## 2021-01-25 LAB — RESP PANEL BY RT-PCR (FLU A&B, COVID) ARPGX2
Influenza A by PCR: NEGATIVE
Influenza B by PCR: NEGATIVE
SARS Coronavirus 2 by RT PCR: NEGATIVE

## 2021-01-25 LAB — CBC WITH DIFFERENTIAL/PLATELET
Abs Immature Granulocytes: 0.08 10*3/uL — ABNORMAL HIGH (ref 0.00–0.07)
Basophils Absolute: 0.1 10*3/uL (ref 0.0–0.1)
Basophils Relative: 1 %
Eosinophils Absolute: 0 10*3/uL (ref 0.0–0.5)
Eosinophils Relative: 0 %
HCT: 37.6 % — ABNORMAL LOW (ref 39.0–52.0)
Hemoglobin: 12.4 g/dL — ABNORMAL LOW (ref 13.0–17.0)
Immature Granulocytes: 0 %
Lymphocytes Relative: 10 %
Lymphs Abs: 1.7 10*3/uL (ref 0.7–4.0)
MCH: 30.4 pg (ref 26.0–34.0)
MCHC: 33 g/dL (ref 30.0–36.0)
MCV: 92.2 fL (ref 80.0–100.0)
Monocytes Absolute: 1.8 10*3/uL — ABNORMAL HIGH (ref 0.1–1.0)
Monocytes Relative: 10 %
Neutro Abs: 14.7 10*3/uL — ABNORMAL HIGH (ref 1.7–7.7)
Neutrophils Relative %: 79 %
Platelets: 432 10*3/uL — ABNORMAL HIGH (ref 150–400)
RBC: 4.08 MIL/uL — ABNORMAL LOW (ref 4.22–5.81)
RDW: 18.7 % — ABNORMAL HIGH (ref 11.5–15.5)
WBC: 18.4 10*3/uL — ABNORMAL HIGH (ref 4.0–10.5)
nRBC: 0 % (ref 0.0–0.2)

## 2021-01-25 LAB — URINALYSIS, ROUTINE W REFLEX MICROSCOPIC
Bacteria, UA: NONE SEEN
Bilirubin Urine: NEGATIVE
Glucose, UA: NEGATIVE mg/dL
Ketones, ur: NEGATIVE mg/dL
Leukocytes,Ua: NEGATIVE
Nitrite: NEGATIVE
Protein, ur: 100 mg/dL — AB
Specific Gravity, Urine: 1.026 (ref 1.005–1.030)
pH: 6 (ref 5.0–8.0)

## 2021-01-25 LAB — ACETAMINOPHEN LEVEL: Acetaminophen (Tylenol), Serum: <10 ug/mL — ABNORMAL LOW (ref 10–30)

## 2021-01-25 LAB — APTT: aPTT: 34 seconds (ref 24–36)

## 2021-01-25 MED ORDER — SODIUM CHLORIDE 0.9 % IV SOLN
2.0000 g | Freq: Three times a day (TID) | INTRAVENOUS | Status: DC
  Administered 2021-01-26 (×2): 2 g via INTRAVENOUS
  Filled 2021-01-25 (×2): qty 2

## 2021-01-25 MED ORDER — LACTATED RINGERS IV BOLUS (SEPSIS)
1000.0000 mL | Freq: Once | INTRAVENOUS | Status: AC
  Administered 2021-01-25: 1000 mL via INTRAVENOUS

## 2021-01-25 MED ORDER — SODIUM CHLORIDE 0.9 % IV SOLN
2.0000 g | Freq: Once | INTRAVENOUS | Status: AC
  Administered 2021-01-25: 2 g via INTRAVENOUS
  Filled 2021-01-25: qty 2

## 2021-01-25 MED ORDER — LACTATED RINGERS IV BOLUS (SEPSIS)
250.0000 mL | Freq: Once | INTRAVENOUS | Status: AC
  Administered 2021-01-26: 02:00:00 250 mL via INTRAVENOUS

## 2021-01-25 MED ORDER — LACTATED RINGERS IV SOLN: INTRAVENOUS | Status: DC

## 2021-01-25 MED ORDER — VANCOMYCIN HCL 750 MG/150ML IV SOLN
750.0000 mg | Freq: Three times a day (TID) | INTRAVENOUS | Status: DC
  Administered 2021-01-26: 750 mg via INTRAVENOUS
  Filled 2021-01-25 (×4): qty 150

## 2021-01-25 MED ORDER — VANCOMYCIN HCL 1500 MG/300ML IV SOLN
1500.0000 mg | Freq: Once | INTRAVENOUS | Status: AC
  Administered 2021-01-26: 02:00:00 1500 mg via INTRAVENOUS
  Filled 2021-01-25: qty 300

## 2021-01-25 MED ORDER — METRONIDAZOLE 500 MG/100ML IV SOLN
500.0000 mg | Freq: Once | INTRAVENOUS | Status: AC
  Administered 2021-01-26: 500 mg via INTRAVENOUS
  Filled 2021-01-25: qty 100

## 2021-01-25 NOTE — ED Notes (Signed)
Triage RN notified of oxygen saturation

## 2021-01-25 NOTE — ED Triage Notes (Addendum)
Pt c/o weakness and shortness of breath x 4 days. Pt has a history of COPD. Pt also reports falling earlier today and hitting his head.

## 2021-01-25 NOTE — Sepsis Progress Note (Signed)
Elink following Code Sepsis. 

## 2021-01-25 NOTE — Progress Notes (Signed)
Pharmacy Antibiotic Note  Greg [name] is a 50 y.o. male admitted on 01/25/2021 with  sepsis 2/2 unknown source .  Pharmacy has been consulted for Cefepime and vancomycin dosing.  WBC 18.4, SCr wnl  Plan: -Cefepime 2 gm IV Q 8 hours -Vancomycin 1500 mg IV load followed by Vancomycin 750 mg IV Q 8 hrs. Goal AUC 400-550. Expected AUC: 501 SCr used: 0.75  -Monitor CBC, renal fx, cultures and clinical progress -Vanc levels at steady state   Height: 5' 10"  (177.8 cm) Weight: 67.1 kg (148 lb) IBW/kg (Calculated) : 73  Temp (24hrs), Avg:99 F (37.2 C), Min:98.9 F (37.2 C), Max:99 F (37.2 C)  Recent Labs  Lab 01/25/21 2048  WBC 18.4*  CREATININE 0.75    Estimated Creatinine Clearance: 102.5 mL/min (by C-G formula based on SCr of 0.75 mg/dL).    No Known Allergies  Antimicrobials this admission: Cefepime 12/22 >>  Vancomycin 12/22 >>  Metronidazole 12/22 >>   Dose adjustments this admission:   Microbiology results: 12/22 BCx >> 12/22 UCx >>   Thank you for allowing pharmacy to be a part of this patients care.  Albertina Parr, PharmD., BCPS, BCCCP Clinical Pharmacist Please refer to [facility] for unit-specific pharmacist

## 2021-01-25 NOTE — ED Provider Notes (Signed)
Emergency Medicine Provider Triage Evaluation Note  Greg [name] , a 50 y.o. male  was evaluated in triage.  Pt complains of shortness of breath and generalized weakness as well as fall and head injury.  He reports that he has had generalized weakness and shortness of breath over the last 4 days.  Symptoms have been constant over this time.  Patient endorses productive cough.  Patient reports that he fell earlier today hitting his head.  Patient is currently taking Plavix.  Patient denies any home oxygen use  Review of Systems  Positive: Shortness of breath, generalized weakness, cough, rhinorrhea, nasal congestion, chills, Negative: Nausea, vomiting, abdominal pain, hemoptysis, chest pain, numbness, weakness  Physical Exam  BP 137/74 (BP Location: Left Arm)    Pulse (!) 104    Temp 98.9 F (37.2 C) (Oral)    Resp 20    Ht 5' 10"  (1.778 m)    Wt 67.1 kg    SpO2 (!) 87%    BMI 21.24 kg/m  Gen:   Awake, no distress   Resp:  Normal effort, decreased lung sounds throughout MSK:   Moves extremities without difficulty  Other:  Abdomen soft, nondistended, nontender  Medical Decision Making  Medically screening exam initiated at 8:45 PM.  Appropriate orders placed.  Greg [name] was informed that the remainder of the evaluation will be completed by another provider, this initial triage assessment does not replace that evaluation, and the importance of remaining in the ED until their evaluation is complete.  Patient is oxygen saturation was found to be 87% on room air.  Patient was placed on 3 LPM of O2 via nasal cannula.  Will move patient back to next available room due to new oxygen requirement.   Dyann Ruddle 01/25/21 2047    Teressa Lower, MD 01/25/21 [phone]

## 2021-01-26 ENCOUNTER — Inpatient Hospital Stay (HOSPITAL_COMMUNITY): Payer: Medicaid Other

## 2021-01-26 ENCOUNTER — Emergency Department (HOSPITAL_COMMUNITY): Payer: Medicaid Other

## 2021-01-26 ENCOUNTER — Encounter (HOSPITAL_COMMUNITY): Payer: Self-pay | Admitting: Internal Medicine

## 2021-01-26 DIAGNOSIS — J9601 Acute respiratory failure with hypoxia: Secondary | ICD-10-CM | POA: Diagnosis not present

## 2021-01-26 DIAGNOSIS — R7989 Other specified abnormal findings of blood chemistry: Secondary | ICD-10-CM | POA: Diagnosis not present

## 2021-01-26 DIAGNOSIS — A419 Sepsis, unspecified organism: Secondary | ICD-10-CM | POA: Diagnosis not present

## 2021-01-26 DIAGNOSIS — R042 Hemoptysis: Secondary | ICD-10-CM

## 2021-01-26 DIAGNOSIS — J9621 Acute and chronic respiratory failure with hypoxia: Secondary | ICD-10-CM

## 2021-01-26 LAB — BASIC METABOLIC PANEL
Anion gap: 7 (ref 5–15)
BUN: 6 mg/dL (ref 6–20)
CO2: 27 mmol/L (ref 22–32)
Calcium: 7.9 mg/dL — ABNORMAL LOW (ref 8.9–10.3)
Chloride: 100 mmol/L (ref 98–111)
Creatinine, Ser: 0.73 mg/dL (ref 0.61–1.24)
GFR, Estimated: >60 mL/min (ref >60)
Glucose, Bld: 115 mg/dL — ABNORMAL HIGH (ref 70–99)
Potassium: 3.4 mmol/L — ABNORMAL LOW (ref 3.5–5.1)
Sodium: 134 mmol/L — ABNORMAL LOW (ref 135–145)

## 2021-01-26 LAB — CBC WITH DIFFERENTIAL/PLATELET
Abs Immature Granulocytes: 0.09 10*3/uL — ABNORMAL HIGH (ref 0.00–0.07)
Basophils Absolute: 0.1 10*3/uL (ref 0.0–0.1)
Basophils Relative: 1 %
Eosinophils Absolute: 0 10*3/uL (ref 0.0–0.5)
Eosinophils Relative: 0 %
HCT: 30.3 % — ABNORMAL LOW (ref 39.0–52.0)
Hemoglobin: 10.2 g/dL — ABNORMAL LOW (ref 13.0–17.0)
Immature Granulocytes: 1 %
Lymphocytes Relative: 11 %
Lymphs Abs: 2.2 10*3/uL (ref 0.7–4.0)
MCH: 30.7 pg (ref 26.0–34.0)
MCHC: 33.7 g/dL (ref 30.0–36.0)
MCV: 91.3 fL (ref 80.0–100.0)
Monocytes Absolute: 2.2 10*3/uL — ABNORMAL HIGH (ref 0.1–1.0)
Monocytes Relative: 11 %
Neutro Abs: 15.4 10*3/uL — ABNORMAL HIGH (ref 1.7–7.7)
Neutrophils Relative %: 76 %
Platelets: 330 10*3/uL (ref 150–400)
RBC: 3.32 MIL/uL — ABNORMAL LOW (ref 4.22–5.81)
RDW: 18.7 % — ABNORMAL HIGH (ref 11.5–15.5)
WBC: 20 10*3/uL — ABNORMAL HIGH (ref 4.0–10.5)
nRBC: 0 % (ref 0.0–0.2)

## 2021-01-26 LAB — CBC
HCT: 29.4 % — ABNORMAL LOW (ref 39.0–52.0)
Hemoglobin: 9.9 g/dL — ABNORMAL LOW (ref 13.0–17.0)
MCH: 30.5 pg (ref 26.0–34.0)
MCHC: 33.7 g/dL (ref 30.0–36.0)
MCV: 90.5 fL (ref 80.0–100.0)
Platelets: 315 10*3/uL (ref 150–400)
RBC: 3.25 MIL/uL — ABNORMAL LOW (ref 4.22–5.81)
RDW: 18.5 % — ABNORMAL HIGH (ref 11.5–15.5)
WBC: 20 10*3/uL — ABNORMAL HIGH (ref 4.0–10.5)
nRBC: 0.1 % (ref 0.0–0.2)

## 2021-01-26 LAB — HEPATIC FUNCTION PANEL
ALT: 352 U/L — ABNORMAL HIGH (ref 0–44)
AST: 817 U/L — ABNORMAL HIGH (ref 15–41)
Albumin: 1.9 g/dL — ABNORMAL LOW (ref 3.5–5.0)
Alkaline Phosphatase: 196 U/L — ABNORMAL HIGH (ref 38–126)
Bilirubin, Direct: 0.6 mg/dL — ABNORMAL HIGH (ref 0.0–0.2)
Indirect Bilirubin: 1 mg/dL — ABNORMAL HIGH (ref 0.3–0.9)
Total Bilirubin: 1.6 mg/dL — ABNORMAL HIGH (ref 0.3–1.2)
Total Protein: 5.9 g/dL — ABNORMAL LOW (ref 6.5–8.1)

## 2021-01-26 LAB — BLOOD GAS, ARTERIAL
Acid-Base Excess: 2.7 mmol/L — ABNORMAL HIGH (ref 0.0–2.0)
Bicarbonate: 26.8 mmol/L (ref 20.0–28.0)
Drawn by: 12971
FIO2: 100
O2 Saturation: 96.4 %
Patient temperature: 38.1
pCO2 arterial: 44.3 mmHg (ref 32.0–48.0)
pH, Arterial: 7.404 (ref 7.350–7.450)
pO2, Arterial: 97.9 mmHg (ref 83.0–108.0)

## 2021-01-26 LAB — HEMOGLOBIN AND HEMATOCRIT, BLOOD
HCT: 29 % — ABNORMAL LOW (ref 39.0–52.0)
Hemoglobin: 9.7 g/dL — ABNORMAL LOW (ref 13.0–17.0)

## 2021-01-26 LAB — PROCALCITONIN: Procalcitonin: 61.67 ng/mL

## 2021-01-26 LAB — LACTIC ACID, PLASMA
Lactic Acid, Venous: 4 mmol/L (ref 0.5–1.9)
Lactic Acid, Venous: 2.3 mmol/L (ref 0.5–1.9)
Lactic Acid, Venous: 2.3 mmol/L (ref 0.5–1.9)

## 2021-01-26 LAB — HEPATITIS PANEL, ACUTE
HCV Ab: NONREACTIVE
Hep A IgM: NONREACTIVE
Hep B C IgM: NONREACTIVE
Hepatitis B Surface Ag: NONREACTIVE

## 2021-01-26 LAB — CK: Total CK: 16349 U/L — ABNORMAL HIGH (ref 49–397)

## 2021-01-26 LAB — TROPONIN I (HIGH SENSITIVITY)
Troponin I (High Sensitivity): 46 ng/L — ABNORMAL HIGH (ref <18)
Troponin I (High Sensitivity): 36 ng/L — ABNORMAL HIGH (ref <18)

## 2021-01-26 LAB — CREATININE, SERUM
Creatinine, Ser: 0.71 mg/dL (ref 0.61–1.24)
GFR, Estimated: >60 mL/min (ref >60)

## 2021-01-26 LAB — MRSA NEXT GEN BY PCR, NASAL: MRSA by PCR Next Gen: NOT DETECTED

## 2021-01-26 LAB — HIV ANTIBODY (ROUTINE TESTING W REFLEX): HIV Screen 4th Generation wRfx: NONREACTIVE

## 2021-01-26 MED ORDER — NICOTINE 7 MG/24HR TD PT24: 7.0000 mg | MEDICATED_PATCH | Freq: Every day | TRANSDERMAL | Status: DC

## 2021-01-26 MED ORDER — SODIUM CHLORIDE 0.9 % IV SOLN: INTRAVENOUS | Status: DC

## 2021-01-26 MED ORDER — GUAIFENESIN ER 600 MG PO TB12
1200.0000 mg | ORAL_TABLET | Freq: Two times a day (BID) | ORAL | Status: DC
  Filled 2021-01-26: qty 2

## 2021-01-26 MED ORDER — OXYMETAZOLINE HCL 0.05 % NA SOLN
3.0000 | Freq: Two times a day (BID) | NASAL | Status: DC | PRN
  Filled 2021-01-26: qty 30

## 2021-01-26 MED ORDER — METOPROLOL TARTRATE 25 MG PO TABS
37.5000 mg | ORAL_TABLET | Freq: Two times a day (BID) | ORAL | Status: DC
  Administered 2021-01-26: 37.5 mg via ORAL
  Filled 2021-01-26: qty 1

## 2021-01-26 MED ORDER — UMECLIDINIUM-VILANTEROL 62.5-25 MCG/ACT IN AEPB
1.0000 | INHALATION_SPRAY | Freq: Every day | RESPIRATORY_TRACT | Status: DC
  Administered 2021-01-26: 1 via RESPIRATORY_TRACT
  Filled 2021-01-26: qty 14

## 2021-01-26 MED ORDER — ACETAMINOPHEN 325 MG PO TABS
650.0000 mg | ORAL_TABLET | Freq: Once | ORAL | Status: AC
  Administered 2021-01-26: 650 mg via ORAL
  Filled 2021-01-26: qty 2

## 2021-01-26 MED ORDER — CLOPIDOGREL BISULFATE 75 MG PO TABS: 75.0000 mg | ORAL_TABLET | Freq: Every day | ORAL | Status: DC

## 2021-01-26 MED ORDER — PANTOPRAZOLE SODIUM 40 MG PO TBEC: 40.0000 mg | DELAYED_RELEASE_TABLET | Freq: Every day | ORAL | Status: DC

## 2021-01-26 MED ORDER — IOHEXOL 350 MG/ML SOLN
79.0000 mL | Freq: Once | INTRAVENOUS | Status: AC | PRN
  Administered 2021-01-26: 79 mL via INTRAVENOUS

## 2021-01-26 MED ORDER — HEPARIN SODIUM (PORCINE) 5000 UNIT/ML IJ SOLN
5000.0000 [IU] | Freq: Three times a day (TID) | INTRAMUSCULAR | Status: DC
  Administered 2021-01-26: 5000 [IU] via SUBCUTANEOUS
  Filled 2021-01-26: qty 1

## 2021-01-26 MED ORDER — NITROGLYCERIN 0.4 MG SL SUBL: 0.4000 mg | SUBLINGUAL_TABLET | SUBLINGUAL | Status: DC | PRN

## 2021-01-26 MED ORDER — ASPIRIN EC 81 MG PO TBEC: 81.0000 mg | DELAYED_RELEASE_TABLET | Freq: Every day | ORAL | Status: DC

## 2021-01-26 MED ORDER — ALPRAZOLAM 0.5 MG PO TABS
1.0000 mg | ORAL_TABLET | Freq: Three times a day (TID) | ORAL | Status: DC
  Administered 2021-01-26: 1 mg via ORAL
  Filled 2021-01-26: qty 2

## 2021-01-26 MED ORDER — LACTATED RINGERS IV SOLN: INTRAVENOUS | Status: DC

## 2021-01-26 MED ORDER — ALBUTEROL SULFATE (2.5 MG/3ML) 0.083% IN NEBU: 2.5000 mg | INHALATION_SOLUTION | Freq: Four times a day (QID) | RESPIRATORY_TRACT | Status: DC | PRN

## 2021-01-26 MED ORDER — PANTOPRAZOLE SODIUM 40 MG IV SOLR
40.0000 mg | Freq: Two times a day (BID) | INTRAVENOUS | Status: DC
Start: 2021-01-26 — End: 2021-01-27
  Administered 2021-01-26: 40 mg via INTRAVENOUS
  Filled 2021-01-26: qty 40

## 2021-01-26 MED ORDER — PHENYLEPHRINE HCL 0.25 % NA SOLN: 1.0000 | Freq: Four times a day (QID) | NASAL | Status: DC | PRN

## 2021-01-26 MED ORDER — ORAL CARE MOUTH RINSE: 15.0000 mL | Freq: Two times a day (BID) | OROMUCOSAL | Status: DC

## 2021-01-26 MED ORDER — NICOTINE 21 MG/24HR TD PT24
21.0000 mg | MEDICATED_PATCH | Freq: Every day | TRANSDERMAL | Status: DC
  Administered 2021-01-26: 21 mg via TRANSDERMAL
  Filled 2021-01-26: qty 1

## 2021-01-26 NOTE — Discharge Summary (Signed)
Physician Discharge Summary  Patient ID: Greg [name] MRN: [id number] DOB/AGE: 1970/03/24 50 y.o.  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  He is having worsening ability to get around secondary to joint issues.  He has had multiple falls and one occurring 01/25/2022 with injury to the head.  He noticed increasing epistaxis and required a Rhino Rocket in his right nares.  During the night of 01/26/2021 increasing hypoxic respiratory failure along with constant and copious hemorrhage from either nares or hemoptysis.  He is now requiring 70% nonrebreather with O2 sats of 90% in the setting of severe emphysema and COPD.  He was transferred to ICU where his oxygen per face tent was rapidly weaned off. He was resentful of transfer to ICU with less privacy in room. He also felt we were offering little for him medically. I explained that we were watching for any recurrent epistaxis and that if it recurred it could lead to respiratory failure and death especially if it occurred outside of the hospital. I also explained that he was on broad spectrum antibiotics for sepsis with unclear source and that there would be potential for him to clinically deteriorate from untreated infection, which could also lead to death. I offered to reach out to neurology to clarify whether MRI fully explained his lower extremity weakness, he declined. He was able to reiterate these risks to me and  he chose to leave AMA.  Consults: pulmonary/intensive care  Significant Diagnostic Studies:  BCx 01/25/21 NGTD  CT CAP 01/26/21 1. No CT evidence for acute pulmonary embolus. 2. 2.2 x 1.8 cm ill-defined consolidative nodular opacity in the posterior right upper lobe. This finding is associated with scattered areas of architectural distortion and ground-glass opacity in the left lung. Findings likely related to infectious/inflammatory etiology although the posterior right upper lobe lesion has a more consolidative/solid character warranting close follow-up to exclude neoplasm. 3. Hepatomegaly. No overt signs of cirrhosis. 4. Stable tiny left adrenal nodules since 11/12/2017 consistent with benign etiology such as adenomas. 5. Aortic Atherosclerosis   Treatments: IV antibiotics, LR infusion, plavix/ASA held  Discharge Exam: Blood pressure 136/82, pulse (!) 106, temperature 98.7 F (37.1 C), temperature source Oral, resp. rate 20, height 5' 9"  (1.753 m), weight 67.1 kg, SpO2 95 %. See progress note from today  Disposition:  Home     Signed: Maryjane Hurter 01/26/2021, 3:49 PM

## 2021-01-26 NOTE — Significant Event (Signed)
Rapid Response Event Note   Reason for Call :  Upper GI bleed vs. Epistaxis  Initial Focused Assessment:  I was called to come assess this patient because he has been coughing up blood which is getting progressively worse. According to his wife, he has had an episode of epistaxis that was similar to this. He had bright red blood on his face, beard, and blood was all over his sheet. Of note, patient is 81 mg ASA and daily plavix. He was given 5K U of SQ heparin at 0706 this morning.   He denies and melena or tarry stools. The patient denies any alcohol consumption recently. He admits that his stomach is "bad" and it won't let him drink. He has a scheduled EGD and colonoscopy. No variceal swelling noted. The patient has hepatomegaly on CT but it is not revealing cirrhosis.   The patient has rhonchus lung sounds bilaterally that are heard from outside the room. On blood gas he is mildly hypoxic, but with his long term smoking status this might be his normal. Patient was on 6L  when I got to the room, but he was only sating at 90%. Currently, he is on 16L NRB.   ABG 7.4, 44.3, 26.8 Lactic 2.3 <---- 4.0    Interventions:  Patient was cleaned up. Patient was placed on NRB 16L. Chest xray ordered. ABG obtained. Patient chart reviewed  Plan of Care:  Patient will be admitted to ICU for closer monitoring.    Event Summary:   MD Notified: Dr. Erlinda Hong and Richardson Landry NP Call Time: 8811 Arrival Time: 0315 End Time: [place], RN

## 2021-01-26 NOTE — Progress Notes (Signed)
Initial Nutrition Assessment  DOCUMENTATION CODES:   Non-severe (moderate) malnutrition in context of chronic illness  INTERVENTION:  If pt is unable to have diet advanced within 24-48 hours, recommend initiation of enteral nutrition. Consider: -Vital 1.5 @ 41m/hr, advance by 121mhr Q4H until goal rate of 6020mr is reached (1440m55m -45ml69msource TF daily  At goal, TF would provide 2200 kcals, 108 grams protein, 1100ml 29m water   NUTRITION DIAGNOSIS:   Moderate Malnutrition related to chronic illness (COPD) as evidenced by severe fat depletion, moderate fat depletion, mild muscle depletion, moderate muscle depletion, severe muscle depletion, energy intake < 75% for > or equal to 1 month  GOAL:   Patient will meet greater than or equal to 90% of their needs  MONITOR:   Labs, Weight trends, I & O's, Other (Comment) (plan of care)  REASON FOR ASSESSMENT:   Malnutrition Screening Tool    ASSESSMENT:   50 yea33old former alcoholic who has been dry for approximately 1 year is a heavy smoker and has intermittent hemoptysis over the years.  He is having worsening ability to get around 2/2 joint issues.  He has had multiple falls and one occurring 01/25/2022 with injury to the head.  He noticed increasing epistaxis and required a Rhino Rocket in his right nares.  During the night of 01/26/2021 increasing hypoxic respiratory failure along with constant and copious hemorrhage from either nares or hemoptysis.  He is now requiring 70% nonrebreather with O2 sats of 90% in the setting of severe emphysema and COPD.  Pt required transfer to ICU for further evaluation and treatment and possible intubation. PMH also includes COPD, GERD, CAD, HTN, h/o MI, HLD, anxiety, asthma.  Discussed pt with RN.  Pt unable to provide clear history -- seems confused regarding timeline of his nutrition/weight history and states his wife would be a better source of this information. Pt shared that his wife  is supposed to be in the room, but had to step out. RD will attempt to obtain more detailed/accurate history from pt's wife at follow-up. Pt did report that he has a prior h/o EtOH abuse but has not been drinking for some time now. Pt also shared that he typically eats 1 meal per day. Pt did not provide details on what types of food the meal consists of, simply focuses on what can be eaten the quickest as he doesn't have much of an appetite.   Pt NPO at the current time. Awaiting plan of care. If pt unable to have diet within 24-48 hours, recommend initiation of enteral nutrition support.   Weight history reviewed. No significant weight changes noted.   UOP: 700ml x83mours I/O: -400ml si80madmit  Medications: protonix, IV abx, LR @ 100ml/hr 60m: Recent Labs  Lab 01/25/21 2048 01/26/21 0401 01/26/21 0857  NA 138  --  134*  K 4.4  --  3.4*  CL 98  --  100  CO2 28  --  27  BUN 6  --  6  CREATININE 0.75 0.71 0.73  CALCIUM 8.5*  --  7.9*  GLUCOSE 105*  --  115*  Elevated LFTs  Alkaline Phosphatase 196 (H) CK total 16349 (H) Troponin I 46 (H) Lactic acid 2.3 (H)  NUTRITION - FOCUSED PHYSICAL EXAM:  Flowsheet Row Most Recent Value  Orbital Region Severe depletion  Upper Arm Region Severe depletion  Thoracic and Lumbar Region Moderate depletion  Buccal Region Unable to assess  Temple Region Moderate depletion  Clavicle Bone Region Mild  depletion  Clavicle and Acromion Bone Region Moderate depletion  Scapular Bone Region Moderate depletion  Dorsal Hand Severe depletion  Patellar Region Moderate depletion  Anterior Thigh Region Moderate depletion  Posterior Calf Region Mild depletion  Edema (RD Assessment) Mild  Hair Reviewed  Eyes Reviewed  Mouth Unable to assess  Skin Reviewed  Nails Reviewed       Diet Order:   Diet Order             Diet NPO time specified  Diet effective now                   EDUCATION NEEDS:   Not appropriate for education at this  time  Skin:  Skin Assessment: Reviewed RN Assessment  Last BM:  12/22  Height:   Ht Readings from Last 1 Encounters:  01/26/21 5' 9"  (1.753 m)    Weight:   Wt Readings from Last 1 Encounters:  01/26/21 67.1 kg     BMI:  Body mass index is 21.85 kg/m.  Estimated Nutritional Needs:   Kcal:  2100-2300  Protein:  105-115 grams  Fluid:  >2.1L     Theone Stanley., MS, RD, LDN (she/her/hers) RD pager number and weekend/on-call pager number located in [place].

## 2021-01-26 NOTE — Sepsis Progress Note (Addendum)
Notified provider of need to order repeat lactic acid due to 2nd lactic being higher than 1st

## 2021-01-26 NOTE — Progress Notes (Signed)
Patient arrived to 37M room 12 at 02:00. GCS 15 2L [place]. VSS. Wife at bedside. Erling Conte, RN

## 2021-01-26 NOTE — Progress Notes (Signed)
Patient requested to leave AMA; Leslye Peer, MD, came to bedside to explain risks of leaving at this time. Patient expressed understanding of potential risks and MD agreed that patient was of sound mind in making this decision. Patient escorted safety out of the hospital.

## 2021-01-26 NOTE — Progress Notes (Signed)
H/o alcohol use, CAD s/p CABG, last ef 45%, COPD, presents with sob, productive cough, hemoptysis, weakness, falls, hypoxia with new oxygen requirment CTA chest with bilateral opacities   Rapid response this am after he Coughed up large amount of blood, bilateral audible rhonchi , increased oxygen requirement, hold heparin, plavix, npo, critical care consulted.  H/o alcoholism, last egd from 11/2018 did not show varices, change oral protonix to iv protonix, may need GI once respiratory status improved.

## 2021-01-26 NOTE — Progress Notes (Signed)
SLP Cancellation Note  Patient Details Name: Greg [name] MRN: [id number] DOB: [birth year]   Cancelled treatment:       Reason Eval/Treat Not Completed: Medical issues which prohibited therapy - being transferred to ICU with increasing O2 needs and hemoptysis. Per NP note, may require intubation. Will f/u as able.    Osie Bond., M.A. [facility] Pager [phone] Office [phone]  01/26/2021, 1:35 PM

## 2021-01-26 NOTE — H&P (Signed)
History and Physical    Greg [name]:[id number] DOB: [birth year] DOA: 01/25/2021  PCP: Philmore Pali, NP  Patient coming from: Home.  Chief Complaint: Shortness of breath cough and fever.  HPI: Greg [name] is a 50 y.o. male with history of CAD status post CABG and PCI, COPD with ongoing tobacco abuse, prior history of alcohol abuse has not had any alcohol for more than a year has been experiencing weakness shortness of breath and cough productive of sputum for the last 5 days.  Patient also has been feeling weak generally.  He has had at least 2 falls and attempting to walk he feels his lower extremities are weak and giving away in doing so.  Has had intermittent chest pain when he is coughing.  Denies losing consciousness when he fell.  Did not hit his head.  Denies any incontinence of urine or bowels and has good sensation of the lower extremities.  Patient has recently started on Carafate as only new medication is taken.  ED Course: In the ER patient was tachycardic in the 120s with temperature 101.7 F labs showing markedly elevated LFTs with AST 810 ALT 369 total bilirubin 1.5 alkaline phosphatase 232 creatinine was normal lactic acid was 3.1 which further worsened to 4 WBC of 18.4 INR was normal at 1.1 acetaminophen levels negative COVID test negative acute hepatitis panel was negative.  UA and chest x-ray were unremarkable.  CT head was unremarkable.  EKG shows sinus tachycardia.  Patient had blood cultures drawn and started on empiric antibiotics for sepsis unknown cause.  On exam patient does have some weakness of the both lower extremities.  Review of Systems: As per HPI, rest all negative.   Past Medical History:  Diagnosis Date   Anxiety    Asthma    CAD (coronary artery disease) of bypass graft    a. 11/2017 CABG x 3 (LIMA to LAD, SVG->dRCA, SVG->LCX); b. 07/2019 Cath/PCI: LM nl, LAD 50ost, 90p, 57m LCX 90p/m, RCA 30p, 441m80d (2.0x15 Resolute DES), LIMA->mLAD ok,  VG->OM3 40, VG->RPDA 100, EF 60%.   COPD (chronic obstructive pulmonary disease) (HCC)    [place] [facility] [place]   fev1 33% predicted    Daily headache    ETOH abuse    GERD (gastroesophageal reflux disease)    Hyperlipidemia LDL goal <70    Hypertension    [facility]   Skin cancer    "cut off my eyelid and my neck" (11/12/2017) BCC   Tobacco abuse     Past Surgical History:  Procedure Laterality Date   CORONARY ARTERY BYPASS GRAFT N/A 11/21/2017   Procedure: CORONARY ARTERY BYPASS GRAFTING (CABG) times 3 using left internal mammary artery and right greater saphenous vein harvested endscopically. LIMA to LAD, SVG to Distal Right, SVG to Circ.;  Surgeon: GeGrace IsaacMD;  [facility] Service: [facility];  Laterality: N/A;   CORONARY STENT INTERVENTION N/A 07/16/2019   Procedure: CORONARY STENT INTERVENTION;  Surgeon: KeTroy SineMD;  [facility];  Service: Cardiovascular;  Laterality: N/A;   LACERATION REPAIR     "had staples put in my head" (11/12/2017)   LEFT HEART CATH AND CORONARY ANGIOGRAPHY N/A 11/13/2017   Procedure: LEFT HEART CATH AND CORONARY ANGIOGRAPHY;  Surgeon: KeTroy SineMD;  [facility];  Service: Cardiovascular;  Laterality: N/A;   LEFT HEART CATH AND CORS/GRAFTS ANGIOGRAPHY N/A 07/16/2019   Procedure: LEFT  HEART CATH AND CORS/GRAFTS ANGIOGRAPHY;  Surgeon: Troy Sine, MD;  Location: [facility];  Service: Cardiovascular;  Laterality: N/A;   SKIN CANCER EXCISION     "cut off my eyelid and my neck" (11/12/2017)   TEE WITHOUT CARDIOVERSION N/A 11/21/2017   Procedure: TRANSESOPHAGEAL ECHOCARDIOGRAM (TEE);  Surgeon: Grace Isaac, MD;  Location: [place];  Service: Open Heart Surgery;  Laterality: N/A;     reports that he has been smoking cigarettes. He has a 87.50 pack-year smoking history. He has never used smokeless tobacco. He reports current alcohol use of about 84.0  standard drinks per week. He reports that he does not currently use drugs after having used the following drugs: Marijuana.  No Known Allergies  Family History  Problem Relation Age of Onset   CAD Mother    CAD Maternal Grandmother    CAD Maternal Grandfather    CAD Paternal Grandmother    CAD Paternal Grandfather    Colon polyps Neg Hx    Esophageal cancer Neg Hx    Rectal cancer Neg Hx    Stomach cancer Neg Hx     Prior to Admission medications   Medication Sig Start Date End Date Taking? Authorizing Provider  acetaminophen (TYLENOL) 500 MG tablet Take 1,000 mg by mouth every 6 (six) hours as needed for moderate pain or headache.   Yes [provider]  albuterol (VENTOLIN HFA) 108 (90 Base) MCG/ACT inhaler Inhale 2 puffs into the lungs every 6 (six) hours as needed for wheezing or shortness of breath. 07/06/18  Yes Tanda Rockers, MD  ALPRAZolam Duanne Moron) 1 MG tablet Take 1 mg by mouth 3 (three) times daily.    Yes [provider]  ANORO ELLIPTA 62.5-25 MCG/ACT AEPB INHALE 1 PUFF INTO LUNGS EVERY DAY Patient taking differently: Inhale 1 puff into the lungs daily. 12/11/20  Yes Tanda Rockers, MD  aspirin 81 MG tablet Take 1 tablet (81 mg total) by mouth daily. 07/17/19  Yes Theora Gianotti, NP  atorvastatin (LIPITOR) 80 MG tablet Take 1 tablet (80 mg total) by mouth daily at 6 PM. 11/26/17  Yes Harriet Pho, Tessa N, PA-C  clopidogrel (PLAVIX) 75 MG tablet TAKE 1 TABLET(75 MG) BY MOUTH DAILY Patient taking differently: Take 75 mg by mouth daily. 01/22/21  Yes Fay Records, MD  metoprolol tartrate (LOPRESSOR) 25 MG tablet Take 1.5 tablets (37.5 mg total) by mouth 2 (two) times daily. 07/17/19  Yes Theora Gianotti, NP  Multiple Vitamin (MULTIVITAMIN WITH MINERALS) TABS tablet Take 1 tablet by mouth daily.   Yes [provider]  nitroGLYCERIN (NITROSTAT) 0.6 MG SL tablet Place 0.6 mg under the tongue every 5 (five) minutes as needed for chest pain.  01/16/21  Yes [provider]  pantoprazole (PROTONIX) 40 MG tablet Take 1 tablet by mouth once daily 03/14/19  Yes Beavers, Joelene Millin, MD  sucralfate (CARAFATE) 1 g tablet Take 1 g by mouth 4 (four) times daily. 01/08/21  Yes [provider]    Physical Exam: Constitutional: Moderately built and nourished. Vitals:   01/25/21 2345 01/26/21 0015 01/26/21 0150 01/26/21 0219  BP: (!) 127/114 110/75  119/79  Pulse: (!) 121 (!) 121  (!) 111  Resp: 15 14  20   Temp:   (!) 100.4 F (38 C) 99.7 F (37.6 C)  TempSrc:   Oral Oral  SpO2: 95% 96%  96%  Weight:      Height:       Eyes: Anicteric  no pallor. ENMT: No discharge from the ears eyes nose and mouth. Neck: No mass felt.  No neck rigidity. Respiratory: No rhonchi or crepitations. Cardiovascular: S1-S2 heard. Abdomen: Soft nontender bowel sound present. Musculoskeletal: No edema. Skin: No rash. Neurologic: Alert awake oriented to time place and person.  Able to move upper extremities 5 x 5 in lower extremities Alekh 4 x 5.  Has good deep tendon reflexes.  Has good sensation. Psychiatric: Appears normal.  Normal affect.   Labs on Admission: I have personally reviewed following labs and imaging studies  CBC: Recent Labs  Lab 01/25/21 2048  WBC 18.4*  NEUTROABS 14.7*  HGB 12.4*  HCT 37.6*  MCV 92.2  PLT 952*   Basic Metabolic Panel: Recent Labs  Lab 01/25/21 2048  NA 138  K 4.4  CL 98  CO2 28  GLUCOSE 105*  BUN 6  CREATININE 0.75  CALCIUM 8.5*   GFR: Estimated Creatinine Clearance: 102.5 mL/min (by C-G formula based on SCr of 0.75 mg/dL). Liver Function Tests: Recent Labs  Lab 01/25/21 2048  AST 810*  ALT 369*  ALKPHOS 232*  BILITOT 1.5*  PROT 7.0  ALBUMIN 2.3*   No results for input(s): LIPASE, AMYLASE in the last 168 hours. No results for input(s): AMMONIA in the last 168 hours. Coagulation Profile: Recent Labs  Lab 01/25/21 2305  INR 1.1   Cardiac Enzymes: No results for input(s):  CKTOTAL, CKMB, CKMBINDEX, TROPONINI in the last 168 hours. BNP (last 3 results) No results for input(s): PROBNP in the last 8760 hours. HbA1C: No results for input(s): HGBA1C in the last 72 hours. CBG: No results for input(s): GLUCAP in the last 168 hours. Lipid Profile: No results for input(s): CHOL, HDL, LDLCALC, TRIG, CHOLHDL, LDLDIRECT in the last 72 hours. Thyroid Function Tests: No results for input(s): TSH, T4TOTAL, FREET4, T3FREE, THYROIDAB in the last 72 hours. Anemia Panel: No results for input(s): VITAMINB12, FOLATE, FERRITIN, TIBC, IRON, RETICCTPCT in the last 72 hours. Urine analysis:    Component Value Date/Time   COLORURINE AMBER (A) 01/25/2021 2210   APPEARANCEUR HAZY (A) 01/25/2021 2210   LABSPEC 1.026 01/25/2021 2210   PHURINE 6.0 01/25/2021 2210   GLUCOSEU NEGATIVE 01/25/2021 2210   HGBUR MODERATE (A) 01/25/2021 2210   BILIRUBINUR NEGATIVE 01/25/2021 2210   KETONESUR NEGATIVE 01/25/2021 2210   PROTEINUR 100 (A) 01/25/2021 2210   NITRITE NEGATIVE 01/25/2021 2210   LEUKOCYTESUR NEGATIVE 01/25/2021 2210   Sepsis Labs: @LABRCNTIP (procalcitonin:4,lacticidven:4) ) Recent Results (from the past 240 hour(s))  Resp Panel by RT-PCR (Flu A&B, Covid) Nasopharyngeal Swab     Status: None   Collection Time: 01/25/21  8:35 PM   Specimen: Nasopharyngeal Swab; Nasopharyngeal(NP) swabs in vial transport medium  Result Value Ref Range Status   SARS Coronavirus 2 by RT PCR NEGATIVE NEGATIVE Final    Comment: (NOTE) SARS-CoV-2 target nucleic acids are NOT DETECTED.  The SARS-CoV-2 RNA is generally detectable in upper respiratory specimens during the acute phase of infection. The lowest concentration of SARS-CoV-2 viral copies this assay can detect is 138 copies/mL. A negative result does not preclude SARS-Cov-2 infection and should not be used as the sole basis for treatment or other patient management decisions. A negative result may occur with  improper specimen  collection/handling, submission of specimen other than nasopharyngeal swab, presence of viral mutation(s) within the areas targeted by this assay, and inadequate number of viral copies(<138 copies/mL). A negative result must be combined with clinical observations, patient history, and epidemiological information. The  expected result is Negative.  Fact Sheet for Patients:  EntrepreneurPulse.com.au  Fact Sheet for Healthcare Providers:  IncredibleEmployment.be  This test is no t yet approved or cleared by the [place] FDA and  has been authorized for detection and/or diagnosis of SARS-CoV-2 by FDA under an Emergency Use Authorization (EUA). This EUA will remain  in effect (meaning this test can be used) for the duration of the COVID-19 declaration under Section 564(b)(1) of the Act, 21 U.S.C.section 360bbb-3(b)(1), unless the authorization is terminated  or revoked sooner.       Influenza A by PCR NEGATIVE NEGATIVE Final   Influenza B by PCR NEGATIVE NEGATIVE Final    Comment: (NOTE) The Xpert Xpress SARS-CoV-2/FLU/RSV plus assay is intended as an aid in the diagnosis of influenza from Nasopharyngeal swab specimens and should not be used as a sole basis for treatment. Nasal washings and aspirates are unacceptable for Xpert Xpress SARS-CoV-2/FLU/RSV testing.  Fact Sheet for Patients: EntrepreneurPulse.com.au  Fact Sheet for Healthcare Providers: IncredibleEmployment.be  This test is not yet approved or cleared by the [place] FDA and has been authorized for detection and/or diagnosis of SARS-CoV-2 by FDA under an Emergency Use Authorization (EUA). This EUA will remain in effect (meaning this test can be used) for the duration of the COVID-19 declaration under Section 564(b)(1) of the Act, 21 U.S.C. section 360bbb-3(b)(1), unless the authorization is terminated or revoked.  Performed at [facility], [place] [address] [place], [place] [address]      Radiological Exams on Admission: DG Chest 2 View  Result Date: 01/25/2021 CLINICAL DATA:  Shortness of breath. EXAM: CHEST - 2 VIEW COMPARISON:  11/15/2020. FINDINGS: The heart size and mediastinal contours are within normal limits. Sternotomy wires are present over the midline. Both lungs are clear. No acute osseous abnormality. IMPRESSION: No active cardiopulmonary disease. Electronically Signed   By: Brett Fairy M.D.   On: 01/25/2021 21:48   CT HEAD WO CONTRAST (5MM)  Result Date: 01/25/2021 CLINICAL DATA:  Status post trauma. EXAM: CT HEAD WITHOUT CONTRAST TECHNIQUE: Contiguous axial images were obtained from the base of the skull through the vertex without intravenous contrast. COMPARISON:  August 18, 2019 FINDINGS: Brain: There is mild cerebral atrophy with widening of the extra-axial spaces and ventricular dilatation. There are areas of decreased attenuation within the white matter tracts of the supratentorial brain, consistent with microvascular disease changes. Vascular: No hyperdense vessel or unexpected calcification. Skull: Normal. Negative for fracture or focal lesion. Sinuses/Orbits: No acute finding. Other: None. IMPRESSION: 1. Generalized cerebral atrophy. 2. No acute intracranial abnormality. Electronically Signed   By: Virgina Norfolk M.D.   On: 01/25/2021 21:24   US Abdomen Limited  Result Date: 01/26/2021 CLINICAL DATA:  Elevated LFTs. EXAM: ULTRASOUND ABDOMEN LIMITED RIGHT UPPER QUADRANT COMPARISON:  11/15/2020. FINDINGS: Gallbladder: No gallstones or wall thickening visualized. Scattered nonshadowing echogenic foci are noted at the gallbladder wall measuring up to 4 mm, suggesting polyps. No sonographic Murphy sign noted by sonographer. Common bile duct: Diameter: 4.3 mm. Liver: No focal lesion identified. Increased parenchymal echogenicity. Portal vein is patent on color Doppler imaging with normal direction of  blood flow towards the liver. Other: No free fluid. IMPRESSION: 1. Hepatic steatosis. 2. Nonshadowing echogenic foci in the gallbladder measuring up to 4 mm, suggesting polyps. Electronically Signed   By: Brett Fairy M.D.   On: 01/26/2021 00:52    EKG: Independently reviewed.  Sinus tachycardia.  Assessment/Plan Principal Problem:   Sepsis ([place]) Active  Problems:   S/P CABG x 3   GERD (gastroesophageal reflux disease)   CAD (coronary artery disease) of bypass graft   COPD (chronic obstructive pulmonary disease) (HCC)   Tobacco abuse   Elevated LFTs    Sepsis -on arrival patient was tachycardic in the 120s with leukocytosis elevated lactic acid and fever of 101 F.  Meets sepsis criteria.  Cause and source not clear.  Given the patient has markedly elevated LFTs short of breath productive of cough and has abdominal discomfort we will get CT angiogram of the chest abdomen pelvis.  Continue with empiric antibiotics follow cultures. Elevated LFTs with history of steatohepatitis-sonogram of the abdomen shows gallbladder polyps.  We will follow repeat LFTs INR.  Acute hepatitis panel and Tylenol levels were negative.  I have also ordered a CT abdomen pelvis.  Also check CK levels. Weakness of the lower extremities could be from metabolic reasons.  However I did discuss with neurologist we will get MRI of the lumbar spine check CK levels. CAD status post CABG and stenting on Plavix aspirin metoprolol holding statins due to markedly elevated LFTs. COPD not actively wheezing continue inhalers.  Since patient is septic on presentation acutely sick with multiple derangements will need close monitoring for further management inpatient status.   DVT prophylaxis: Heparin. Code Status: Full code. Family Communication: Patient's wife. Disposition Plan: Home. Consults called: Discussed with neurologist. Admission status: Inpatient.   Rise Patience MD [facility] Pager [phone].  If 7PM-7AM, please contact night-coverage [email or web address] Password [facility]  01/26/2021, 2:34 AM

## 2021-01-26 NOTE — ED Provider Notes (Signed)
[facility] EMERGENCY DEPARTMENT Provider Note   CSN: [id number] Arrival date & time: 01/25/21  2022     History Chief Complaint  Patient presents with   Weakness   Shortness of Breath    Greg [name] is a 50 y.o. male.  The history is provided by the patient and the spouse.  Weakness Associated symptoms: shortness of breath   Shortness of Breath He has history of hypertension, hyperlipidemia, coronary artery disease, alcohol abuse, COPD, chronic diarrhea and comes in because of generalized weakness.  He has been feeling weak for the last several days.  He had fallen 4 days ago.  He is also feeling short of breath.  He has had fevers at home as high as 100.5.  Fever started yesterday.  He has had some chills but no sweats.  He has had some nausea but no vomiting.  He has chronic diarrhea which has not changed.  He does have history of GI bleed, but and has not noticed any melena or hematochezia.   Past Medical History:  Diagnosis Date   Anxiety    Asthma    CAD (coronary artery disease) of bypass graft    a. 11/2017 CABG x 3 (LIMA to LAD, SVG->dRCA, SVG->LCX); b. 07/2019 Cath/PCI: LM nl, LAD 50ost, 90p, 35m LCX 90p/m, RCA 30p, 42m80d (2.0x15 Resolute DES), LIMA->mLAD ok, VG->OM3 40, VG->RPDA 100, EF 60%.   COPD (chronic obstructive pulmonary disease) (HCC)    COPD, severe ([facility]   fev1 33% predicted    Daily headache    ETOH abuse    GERD (gastroesophageal reflux disease)    Hyperlipidemia LDL goal <70    Hypertension    [facility]   Skin cancer    "cut off my eyelid and my neck" (11/12/2017) BCC   Tobacco abuse     Patient Active Problem List   Diagnosis Date Noted   CAD (coronary artery disease) of bypass graft    COPD [place] disease) ([facility]   Hypertension    Hyperlipidemia LDL goal <70    Tobacco abuse    ETOH abuse    CAD (coronary artery disease), native coronary artery 07/16/2019    Generalized abdominal pain 01/04/2019   Chronic diarrhea 01/04/2019   GERD (gastroesophageal reflux disease) 01/04/2019   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 LIMA to LAD, SVG to Distal Right, SVG to Circ.;  Surgeon: GeGrace IsaacMD;  [facility]: [facility];  Laterality: N/A;   CORONARY STENT INTERVENTION N/A 07/16/2019   Procedure: CORONARY STENT INTERVENTION;  Surgeon: KeTroy SineMD;  [facility];  Service: Cardiovascular;  Laterality: N/A;   LACERATION REPAIR     "had staples put in my head" (11/12/2017)   LEFT HEART CATH AND CORONARY ANGIOGRAPHY N/A 11/13/2017   Procedure: LEFT HEART CATH AND CORONARY ANGIOGRAPHY;  Surgeon: KeTroy SineMD;  [facility];  Service: Cardiovascular;  Laterality: N/A;   LEFT HEART CATH AND CORS/GRAFTS ANGIOGRAPHY N/A 07/16/2019   Procedure: LEFT HEART CATH AND CORS/GRAFTS ANGIOGRAPHY;  Surgeon: Troy Sine, MD;  Location: [place] [facility];  Service: Cardiovascular;  Laterality: N/A;   SKIN CANCER EXCISION     "cut off my eyelid and my neck" (11/12/2017)   TEE WITHOUT CARDIOVERSION N/A 11/21/2017   Procedure: TRANSESOPHAGEAL ECHOCARDIOGRAM (TEE);  Surgeon: Grace Isaac, MD;  Location: [facility];  Service: Open Heart Surgery;  Laterality: N/A;       Family History  Problem Relation Age of Onset   CAD Mother    CAD Maternal Grandmother    CAD Maternal  Grandfather    CAD Paternal Grandmother    CAD Paternal Grandfather    Colon polyps Neg Hx    Esophageal cancer Neg Hx    Rectal cancer Neg Hx    Stomach cancer Neg Hx     Social History   Tobacco Use   Smoking status: Every Day    Packs/day: 2.50    Years: 35.00    Pack years: 87.50    Types: Cigarettes   Smokeless tobacco: Never  Vaping Use   Vaping Use: Never used  Substance Use Topics   Alcohol use: Yes    Alcohol/week: 84.0 standard drinks    Types: 84 Cans of beer per week    Comment: 3-4 beers daily   Drug use: Not Currently    Types: Marijuana    Comment: 11/12/2017 "qd"    Home Medications Prior to Admission medications   Medication Sig Start Date End Date Taking? Authorizing Provider  acetaminophen (TYLENOL) 500 MG tablet Take 1,000 mg by mouth every 6 (six) hours as needed for moderate pain or headache.    [provider]  albuterol (VENTOLIN HFA) 108 (90 Base) MCG/ACT inhaler Inhale 2 puffs into the lungs every 6 (six) hours as needed for wheezing or shortness of breath. 07/06/18   Tanda Rockers, MD  ALPRAZolam Duanne Moron) 1 MG tablet Take 1 mg by mouth 3 (three) times daily.     [provider]  Celedonio Miyamoto 62.5-25 MCG/ACT AEPB INHALE 1 PUFF INTO LUNGS EVERY DAY 12/11/20   Tanda Rockers, MD  aspirin 81 MG tablet Take 1 tablet (81 mg total) by mouth daily. 07/17/19   Theora Gianotti, NP  atorvastatin (LIPITOR) 80 MG tablet Take 1 tablet (80 mg total) by mouth daily at 6 PM. 11/26/17   Elgie Collard, PA-C  clopidogrel (PLAVIX) 75 MG tablet TAKE 1 TABLET(75 MG) BY MOUTH DAILY 01/22/21   Fay Records, MD  metoprolol tartrate (LOPRESSOR) 25 MG tablet Take 1.5 tablets (37.5 mg total) by mouth 2 (two) times daily. 07/17/19   Theora Gianotti, NP  Multiple Vitamin (MULTIVITAMIN WITH MINERALS) TABS tablet Take 1 tablet by mouth daily.    [provider]  nitroGLYCERIN (NITROSTAT) 0.4 MG SL tablet Place 0.4 mg under the tongue  every 5 (five) minutes as needed for chest pain.  01/15/19   [provider]  pantoprazole (PROTONIX) 40 MG tablet Take 1 tablet by mouth once daily 03/14/19   Thornton Park, MD    Allergies    Patient has no known allergies.  Review of Systems   Review of Systems  Respiratory:  Positive for shortness of breath.   Neurological:  Positive for weakness.  All other systems reviewed and are negative.  Physical Exam Updated Vital Signs BP Marland Kitchen)  127/114    Pulse (!) 121    Temp (!) 101.7 F (38.7 C)    Resp 15    Ht 5' 10"  (1.778 m)    Wt 67.1 kg    SpO2 95%    BMI 21.24 kg/m   Physical Exam Vitals and nursing note reviewed.  50 year old male, resting comfortably and in no acute distress. Vital signs are significant for elevated temperature, heart rate, diastolic blood pressure. Oxygen saturation is 95%, which is normal. Head is normocephalic and atraumatic. PERRLA, EOMI. Oropharynx is clear. Neck is nontender and supple without adenopathy or JVD. Back is nontender and there is no CVA tenderness. Lungs have bibasilar rales, greater on the right.  Generalized rhonchi are also present.  No wheezes are appreciated. Chest is nontender. Heart has regular rate and rhythm without murmur. Abdomen is soft, flat, with mild right upper quadrant tenderness.  Liver does seem to be slightly enlarged, no splenomegaly detected. Extremities have no cyanosis or edema, full range of motion is present. Skin is warm and dry without rash. Neurologic: Mental status is normal, cranial nerves are intact, moves all extremities equally.  ED Results / Procedures / Treatments   Labs (all labs ordered are listed, but only abnormal results are displayed) Labs Reviewed  COMPREHENSIVE METABOLIC PANEL - Abnormal; Notable for the following components:      Result Value   Glucose, Bld 105 (*)    Calcium 8.5 (*)    Albumin 2.3 (*)    AST 810 (*)    ALT 369 (*)    Alkaline Phosphatase 232 (*)    Total  Bilirubin 1.5 (*)    All other components within normal limits  CBC WITH DIFFERENTIAL/PLATELET - Abnormal; Notable for the following components:   WBC 18.4 (*)    RBC 4.08 (*)    Hemoglobin 12.4 (*)    HCT 37.6 (*)    RDW 18.7 (*)    Platelets 432 (*)    Neutro Abs 14.7 (*)    Monocytes Absolute 1.8 (*)    Abs Immature Granulocytes 0.08 (*)    All other components within normal limits  LACTIC ACID, PLASMA - Abnormal; Notable for the following components:   Lactic Acid, Venous 3.1 (*)    All other components within normal limits  LACTIC ACID, PLASMA - Abnormal; Notable for the following components:   Lactic Acid, Venous 4.0 (*)    All other components within normal limits  URINALYSIS, ROUTINE W REFLEX MICROSCOPIC - Abnormal; Notable for the following components:   Color, Urine AMBER (*)    APPearance HAZY (*)    Hgb urine dipstick MODERATE (*)    Protein, ur 100 (*)    All other components within normal limits  ACETAMINOPHEN LEVEL - Abnormal; Notable for the following components:   Acetaminophen (Tylenol), Serum <10 (*)    All other components within normal limits  CBC - Abnormal; Notable for the following components:   WBC 20.0 (*)    RBC 3.25 (*)    Hemoglobin 9.9 (*)    HCT 29.4 (*)    RDW 18.5 (*)    All other components within normal limits  RESP PANEL BY RT-PCR (FLU A&B, COVID) ARPGX2  CULTURE, BLOOD (ROUTINE X 2)  CULTURE, BLOOD (ROUTINE X 2)  URINE CULTURE  PROTIME-INR  APTT  HEPATITIS PANEL, ACUTE  ETHANOL  CREATININE, SERUM  HIV ANTIBODY (ROUTINE TESTING W REFLEX)  LACTIC ACID, PLASMA  LACTIC ACID, PLASMA  BASIC METABOLIC  PANEL  HEPATIC FUNCTION PANEL  CK  CBC WITH DIFFERENTIAL/PLATELET  TROPONIN I (HIGH SENSITIVITY)  TROPONIN I (HIGH SENSITIVITY)    EKG None  Radiology DG Chest 2 View  Result Date: 01/25/2021 CLINICAL DATA:  Shortness of breath. EXAM: CHEST - 2 VIEW COMPARISON:  11/15/2020. FINDINGS: The heart size and mediastinal contours are  within normal limits. Sternotomy wires are present over the midline. Both lungs are clear. No acute osseous abnormality. IMPRESSION: No active cardiopulmonary disease. Electronically Signed   By: Brett Fairy M.D.   On: 01/25/2021 21:48   CT HEAD WO CONTRAST (5MM)  Result Date: 01/25/2021 CLINICAL DATA:  Status post trauma. EXAM: CT HEAD WITHOUT CONTRAST TECHNIQUE: Contiguous axial images were obtained from the base of the skull through the vertex without intravenous contrast. COMPARISON:  August 18, 2019 FINDINGS: Brain: There is mild cerebral atrophy with widening of the extra-axial spaces and ventricular dilatation. There are areas of decreased attenuation within the white matter tracts of the supratentorial brain, consistent with microvascular disease changes. Vascular: No hyperdense vessel or unexpected calcification. Skull: Normal. Negative for fracture or focal lesion. Sinuses/Orbits: No acute finding. Other: None. IMPRESSION: 1. Generalized cerebral atrophy. 2. No acute intracranial abnormality. Electronically Signed   By: Virgina Norfolk M.D.   On: 01/25/2021 21:24    Procedures Procedures  CRITICAL CARE Performed by: Delora Fuel Total critical care time: 55 minutes Critical care time was exclusive of separately billable procedures and treating other patients. Critical care was necessary to treat or prevent imminent or life-threatening deterioration. Critical care was time spent personally by me on the following activities: development of treatment plan with patient and/or surrogate as well as nursing, discussions with consultants, evaluation of patient's response to treatment, examination of patient, obtaining history from patient or surrogate, ordering and performing treatments and interventions, ordering and review of laboratory studies, ordering and review of radiographic studies, pulse oximetry and re-evaluation of patient's condition.  Medications Ordered in ED Medications  lactated  ringers infusion (has no administration in time range)  lactated ringers bolus 1,000 mL (1,000 mLs Intravenous New Bag/Given 01/25/21 2337)    And  lactated ringers bolus 1,000 mL (1,000 mLs Intravenous New Bag/Given 01/25/21 2332)    And  lactated ringers bolus 250 mL (has no administration in time range)  metroNIDAZOLE (FLAGYL) IVPB 500 mg (has no administration in time range)  vancomycin (VANCOREADY) IVPB 1500 mg/300 mL (has no administration in time range)  ceFEPIme (MAXIPIME) 2 g in sodium chloride 0.9 % 100 mL IVPB (has no administration in time range)  vancomycin (VANCOREADY) IVPB 750 mg/150 mL (has no administration in time range)  acetaminophen (TYLENOL) tablet 650 mg (has no administration in time range)  ceFEPIme (MAXIPIME) 2 g in sodium chloride 0.9 % 100 mL IVPB (2 g Intravenous New Bag/Given 01/25/21 2337)    ED Course  I have reviewed the triage vital signs and the nursing notes.  Pertinent labs & imaging results that were available during my care of the patient were reviewed by me and considered in my medical decision making (see chart for details).    MDM Rules/Calculators/A&P                         Fever with generalized weakness and tachycardia concerning for sepsis.  Rales in the lungs are concerning for pneumonia, but chest x-ray shows no evidence of pneumonia.  He was started on the sepsis pathway and started on empiric antibiotics as  well as early goal-directed fluids.  WBC is significantly elevated at 18.4.  Mild thrombocytosis is present which probably represents an acute phase reactant.  There is significant elevation of all liver enzymes, significantly higher than most recent value on record from 01/08/2021.  Along with right upper quadrant tenderness, this is concerning for possible cholecystitis.  Right upper quadrant ultrasound is ordered.  He will need to be admitted.  Case is discussed with Dr. Hal Hope of [facility], who agrees to admit the  patient.  Final Clinical Impression(s) / ED Diagnoses Final diagnoses:  Sepsis due to undetermined organism ([place])  Elevated liver function tests    Rx / DC Orders ED Discharge Orders     None        Delora Fuel, MD 59/97/74 [phone]

## 2021-01-26 NOTE — Progress Notes (Signed)
RT note-Called with Rapid response to assess patient with hemoptysis from coughing/nose bleed. Sp02 now 90% on 4L. Placed on NRB and monitoring. ABG ordered, CCM notified of ABG results given to [facility] Minor NP. Patient will be transferred to ICU.

## 2021-01-26 NOTE — Progress Notes (Signed)
°   01/26/21 1230  Clinical Encounter Type  Visited With Other (Comment) (Spoke via telephone)  Visit Type Initial  Referral From Nurse  Consult/Referral To Chaplain    Chaplain Jorene Guest responded to the consult request for an Advanced Directive. Ike Bene spoke with the patient on the phone and his fiance, [name]. Ike Bene provided A.d. education. The patient stated he is not looking to do an A.D. but wants to do a will. [name] said they would take care of that after discharge. This note was prepared by Jeanine Luz, M.Div..  For questions please contact by phone [phone].

## 2021-01-26 NOTE — Progress Notes (Signed)
Patient is transferring to [facility] needing more care.  Gave report to the staff and answered all his questions.

## 2021-01-26 NOTE — Consult Note (Signed)
NAME:  Greg [name], MRN:  [id number], DOB:  1970-06-28, LOS: 0 ADMISSION DATE:  01/25/2021, CONSULTATION DATE: 01/26/2021 REFERRING MD: Triad, CHIEF COMPLAINT: Hypoxia copious bleeding from nares possible from lungs i.e. hemoptysis.  History of Present Illness:  50 year old former alcoholic who has been dry for approximately 1 year is a heavy smoker and has intermittent hemoptysis over the years.  He is having worsening ability to get around secondary to joint issues.  He has had multiple falls and one occurring 01/25/2022 with injury to the head.  He noticed increasing epistaxis and required a Rhino Rocket in his right nares.  During the night of 01/26/2021 increasing hypoxic respiratory failure along with constant and copious hemorrhage from either nares or hemoptysis.  He is now requiring 70% nonrebreather with O2 sats of 90% in the setting of severe emphysema and COPD.  We will transfer to intensive care unit for further evaluation and treatment and possible intubation.  Pertinent  Medical History   Past Medical History:  Diagnosis Date   Anxiety    Asthma    CAD (coronary artery disease) of bypass graft    a. 11/2017 CABG x 3 (LIMA to LAD, SVG->dRCA, SVG->LCX); b. 07/2019 Cath/PCI: LM nl, LAD 50ost, 90p, 52m LCX 90p/m, RCA 30p, 420m80d (2.0x15 Resolute DES), LIMA->mLAD ok, VG->OM3 40, VG->RPDA 100, EF 60%.   COPD (chronic obstructive pulmonary disease) (HCC)    COPD, severe ([facility]   fev1 33% predicted    Daily headache    ETOH abuse    GERD (gastroesophageal reflux disease)    Hyperlipidemia LDL goal <70    Hypertension    [facility]   Skin cancer    "cut off my eyelid and my neck" (11/12/2017) [place] Tobacco abuse      Significant Hospital Events: Including procedures, antibiotic start and stop dates in addition to other pertinent events   01/26/2021 transferred to ICU  Interim History / Subjective:  [age]ear old male alcoholic quit smoking  approximately 1 year ago chronic hemoptysis chronic epistaxis comes in status post fall with increasing and copious blood from nares and mouth suspected from postnasal drip of nasal bleed.  Developed worsening hypoxia with increased copious hemoptysis.  Will be transferred to intensive care unit  Objective   Blood pressure 104/74, pulse 62, temperature 100.1 F (37.8 C), temperature source Oral, resp. rate 18, height 5' 9"  (1.753 m), weight 67.1 kg, SpO2 94 %.        Intake/Output Summary (Last 24 hours) at 01/26/2021 1120 Last data filed at 01/26/2021 [id number] per 24 hour  Intake 300 ml  Output 700 ml  Net -400 ml   Filed Weights   01/25/21 2026 01/26/21 0219 01/26/21 0545  Weight: 67.1 kg 68.7 kg 67.1 kg    Examination: General: Frail appearing male on nonrebreather mask but able to carry on sentences without difficulty HENT: Rhino Rocket noted in right nares Lungs: Coarse rhonchi throughout Cardiovascular: Heart sounds are distant Abdomen: Soft positive bowel sounds Extremities: Warm and dry Neuro: Grossly intact without focal defect [facility]     Assessment & Plan:  Acute on chronic hypoxic respiratory failure in the setting of severe emphysema, continued tobacco abuse, complicated by hemoptysis epistaxis 1 with trauma from fall with injury to face.  Is required increasing oxygen up to nonrebreather 70% with sats of 90%.  He does have severe underlying emphysema. Transfer to intensive care unit Intubation may be  required Stabilization of bleeding from nares, may need fiberoptic bronchoscopy to evaluate hemoptysis and he is positive for GI bleed. Monitor H&H Bronchodilator O2 as needed Stop Plavix and aspirin May need ENT evaluation of epistaxis  History liver dysfunction secondary to chronic alcohol abuse with cessation approximately 1 year ago. May need GI consult in the future.  Suspected sepsis with elevated lactic acid Check  procalcitonin Monitor lactic Empirical antibiotic Panculture     Elevated lactic acid which is down from 2.3-4.0 Monitor lactic at  History of coronary artery disease status post CABG Monitor off aspirin and Plavix   Best Practice (right click and "Reselect all SmartList Selections" daily)   Diet/type: NPO DVT prophylaxis: not indicated GI prophylaxis: PPI Lines: N/A Foley:  N/A Code Status:  full code Last date of multidisciplinary goals of care discussion [tbd]  Labs   CBC: Recent Labs  Lab 01/25/21 2048 01/26/21 0401 01/26/21 0857  WBC 18.4* 20.0* 20.0*  NEUTROABS 14.7*  --  15.4*  HGB 12.4* 9.9* 10.2*  HCT 37.6* 29.4* 30.3*  MCV 92.2 90.5 91.3  PLT 432* 315 824    Basic Metabolic Panel: Recent Labs  Lab 01/25/21 2048 01/26/21 0401 01/26/21 0857  NA 138  --  134*  K 4.4  --  3.4*  CL 98  --  100  CO2 28  --  27  GLUCOSE 105*  --  115*  BUN 6  --  6  CREATININE 0.75 0.71 0.73  CALCIUM 8.5*  --  7.9*   GFR: Estimated Creatinine Clearance: 102.5 mL/min (by C-G formula based on SCr of 0.73 mg/dL). Recent Labs  Lab 01/25/21 2048 01/25/21 2305 01/26/21 0018 01/26/21 0401 01/26/21 0857  WBC 18.4*  --   --  20.0* 20.0*  LATICACIDVEN  --  3.1* 4.0*  --  2.3*    Liver Function Tests: Recent Labs  Lab 01/25/21 2048 01/26/21 0857  AST 810* 817*  ALT 369* 352*  ALKPHOS 232* 196*  BILITOT 1.5* 1.6*  PROT 7.0 5.9*  ALBUMIN 2.3* 1.9*   No results for input(s): LIPASE, AMYLASE in the last 168 hours. No results for input(s): AMMONIA in the last 168 hours.  ABG    Component Value Date/Time   PHART 7.313 (L) 11/21/2017 1743   PCO2ART 43.8 11/21/2017 1743   PO2ART 81.0 (L) 11/21/2017 1743   HCO3 22.2 11/21/2017 1743   TCO2 25 11/22/2017 1635   ACIDBASEDEF 4.0 (H) 11/21/2017 1743   O2SAT 60.6 11/23/2017 1959     Coagulation Profile: Recent Labs  Lab 01/25/21 2305  INR 1.1    Cardiac Enzymes: No results for input(s): CKTOTAL, CKMB,  CKMBINDEX, TROPONINI in the last 168 hours.  HbA1C: Hgb A1c MFr Bld  Date/Time Value Ref Range Status  11/19/2017 03:33 AM 5.5 4.8 - 5.6 % Final    Comment:    (NOTE) Pre diabetes:          5.7%-6.4% Diabetes:              >6.4% Glycemic control for   <7.0% adults with diabetes     CBG: No results for input(s): GLUCAP in the last 168 hours.  Review of Systems:   10 point review of system taken, please see HPI for positives and negatives.   Past Medical History:  He,  has a past medical history of Anxiety, Asthma, CAD (coronary artery disease) of bypass graft, COPD (chronic obstructive pulmonary disease) ([place]), COPD, severe ([place]) (11/19/2017), Daily headache, ETOH abuse, GERD (  gastroesophageal reflux disease), Hyperlipidemia LDL goal <70, Hypertension, Myocardial infarction [facility]), Skin cancer, and Tobacco abuse.   Surgical History:   Past Surgical History:  Procedure Laterality Date   CORONARY ARTERY BYPASS GRAFT N/A 11/21/2017   Procedure: CORONARY ARTERY BYPASS GRAFTING (CABG) times 3 using left internal mammary artery and right greater saphenous vein harvested endscopically. LIMA to LAD, SVG to Distal Right, SVG to Circ.;  Surgeon: Grace Isaac, MD;  Location: [place];  Service: Open Heart Surgery;  Laterality: N/A;   CORONARY STENT INTERVENTION N/A 07/16/2019   Procedure: CORONARY STENT INTERVENTION;  Surgeon: Troy Sine, MD;  Location: [place] CV LAB;  Service: Cardiovascular;  Laterality: N/A;   LACERATION REPAIR     "had staples put in my head" (11/12/2017)   LEFT HEART CATH AND CORONARY ANGIOGRAPHY N/A 11/13/2017   Procedure: LEFT HEART CATH AND CORONARY ANGIOGRAPHY;  Surgeon: Troy Sine, MD;  Location: [place] CV LAB;  Service: Cardiovascular;  Laterality: N/A;   LEFT HEART CATH AND CORS/GRAFTS ANGIOGRAPHY N/A 07/16/2019   Procedure: LEFT HEART CATH AND CORS/GRAFTS ANGIOGRAPHY;  Surgeon: Troy Sine, MD;  Location: [place] CV LAB;  Service:  Cardiovascular;  Laterality: N/A;   SKIN CANCER EXCISION     "cut off my eyelid and my neck" (11/12/2017)   TEE WITHOUT CARDIOVERSION N/A 11/21/2017   Procedure: TRANSESOPHAGEAL ECHOCARDIOGRAM (TEE);  Surgeon: Grace Isaac, MD;  Location: [place];  Service: Open Heart Surgery;  Laterality: N/A;     Social History:   reports that he has been smoking cigarettes. He has a 87.50 pack-year smoking history. He has never used smokeless tobacco. He reports current alcohol use of about 84.0 standard drinks per week. He reports that he does not currently use drugs after having used the following drugs: Marijuana.   Family History:  His family history includes CAD in his maternal grandfather, maternal grandmother, mother, paternal grandfather, and paternal grandmother. There is no history of Colon polyps, Esophageal cancer, Rectal cancer, or Stomach cancer.   Allergies No Known Allergies   Home Medications  Prior to Admission medications   Medication Sig Start Date End Date Taking? Authorizing Provider  acetaminophen (TYLENOL) 500 MG tablet Take 1,000 mg by mouth every 6 (six) hours as needed for moderate pain or headache.   Yes [provider]  albuterol (VENTOLIN HFA) 108 (90 Base) MCG/ACT inhaler Inhale 2 puffs into the lungs every 6 (six) hours as needed for wheezing or shortness of breath. 07/06/18  Yes Tanda Rockers, MD  ALPRAZolam Duanne Moron) 1 MG tablet Take 1 mg by mouth 3 (three) times daily.    Yes [provider]  ANORO ELLIPTA 62.5-25 MCG/ACT AEPB INHALE 1 PUFF INTO LUNGS EVERY DAY Patient taking differently: Inhale 1 puff into the lungs daily. 12/11/20  Yes Tanda Rockers, MD  aspirin 81 MG tablet Take 1 tablet (81 mg total) by mouth daily. 07/17/19  Yes Theora Gianotti, NP  atorvastatin (LIPITOR) 80 MG tablet Take 1 tablet (80 mg total) by mouth daily at 6 PM. 11/26/17  Yes Harriet Pho, Tessa N, PA-C  clopidogrel (PLAVIX) 75 MG tablet TAKE 1 TABLET(75 MG) BY MOUTH  DAILY Patient taking differently: Take 75 mg by mouth daily. 01/22/21  Yes Fay Records, MD  metoprolol tartrate (LOPRESSOR) 25 MG tablet Take 1.5 tablets (37.5 mg total) by mouth 2 (two) times daily. 07/17/19  Yes Theora Gianotti, NP  Multiple Vitamin (MULTIVITAMIN WITH MINERALS) TABS tablet Take 1  tablet by mouth daily.   Yes [provider]  nitroGLYCERIN (NITROSTAT) 0.6 MG SL tablet Place 0.6 mg under the tongue every 5 (five) minutes as needed for chest pain. 01/16/21  Yes [provider]  pantoprazole (PROTONIX) 40 MG tablet Take 1 tablet by mouth once daily 03/14/19  Yes Beavers, Joelene Millin, MD  sucralfate (CARAFATE) 1 g tablet Take 1 g by mouth 4 (four) times daily. 01/08/21  Yes [provider]     Critical care time: 45 min    Richardson Landry Liona Wengert ACNP Acute Care Nurse Practitioner [place] Please consult Amion 01/26/2021, 11:20 AM

## 2021-01-27 LAB — URINE CULTURE

## 2021-01-29 LAB — GLUCOSE, CAPILLARY: Glucose-Capillary: 107 mg/dL — ABNORMAL HIGH (ref 70–99)

## 2021-01-30 LAB — CULTURE, BLOOD (ROUTINE X 2)
Culture: NO GROWTH
Culture: NO GROWTH
Special Requests: ADEQUATE
Special Requests: ADEQUATE

## 2021-02-14 NOTE — Telephone Encounter (Signed)
Pt cancelled his appt for 02/15/21. Pt states they have to call back for appt. I will update the requesting office.

## 2021-02-15 ENCOUNTER — Ambulatory Visit (HOSPITAL_BASED_OUTPATIENT_CLINIC_OR_DEPARTMENT_OTHER): Payer: Medicaid Other | Admitting: Family

## 2021-03-09 NOTE — Progress Notes (Signed)
Office Visit    Patient Name: Greg [name] Date of Encounter: 03/12/2021  PCP:  Greig Right, [facility]  Cardiologist:  Dorris Carnes, MD  Advanced Practice Provider:  No care team member to display Electrophysiologist:  None   Chief Complaint    Greg [name] is a 51 y.o. male with a hx of CAD (status post CABG in 2018), HTN, hyperlipidemia, tobacco abuse, COPD Presents today for pre-surgical clearance.   He recently underwent a left heart catheterization which revealed 2 out of 3 grafts from his previous CABG patent.  SVG to PDA was occluded.  LIMA to LAD was patent, SVG to OM 3 was patent.  And underwent DES to the RCA.  Plan was DAPT for at least 6 months.  He was last seen 02/14/2020 and had underwent a liver biopsy.  His breathing was stable.  He denied any chest pain.  He did have some shortness of breath but thought this was likely due to his lungs.  He continues to drink about 2 beers a day.  He presents today for preop clearance for colonoscopy.  Dr. Harrington Challenger stated it was okay to hold Plavix however, given severe disease would like it restarted when safe after his procedure.  He has severe COPD and states short of breath.  He does not seem very well controlled on his current medication regimen.  He just changed primary care's and has an appointment on Monday.  He states he has not had any chest pain.  However, his feet started to swell about a week ago.  He does tend to eat a lot of fast food and only eats 1 time a day.  He is currently undergoing a work-up for a GI bleed hence why he is having his colonoscopy done.  He did state that he had a feeling like somebody grabbed his heart for a few seconds and it happened with exertion.  This is only happened once or twice since his stent procedure.  On December 22 he was in the hospital because his legs gave out.  He was told that he was septic and started on antibiotics for suspected pneumonia.  He  ended up leaving hospital AMA.  He gets around with a cane at home and can walk around his house but does have to take several breaks.  He continues to smoke 1 to 2 cigarettes a day even with his severe shortness of breath.  Reports no chest pain, pressure, or tightness. No edema, orthopnea, PND. Reports no palpitations.    Past Medical History    Past Medical History:  Diagnosis Date   Anxiety    Asthma    CAD (coronary artery disease) of bypass graft    a. 11/2017 CABG x 3 (LIMA to LAD, SVG->dRCA, SVG->LCX); b. 07/2019 Cath/PCI: LM nl, LAD 50ost, 90p, 90m LCX 90p/m, RCA 30p, 448m80d (2.0x15 Resolute DES), LIMA->mLAD ok, VG->OM3 40, VG->RPDA 100, EF 60%.   COPD (chronic obstructive pulmonary disease) (HCC)    [facility]D[place] [facility] [place]   fev1 33% predicted    Daily headache    ETOH abuse    GERD (gastroesophageal reflux disease)    Hyperlipidemia LDL goal <70    Hypertension    [facility]   Skin cancer    "cut off my eyelid and my neck" (11/12/2017) [facility]   Tobacco abuse    Past Surgical History:  Procedure Laterality Date   CORONARY  ARTERY BYPASS GRAFT N/A 11/21/2017   Procedure: CORONARY ARTERY BYPASS GRAFTING (CABG) times 3 using left internal mammary artery and right greater saphenous vein harvested endscopically. LIMA to LAD, SVG to Distal Right, SVG to Circ.;  Surgeon: Grace Isaac, MD;  Location: [place];  Service: Open Heart Surgery;  Laterality: N/A;   CORONARY STENT INTERVENTION N/A 07/16/2019   Procedure: CORONARY STENT INTERVENTION;  Surgeon: Troy Sine, MD;  Location: [place] CV LAB;  Service: Cardiovascular;  Laterality: N/A;   LACERATION REPAIR     "had staples put in my head" (11/12/2017)   LEFT HEART CATH AND CORONARY ANGIOGRAPHY N/A 11/13/2017   Procedure: LEFT HEART CATH AND CORONARY ANGIOGRAPHY;  Surgeon: Troy Sine, MD;  Location: [place] CV LAB;  Service: Cardiovascular;  Laterality: N/A;   LEFT HEART CATH AND  CORS/GRAFTS ANGIOGRAPHY N/A 07/16/2019   Procedure: LEFT HEART CATH AND CORS/GRAFTS ANGIOGRAPHY;  Surgeon: Troy Sine, MD;  Location: [place] CV LAB;  Service: Cardiovascular;  Laterality: N/A;   SKIN CANCER EXCISION     "cut off my eyelid and my neck" (11/12/2017)   TEE WITHOUT CARDIOVERSION N/A 11/21/2017   Procedure: TRANSESOPHAGEAL ECHOCARDIOGRAM (TEE);  Surgeon: Grace Isaac, MD;  Location: [place];  Service: Open Heart Surgery;  Laterality: N/A;    Allergies  No Known Allergies    EKGs/Labs/Other Studies Reviewed:   The following studies were reviewed today:  Echocardiogram 11/13/2017  Study Conclusions   - Left ventricle: The cavity size was normal. Wall thickness was    normal. Systolic function was normal. The estimated ejection    fraction was in the range of 60% to 65%. Wall motion was normal;    there were no regional wall motion abnormalities. Doppler    parameters are consistent with abnormal left ventricular    relaxation (grade 1 diastolic dysfunction). The E/e&' ratio is <8,    suggesting normal LV filling pressure.  - Left atrium: The atrium was normal in size.  - Inferior vena cava: The vessel was normal in size. The    respirophasic diameter changes were in the normal range (>= 50%),    consistent with normal central venous pressure.   Impressions:   - LVEF 60-65%, normal wall thickness, normal wall motion, grade 1    DD, normal LV filling pressure, normal LA size, normal IVC.   Cardiac catheterization performed 07/16/2019   Ost LAD lesion is 50% stenosed. Mid LAD lesion is 80% stenosed. Dist RCA lesion is 80% stenosed. Prox RCA lesion is 30% stenosed. Prox RCA to Mid RCA lesion is 40% stenosed. Mid RCA lesion is 40% stenosed. Prox Cx to Mid Cx lesion is 90% stenosed. Origin lesion is 100% stenosed. Post intervention, there is a 0% residual stenosis. Prox LAD lesion is 90% stenosed. The left ventricular systolic function is normal. LV  end diastolic pressure is moderately elevated. Dist Graft to Insertion lesion is 40% stenosed. A stent was successfully placed.   Severe multivessel native coronary obstructive disease with 50% proximal 90% proximal and 80% stenosis after the diagonal vessel with competitive filling via the LIMA graft; 90% proximal circumflex stenosis with competitive filling due to the vein graft; and RCA with 30%, 40% and 40% proximal to mid stenosis with 80% mid distal stenosis prior to the PDA and PLA takeoff for competitive filling.   Patent LIMA graft supplying the mid LAD.   Patent SVG supplying the circumflex marginal vessel but with evidence for 40% distal graft  stenosis.   Occluded vein graft which had supplied the distal RCA.   Function with EF estimated 60%; LVEDP elevated at 24 mm.   Successful PCI to the RCA with ultimate insertion of a 2.0 x 15 mm Resolute Onyx stent postdilated to 2.20 mm with the 80% stenosis being reduced to 0%.   RECOMMENDATION: DAPT for minimum of 6 months but probably longer. Smoking cessation is essential.  Medical therapy for concomitant CAD. Aggressive lipid-lowering therapy with target LDL less than 70.  EKG:  EKG is not ordered today.   Recent Labs: 01/26/2021: ALT 352; BUN 6; Creatinine, Ser 0.73; Hemoglobin 9.7; Platelets 330; Potassium 3.4; Sodium 134  Recent Lipid Panel    Component Value Date/Time   CHOL 221 (H) 10/04/2019 1442   TRIG 1,254 (HH) 10/04/2019 1442   HDL 34 (L) 10/04/2019 1442   CHOLHDL 6.5 (H) 10/04/2019 1442   CHOLHDL 2.7 11/14/2017 2304   VLDL 15 11/14/2017 2304   LDLCALC Comment (A) 10/04/2019 1442     Home Medications   Current Meds  Medication Sig   acetaminophen (TYLENOL) 500 MG tablet Take 1,000 mg by mouth every 6 (six) hours as needed for moderate pain or headache.   albuterol (VENTOLIN HFA) 108 (90 Base) MCG/ACT inhaler Inhale 2 puffs into the lungs every 6 (six) hours as needed for wheezing or shortness of breath.    ALPRAZolam (XANAX) 1 MG tablet Take 1 mg by mouth 3 (three) times daily.    ANORO ELLIPTA 62.5-25 MCG/ACT AEPB INHALE 1 PUFF INTO LUNGS EVERY DAY   aspirin 81 MG tablet Take 1 tablet (81 mg total) by mouth daily.   atorvastatin (LIPITOR) 80 MG tablet Take 1 tablet (80 mg total) by mouth daily at 6 PM.   clopidogrel (PLAVIX) 75 MG tablet TAKE 1 TABLET(75 MG) BY MOUTH DAILY   furosemide (LASIX) 40 MG tablet Take 1 tablet (40 mg total) by mouth daily for 3 days.   metoprolol tartrate (LOPRESSOR) 25 MG tablet Take 1.5 tablets (37.5 mg total) by mouth 2 (two) times daily.   Multiple Vitamin (MULTIVITAMIN WITH MINERALS) TABS tablet Take 1 tablet by mouth daily.   nitroGLYCERIN (NITROSTAT) 0.6 MG SL tablet Place 0.6 mg under the tongue every 5 (five) minutes as needed for chest pain.   pantoprazole (PROTONIX) 40 MG tablet Take 1 tablet by mouth once daily   sucralfate (CARAFATE) 1 g tablet Take 1 g by mouth 4 (four) times daily.     Review of Systems      All other systems reviewed and are otherwise negative except as noted above.  Physical Exam    VS:  BP 130/78    Pulse 91    Ht 5' 10.5" (1.791 m)    Wt 148 lb (67.1 kg)    SpO2 96%    BMI 20.94 kg/m  , BMI Body mass index is 20.94 kg/m.  Wt Readings from Last 3 Encounters:  03/12/21 148 lb (67.1 kg)  01/26/21 147 lb 14.9 oz (67.1 kg)  02/14/20 152 lb (68.9 kg)     GEN: Well nourished, well developed, in no acute distress. HEENT: normal. Neck: Supple, no JVD, carotid bruits, or masses. Cardiac: RRR, no murmurs, rubs, or gallops. No clubbing, cyanosis, 1+ pitting lower extremity edema.  Radials/PT 2+ and equal bilaterally.  Respiratory:  Respirations regular and unlabored, clear to auscultation bilaterally. GI: Soft, nontender, nondistended. MS: No deformity or atrophy. Skin: Warm and dry, no rash. Neuro:  Strength and sensation are  intact. Psych: Normal affect.  Assessment & Plan    Preop Clearance    Mr. Rizzi perioperative  risk of a major cardiac event is 6.6% according to the Revised Cardiac Risk Index (RCRI).  Therefore, he is at high risk for perioperative complications.   His functional capacity is good at 4.64 METs according to the Duke Activity Status Index (DASI). Recommendations: According to ACC/AHA guidelines, no further cardiovascular testing needed.  The patient may proceed to surgery at acceptable risk.   Antiplatelet and/or Anticoagulation Recommendations: Clopidogrel (Plavix) can be held for 5 days prior to his surgery and resumed as soon as possible post op.   CAD s/p CABG x 3 and PCI in 2021 -No chest pain however describes a grabbing in his chest that lasts seconds and does not happen often -Okay to hold Plavix for procedure for 5 days but resume as soon as possible immediately following procedure   Hypertension -BP well controlled today in the clinic -Well controlled at home per his wife  Hyperlipidemia -Last Lipid panel 2021, will re-order today  Abnormal LFTs -Will order a CMET to evaluate -Remains on Lipitor 35m daily  Alcohol abuse -no drinking anymore  COPD/Smoking -Uncontrolled COPD on current inhaled medications  -SOB with even mild exertion  -He has an appointment on Monday with his PCP -1 or 2 cigarettes a day, cessation advised  8. Lower extremity edema -This started about a week ago -1+ pitting edema  -Will order lasix x 3 days and draw a CMET next week.  -Will also order a BNP -If lower extremity edema does not improve and BNP is elevated, would consider an Echocardiogram -His last Echo in 2021 showed normal EF and grade 1 DD -Discussed a low sodium diet, he does confirm that he eats a lot of fast food, education provided today   Disposition: Follow up 3 months with PDorris Carnes MD or APP.  Signed, TElgie Collard PA-C 03/12/2021, 4:17 PM [facility]

## 2021-03-12 ENCOUNTER — Ambulatory Visit (INDEPENDENT_AMBULATORY_CARE_PROVIDER_SITE_OTHER): Payer: Medicaid Other | Admitting: Physician Assistant

## 2021-03-12 ENCOUNTER — Encounter (HOSPITAL_BASED_OUTPATIENT_CLINIC_OR_DEPARTMENT_OTHER): Payer: Self-pay | Admitting: Physician Assistant

## 2021-03-12 ENCOUNTER — Other Ambulatory Visit: Payer: Self-pay

## 2021-03-12 VITALS — BP 130/78 | HR 91 | Ht 70.5 in | Wt 148.0 lb

## 2021-03-12 DIAGNOSIS — E785 Hyperlipidemia, unspecified: Secondary | ICD-10-CM

## 2021-03-12 DIAGNOSIS — R7989 Other specified abnormal findings of blood chemistry: Secondary | ICD-10-CM

## 2021-03-12 DIAGNOSIS — R0602 Shortness of breath: Secondary | ICD-10-CM

## 2021-03-12 DIAGNOSIS — Z951 Presence of aortocoronary bypass graft: Secondary | ICD-10-CM | POA: Diagnosis not present

## 2021-03-12 DIAGNOSIS — R600 Localized edema: Secondary | ICD-10-CM

## 2021-03-12 DIAGNOSIS — I10 Essential (primary) hypertension: Secondary | ICD-10-CM

## 2021-03-12 DIAGNOSIS — F1010 Alcohol abuse, uncomplicated: Secondary | ICD-10-CM

## 2021-03-12 MED ORDER — FUROSEMIDE 40 MG PO TABS: 40.0000 mg | ORAL_TABLET | Freq: Every day | ORAL | 0 refills | Status: DC

## 2021-03-12 MED ORDER — POTASSIUM CHLORIDE CRYS ER 20 MEQ PO TBCR
20.0000 meq | EXTENDED_RELEASE_TABLET | Freq: Every day | ORAL | 0 refills | Status: DC
Start: 2021-03-12 — End: 2021-11-21

## 2021-03-12 NOTE — Patient Instructions (Signed)
Medication Instructions:  Your physician has recommended you make the following change in your medication:   Start:  Lasix 34m daily for 3 days   *If you need a refill on your cardiac medications before your next appointment, please call your pharmacy*   Lab Work: Please return for Lab work next week for Fasting Lipid Panel, CBC, BNP, CMET. You may come to the...   [facility] (3rd floor) [address] [id number]  Open: 8am-Noon and 1pm-4:30pm   [place]  **no appointments needed**  If you have labs (blood work) drawn today and your tests are completely normal, you will receive your results only by: MRaytheon(if you have MyChart) OR A paper copy in the mail If you have any lab test that is abnormal or we need to change your treatment, we will call you to review the results.   Testing/Procedures: You are cleared for your Colonoscopy. TNicholes Rough PA will send your clearance!    Follow-Up: [facility] needs [facility] our [facility].  As part of our continuing mission to provide you with exceptional heart care, we have created designated Provider Care Teams.  These Care Teams include your primary Cardiologist (physician) and Advanced Practice Providers (APPs -  Physician Assistants and Nurse Practitioners) who all work together to provide you with the care you need, when you need it.  We recommend signing up for the patient portal called "MyChart".  Sign up information is provided on this After Visit Summary.  MyChart is used to connect with patients for Virtual Visits (Telemedicine).  Patients are able to view lab/test results, encounter notes, upcoming appointments, etc.  Non-urgent messages can be sent to your provider as well.   To learn more about what you can do with MyChart, go to [email or web address]    Your next appointment:    3 month(s)  The format for your next appointment:   In Person  Provider:   PDorris Carnes MD  or APP{  Other Instructions Recommend weighing daily and keeping a log. Please call our office if you have weight gain of 2 pounds overnight or 5 pounds in 1 week.   Date  Time Weight                                           Heart-Healthy Eating Plan Heart-healthy meal planning includes: Eating less unhealthy fats. Eating more healthy fats. Making other changes in your diet. Talk with your doctor or a diet specialist (dietitian) to create an eating plan that is right for you. What is my plan? Your doctor may recommend an eating plan that includes: Total fat: ______% or less of total calories a day. Saturated fat: ______% or less of total calories a day. Cholesterol: less than _________mg a day. What are tips for following this plan? Cooking Avoid frying your food. Try to bake, boil, grill, or broil it instead. You can also reduce fat by: Removing the skin from poultry. Removing all visible fats from meats. Steaming vegetables in water or broth. Meal planning  At meals, divide your plate into four equal parts: Fill one-half of your plate with vegetables and green salads. Fill one-fourth of your plate with whole grains. Fill one-fourth of your plate with lean protein foods. Eat  4-5 servings of vegetables per day. A serving of vegetables is: 1 cup of raw or cooked vegetables. 2 cups of raw leafy greens. Eat 4-5 servings of fruit per day. A serving of fruit is: 1 medium whole fruit.  cup of dried fruit.  cup of fresh, frozen, or canned fruit.  cup of 100% fruit juice. Eat more foods that have soluble fiber. These are apples, broccoli, carrots, beans, peas, and barley. Try to get 20-30 g of fiber per day. Eat 4-5 servings of nuts, legumes, and seeds per week: 1 serving of dried beans or legumes equals  cup after being cooked. 1 serving of nuts is  cup. 1  serving of seeds equals 1 tablespoon. General information Eat more home-cooked food. Eat less restaurant, buffet, and fast food. Limit or avoid alcohol. Limit foods that are high in starch and sugar. Avoid fried foods. Lose weight if you are overweight. Keep track of how much salt (sodium) you eat. This is important if you have high blood pressure. Ask your doctor to tell you more about this. Try to add vegetarian meals each week. Fats Choose healthy fats. These include olive oil and canola oil, flaxseeds, walnuts, almonds, and seeds. Eat more omega-3 fats. These include salmon, mackerel, sardines, tuna, flaxseed oil, and ground flaxseeds. Try to eat fish at least 2 times each week. Check food labels. Avoid foods with trans fats or high amounts of saturated fat. Limit saturated fats. These are often found in animal products, such as meats, butter, and cream. These are also found in plant foods, such as palm oil, palm kernel oil, and coconut oil. Avoid foods with partially hydrogenated oils in them. These have trans fats. Examples are stick margarine, some tub margarines, cookies, crackers, and other baked goods. What foods can I eat? Fruits All fresh, canned (in natural juice), or frozen fruits. Vegetables Fresh or frozen vegetables (raw, steamed, roasted, or grilled). Green salads. Grains Most grains. Choose whole wheat and whole grains most of the time. Rice and pasta, including brown rice and pastas made with whole wheat. Meats and other proteins Lean, well-trimmed beef, veal, pork, and lamb. Chicken and [place] without skin. All fish and shellfish. Wild duck, rabbit, pheasant, and venison. Egg whites or low-cholesterol egg substitutes. Dried beans, peas, lentils, and tofu. Seeds and most nuts. Dairy Low-fat or nonfat cheeses, including ricotta and mozzarella. Skim or 1% milk that is liquid, powdered, or evaporated. Buttermilk that is made with low-fat milk. Nonfat or low-fat  yogurt. Fats and oils Non-hydrogenated (trans-free) margarines. Vegetable oils, including soybean, sesame, sunflower, olive, peanut, safflower, corn, canola, and cottonseed. Salad dressings or mayonnaise made with a vegetable oil. Beverages Mineral water. Coffee and tea. Diet carbonated beverages. Sweets and desserts Sherbet, gelatin, and fruit ice. Small amounts of dark chocolate. Limit all sweets and desserts. Seasonings and condiments All seasonings and condiments. The items listed above may not be a complete list of foods and drinks you can eat. Contact a dietitian for more options. What foods should I avoid? Fruits Canned fruit in heavy syrup. Fruit in cream or butter sauce. Fried fruit. Limit coconut. Vegetables Vegetables cooked in cheese, cream, or butter sauce. Fried vegetables. Grains Breads that are made with saturated or trans fats, oils, or whole milk. Croissants. Sweet rolls. Donuts. High-fat crackers, such as cheese crackers. Meats and other proteins Fatty meats, such as hot dogs, ribs, sausage, bacon, rib-eye roast or steak. High-fat deli meats, such as salami and bologna. Caviar. Domestic duck and goose.  Organ meats, such as liver. Dairy Cream, sour cream, cream cheese, and creamed cottage cheese. Whole-milk cheeses. Whole or 2% milk that is liquid, evaporated, or condensed. Whole buttermilk. Cream sauce or high-fat cheese sauce. Yogurt that is made from whole milk. Fats and oils Meat fat, or shortening. Cocoa butter, hydrogenated oils, palm oil, coconut oil, palm kernel oil. Solid fats and shortenings, including bacon fat, salt pork, lard, and butter. Nondairy cream substitutes. Salad dressings with cheese or sour cream. Beverages Regular sodas and juice drinks with added sugar. Sweets and desserts Frosting. Pudding. Cookies. Cakes. Pies. Milk chocolate or white chocolate. Buttered syrups. Full-fat ice cream or ice cream drinks. The items listed above may not be a  complete list of foods and drinks to avoid. Contact a dietitian for more information. Summary Heart-healthy meal planning includes eating less unhealthy fats, eating more healthy fats, and making other changes in your diet. Eat a balanced diet. This includes fruits and vegetables, low-fat or nonfat dairy, lean protein, nuts and legumes, whole grains, and heart-healthy oils and fats. This information is not intended to replace advice given to you by your health care provider. Make sure you discuss any questions you have with your health care provider. Document Revised: 06/01/2020 Document Reviewed: 06/01/2020 Elsevier Patient Education  2022 [employer].

## 2021-03-13 ENCOUNTER — Other Ambulatory Visit (HOSPITAL_BASED_OUTPATIENT_CLINIC_OR_DEPARTMENT_OTHER): Payer: Self-pay | Admitting: Physician Assistant

## 2021-03-19 ENCOUNTER — Encounter (HOSPITAL_BASED_OUTPATIENT_CLINIC_OR_DEPARTMENT_OTHER): Payer: Self-pay

## 2021-03-26 ENCOUNTER — Telehealth: Payer: Self-pay | Admitting: *Deleted

## 2021-03-26 NOTE — Telephone Encounter (Signed)
° °  Pre-operative Risk Assessment    Patient Name: Greg [name]  DOB: Jan 06, 1971 MRN: [id number]      Request for Surgical Clearance    Procedure:   COLON/EGD  Date of Surgery:  Clearance 04/04/21                                 Surgeon:  DR. Gerrit Heck Surgeon's Group or Practice Name:  [facility] Phone number:  [phone] Fax number:  [phone]   Type of Clearance Requested:   - Medical  - Pharmacy:  Hold Clopidogrel (Plavix) PER CLEARANCE REQUEST FORM HOLD TIME FOR PLAVIX 5 DAYS PRIOR   Type of Anesthesia:   PROPOFOL   Additional requests/questions:    [name]   03/26/2021, 3:42 PM

## 2021-04-02 NOTE — Telephone Encounter (Signed)
I am routing this clearance back to pre op to be addressed. It was sent to pre op pool on 03/26/21. Our office received another clearance request today. In my review I do not see that the clearance was addressed. Pt's procedure is on 04/04/21.

## 2021-04-02 NOTE — Telephone Encounter (Signed)
In further review of the pt's chart, the pt was seen and cleared by Nicholes Rough, PAC on 03/12/21. Clearance notes were faxed over to requesting office at that time. I will re-fax clearance and ov note giving clearance to requesting office.

## 2021-04-03 ENCOUNTER — Encounter: Payer: Self-pay | Admitting: Internal Medicine

## 2021-04-04 ENCOUNTER — Ambulatory Visit: Payer: Medicaid Other | Admitting: Certified Registered Nurse Anesthetist

## 2021-04-04 ENCOUNTER — Encounter
Admission: RE | Disposition: A | Discharge status: Home / Self Care | Payer: Self-pay | Source: Home / Self Care | Attending: Internal Medicine

## 2021-04-04 ENCOUNTER — Ambulatory Visit
Admission: RE | Admit: 2021-04-04 | Discharge: 2021-04-04 | Disposition: A | Discharge status: Home / Self Care | Payer: Medicaid Other | Attending: Internal Medicine | Admitting: Internal Medicine

## 2021-04-04 DIAGNOSIS — J449 Chronic obstructive pulmonary disease, unspecified: Secondary | ICD-10-CM | POA: Diagnosis not present

## 2021-04-04 DIAGNOSIS — F17200 Nicotine dependence, unspecified, uncomplicated: Secondary | ICD-10-CM | POA: Diagnosis not present

## 2021-04-04 DIAGNOSIS — R1013 Epigastric pain: Secondary | ICD-10-CM | POA: Diagnosis present

## 2021-04-04 DIAGNOSIS — Z85828 Personal history of other malignant neoplasm of skin: Secondary | ICD-10-CM | POA: Diagnosis not present

## 2021-04-04 DIAGNOSIS — G8929 Other chronic pain: Secondary | ICD-10-CM | POA: Diagnosis not present

## 2021-04-04 DIAGNOSIS — I2510 Atherosclerotic heart disease of native coronary artery without angina pectoris: Secondary | ICD-10-CM | POA: Insufficient documentation

## 2021-04-04 DIAGNOSIS — D120 Benign neoplasm of cecum: Secondary | ICD-10-CM | POA: Diagnosis not present

## 2021-04-04 DIAGNOSIS — K3189 Other diseases of stomach and duodenum: Secondary | ICD-10-CM | POA: Insufficient documentation

## 2021-04-04 DIAGNOSIS — Z951 Presence of aortocoronary bypass graft: Secondary | ICD-10-CM | POA: Insufficient documentation

## 2021-04-04 DIAGNOSIS — Z79899 Other long term (current) drug therapy: Secondary | ICD-10-CM | POA: Diagnosis not present

## 2021-04-04 DIAGNOSIS — F419 Anxiety disorder, unspecified: Secondary | ICD-10-CM | POA: Diagnosis not present

## 2021-04-04 DIAGNOSIS — I10 Essential (primary) hypertension: Secondary | ICD-10-CM | POA: Insufficient documentation

## 2021-04-04 DIAGNOSIS — K219 Gastro-esophageal reflux disease without esophagitis: Secondary | ICD-10-CM | POA: Diagnosis not present

## 2021-04-04 DIAGNOSIS — K921 Melena: Secondary | ICD-10-CM | POA: Diagnosis not present

## 2021-04-04 DIAGNOSIS — Z955 Presence of coronary angioplasty implant and graft: Secondary | ICD-10-CM | POA: Insufficient documentation

## 2021-04-04 DIAGNOSIS — E785 Hyperlipidemia, unspecified: Secondary | ICD-10-CM | POA: Diagnosis not present

## 2021-04-04 DIAGNOSIS — I252 Old myocardial infarction: Secondary | ICD-10-CM | POA: Insufficient documentation

## 2021-04-04 DIAGNOSIS — K2289 Other specified disease of esophagus: Secondary | ICD-10-CM | POA: Diagnosis not present

## 2021-04-04 DIAGNOSIS — R110 Nausea: Secondary | ICD-10-CM | POA: Insufficient documentation

## 2021-04-04 DIAGNOSIS — K766 Portal hypertension: Secondary | ICD-10-CM | POA: Insufficient documentation

## 2021-04-04 DIAGNOSIS — K591 Functional diarrhea: Secondary | ICD-10-CM | POA: Insufficient documentation

## 2021-04-04 HISTORY — PX: ESOPHAGOGASTRODUODENOSCOPY: SHX5428

## 2021-04-04 HISTORY — PX: COLONOSCOPY WITH PROPOFOL: SHX5780

## 2021-04-04 SURGERY — COLONOSCOPY WITH PROPOFOL
Anesthesia: General

## 2021-04-04 MED ORDER — LIDOCAINE HCL (CARDIAC) PF 100 MG/5ML IV SOSY
PREFILLED_SYRINGE | INTRAVENOUS | Status: DC | PRN
Start: 2021-04-04 — End: 2021-04-04
  Administered 2021-04-04: 100 mg via INTRAVENOUS

## 2021-04-04 MED ORDER — PROPOFOL 500 MG/50ML IV EMUL
INTRAVENOUS | Status: AC
  Filled 2021-04-04: qty 100

## 2021-04-04 MED ORDER — PROPOFOL 500 MG/50ML IV EMUL
INTRAVENOUS | Status: DC | PRN
  Administered 2021-04-04: 160 ug/kg/min via INTRAVENOUS

## 2021-04-04 MED ORDER — PROPOFOL 500 MG/50ML IV EMUL
INTRAVENOUS | Status: AC
  Filled 2021-04-04: qty 50

## 2021-04-04 MED ORDER — PHENYLEPHRINE HCL (PRESSORS) 10 MG/ML IV SOLN
INTRAVENOUS | Status: DC | PRN
Start: 2021-04-04 — End: 2021-04-04
  Administered 2021-04-04 (×3): 80 ug via INTRAVENOUS
  Administered 2021-04-04: 40 ug via INTRAVENOUS

## 2021-04-04 MED ORDER — SODIUM CHLORIDE 0.9 % IV SOLN
INTRAVENOUS | Status: DC
  Administered 2021-04-04: 20 mL/h via INTRAVENOUS

## 2021-04-04 MED ORDER — GLYCOPYRROLATE 0.2 MG/ML IJ SOLN
INTRAMUSCULAR | Status: DC | PRN
  Administered 2021-04-04: .2 mg via INTRAVENOUS

## 2021-04-04 MED ORDER — PROPOFOL 10 MG/ML IV BOLUS
INTRAVENOUS | Status: DC | PRN
  Administered 2021-04-04: 20 mg via INTRAVENOUS
  Administered 2021-04-04: 30 mg via INTRAVENOUS
  Administered 2021-04-04: 20 mg via INTRAVENOUS
  Administered 2021-04-04: 50 mg via INTRAVENOUS

## 2021-04-04 MED ORDER — PHENYLEPHRINE 40 MCG/ML (10ML) SYRINGE FOR IV PUSH (FOR BLOOD PRESSURE SUPPORT)
PREFILLED_SYRINGE | INTRAVENOUS | Status: AC
  Filled 2021-04-04: qty 10

## 2021-04-04 NOTE — Anesthesia Procedure Notes (Signed)
Date/Time: 04/04/2021 9:57 AM ?Performed by: Demetrius Charity, CRNA ?Pre-anesthesia Checklist: Patient identified, Emergency Drugs available, Suction available, Patient being monitored and Timeout performed ?Patient Re-evaluated:Patient Re-evaluated prior to induction ?Oxygen Delivery Method: Nasal cannula ?Induction Type: IV induction ?Placement Confirmation: CO2 detector and positive ETCO2 ? ? ? ? ?

## 2021-04-04 NOTE — Anesthesia Postprocedure Evaluation (Signed)
Anesthesia Post Note ? ?Patient: Greg [name] ? ?Procedure(s) Performed: COLONOSCOPY WITH PROPOFOL ?ESOPHAGOGASTRODUODENOSCOPY (EGD) ? ?Patient location during evaluation: PACU ?Anesthesia Type: General ?Level of consciousness: awake and awake and alert ?Pain management: pain level controlled ?Vital Signs Assessment: post-procedure vital signs reviewed and stable ?Respiratory status: spontaneous breathing and nonlabored ventilation ?Cardiovascular status: blood pressure returned to baseline and stable ?Anesthetic complications: no ? ? ?No notable events documented. ? ? ?Last Vitals:  ?Vitals:  ? 04/04/21 1028 04/04/21 1048  ?BP: 105/64 138/85  ?Pulse: 91   ?Resp:    ?Temp: (!) 36.2 ?C   ?SpO2: 99%   ?  ?Last Pain:  ?Vitals:  ? 04/04/21 1048  ?TempSrc:   ?PainSc: 0-No pain  ? ? ?  ?  ?  ?  ?  ?  ? ?VAN STAVEREN,[name] ? ? ? ? ?

## 2021-04-04 NOTE — Op Note (Signed)
[facility] ?Gastroenterology ?Patient Name: Greg [name] ?Procedure Date: 04/04/2021 9:44 AM ?MRN: [id number] ?Account #: [id number] ?Date of Birth: [birth year] ?Admit Type: Outpatient ?Age: 51 ?Room: [facility] ENDO ROOM 2 ?Gender: Male ?Note Status: Finalized ?Instrument Name: Colonscope [id number] ?Procedure:             Colonoscopy ?Indications:           Functional diarrhea, Hematochezia ?Providers:             Benay Pike. Sheilah Rayos MD, MD ?Medicines:             Propofol per Anesthesia ?Complications:         No immediate complications. Estimated blood loss:  ?                       Minimal. ?Procedure:             Pre-Anesthesia Assessment: ?                       - The risks and benefits of the procedure and the  ?                       sedation options and risks were discussed with the  ?                       patient. All questions were answered and informed  ?                       consent was obtained. ?                       - Patient identification and proposed procedure were  ?                       verified prior to the procedure by the nurse. The  ?                       procedure was verified in the procedure room. ?                       - ASA Grade Assessment: III - A patient with severe  ?                       systemic disease. ?                       - After reviewing the risks and benefits, the patient  ?                       was deemed in satisfactory condition to undergo the  ?                       procedure. ?                       After obtaining informed consent, the colonoscope was  ?                       passed under direct vision. Throughout the procedure,  ?  the patient's blood pressure, pulse, and oxygen  ?                       saturations were monitored continuously. The  ?                       Colonoscope was introduced through the anus and  ?                       advanced to the the terminal ileum, with  ?                       identification of  the appendiceal orifice and IC  ?                       valve. The colonoscopy was performed without  ?                       difficulty. The patient tolerated the procedure well.  ?                       The quality of the bowel preparation was adequate. The  ?                       terminal ileum, ileocecal valve, appendiceal orifice,  ?                       and rectum were photographed. ?Findings: ?     The perianal and digital rectal examinations were normal. Pertinent  ?     negatives include normal sphincter tone and no palpable rectal lesions. ?     A segmental area of moderately friable mucosa with contact bleeding was  ?     found in the distal rectum. This was biopsied with a cold forceps for  ?     histology. ?     The terminal ileum appeared normal. Biopsies were taken with a cold  ?     forceps for histology. ?     A 7 mm polyp was found in the cecum. The polyp was sessile. The polyp  ?     was removed with a jumbo cold forceps. Resection and retrieval were  ?     complete. ?     No other significant abnormalities were identified in a careful  ?     examination of the remainder of the colon. ?     Biopsies for histology were taken with a cold forceps from the right  ?     colon and left colon for evaluation of microscopic colitis. ?Impression:            - Friability with contact bleeding in the distal  ?                       rectum. Biopsied. ?                       - The examined portion of the ileum was normal.  ?                       Biopsied. ?                       -  One 7 mm polyp in the cecum, removed with a jumbo  ?                       cold forceps. Resected and retrieved. ?                       - Biopsies were taken with a cold forceps from the  ?                       right colon and left colon for evaluation of  ?                       microscopic colitis. ?Recommendation:        - Await pathology results from EGD, also performed  ?                       today. ?                        - Patient has a contact number available for  ?                       emergencies. The signs and symptoms of potential  ?                       delayed complications were discussed with the patient.  ?                       Return to normal activities tomorrow. Written  ?                       discharge instructions were provided to the patient. ?                       - Resume previous diet. ?                       - Continue present medications. ?                       - Await pathology results. ?                       - Repeat colonoscopy is recommended for surveillance.  ?                       The colonoscopy date will be determined after  ?                       pathology results from today's exam become available  ?                       for review. ?                       - Return to physician assistant in 2 months. ?                       - Follow up with Octavia Bruckner, PA-C in the GI office.  ?                       [phone] ?                       - The findings and recommendations were discussed with  ?                       the patient. ?Procedure Code(s):     --- Professional --- ?                       [phone], Colonoscopy, flexible; with biopsy, single or  ?                       multiple ?Diagnosis Code(s):     --- Professional --- ?                       K92.1, Melena (includes Hematochezia) ?                       K59.1, Functional diarrhea ?                       K63.5, Polyp of colon ?                       K62.5, Hemorrhage of anus and rectum ?CPT copyright 2019 American Medical Association. All rights reserved. ?The codes documented in this report are preliminary and upon coder review may  ?be revised to meet current compliance requirements. ?Efrain Sella MD, MD ?04/04/2021 10:30:58 AM ?This report has been signed electronically. ?Number of Addenda: 0 ?Note Initiated On: 04/04/2021 9:44 AM ?Scope Withdrawal Time: 0 hours 7 minutes 24 seconds  ?Total Procedure Duration: 0 hours 11 minutes 41  seconds  ?Estimated Blood Loss:  Estimated blood loss was minimal. ?     [facility] ?

## 2021-04-04 NOTE — Anesthesia Preprocedure Evaluation (Signed)
Anesthesia Evaluation  ?Patient identified by MRN, date of birth, ID band ?Patient awake ? ? ? ?Reviewed: ?Allergy & Precautions, NPO status , Patient's Chart, lab work & pertinent test results ? ?Airway ?Mallampati: II ? ?TM Distance: >3 FB ?Neck ROM: full ? ? ? Dental ? ?(+) Upper Dentures, Lower Dentures ?  ?Pulmonary ?neg pulmonary ROS, asthma , COPD, Current Smoker and Patient abstained from smoking.,  ?  ?Pulmonary exam normal ? ?+ decreased breath sounds ? ? ? ? ? Cardiovascular ?Exercise Tolerance: Poor ?hypertension, Pt. on medications ?+ CAD, + Past MI and + CABG  ?negative cardio ROS ?Normal cardiovascular exam ?Rhythm:Regular  ? ?  ?Neuro/Psych ? Headaches, Anxiety negative neurological ROS ? negative psych ROS  ? GI/Hepatic ?negative GI ROS, Neg liver ROS, GERD  ,  ?Endo/Other  ?negative endocrine ROS ? Renal/GU ?negative Renal ROS  ?negative genitourinary ?  ?Musculoskeletal ? ? Abdominal ?Normal abdominal exam  (+)   ?Peds ?negative pediatric ROS ?(+)  Hematology ?negative hematology ROS ?(+)   ?Anesthesia Other Findings ?Past Medical History: ?No date: Anxiety ?No date: Asthma ?No date: CAD (coronary artery disease) of bypass graft ?    Comment:  a. 11/2017 CABG x 3 (LIMA to LAD, SVG->dRCA, SVG->LCX);  ?             b. 07/2019 Cath/PCI: LM nl, LAD 50ost, 90p, 45m LCX  ?             90p/m, RCA 30p, 459m80d (2.0x15 Resolute DES),  ?             LIMA->mLAD ok, VG->OM3 40, VG->RPDA 100, EF 60%. ?No date: COPD (chronic obstructive pulmonary disease) ([facility]?11/19/2017: COPD, severe ([facility]?    Comment:  fev1 33% predicted  ?No date: Daily headache ?No date: ETOH abuse ?No date: GERD (gastroesophageal reflux disease) ?No date: Hyperlipidemia LDL goal <70 ?No date: Hypertension ?[facility]: [facility]?No date: Skin cancer ?    Comment:  "cut off my eyelid and my neck" (11/12/2017) BCC ?No date: Tobacco abuse ? ?Past Surgical History: ?11/21/2017: CORONARY  ARTERY BYPASS GRAFT; N/A ?    Comment:  Procedure: CORONARY ARTERY BYPASS GRAFTING (CABG) times  ?             3 using left internal mammary artery and right greater  ?             saphenous vein harvested endscopically. LIMA to LAD, SVG  ?             to Distal Right, SVG to Circ.;  Surgeon: GeLanelle Bal             B, MD;  [facility] Service: Open [facility] Surgery;   ?             Laterality: N/A; ?07/16/2019: CORONARY STENT INTERVENTION; N/A ?    Comment:  Procedure: CORONARY STENT INTERVENTION;  Surgeon: KeClaiborne Billings?             ThJoyice FasterMD;  [facility];  Service:  ?             Cardiovascular;  Laterality: N/A; ?No date: LACERATION REPAIR ?    Comment:  "had staples put in my head" (11/12/2017) ?11/13/2017: LEFT HEART CATH AND CORONARY ANGIOGRAPHY; N/A ?    Comment:  Procedure: LEFT HEART CATH AND CORONARY ANGIOGRAPHY;   ?  Surgeon: Troy Sine, MD;  Location: [facility] INVASIVE CV  ?             LAB;  Service: Cardiovascular;  Laterality: N/A; ?07/16/2019: LEFT HEART CATH AND CORS/GRAFTS ANGIOGRAPHY; N/A ?    Comment:  Procedure: LEFT HEART CATH AND CORS/GRAFTS ANGIOGRAPHY;  ?             Surgeon: Troy Sine, MD;  Location: [facility] INVASIVE CV  ?             LAB;  Service: Cardiovascular;  Laterality: N/A; ?No date: SKIN CANCER EXCISION ?    Comment:  "cut off my eyelid and my neck" (11/12/2017) ?11/21/2017: TEE WITHOUT CARDIOVERSION; N/A ?    Comment:  Procedure: TRANSESOPHAGEAL ECHOCARDIOGRAM (TEE);   ?             Surgeon: Grace Isaac, MD;  [facility] OR;   ?             Service: Open Heart Surgery;  Laterality: N/A; ? ?BMI   ? Body Mass Index: 20.51 kg/m?  ?  ? ? Reproductive/Obstetrics ?negative OB ROS ? ?  ? ? ? ? ? ? ? ? ? ? ? ? ? ?  ?  ? ? ? ? ? ? ? ? ?Anesthesia Physical ?Anesthesia Plan ? ?ASA: 3 ? ?Anesthesia Plan: General  ? ?Post-op Pain Management:   ? ?Induction: Intravenous ? ?PONV Risk Score and Plan: Propofol infusion and TIVA ? ?Airway Management  Planned: Natural Airway and Nasal Cannula ? ?Additional Equipment:  ? ?Intra-op Plan:  ? ?Post-operative Plan:  ? ?Informed Consent: I have reviewed the patients History and Physical, chart, labs and discussed the procedure including the risks, benefits and alternatives for the proposed anesthesia with the patient or authorized representative who has indicated his/her understanding and acceptance.  ? ? ? ?Dental Advisory Given ? ?Plan Discussed with: CRNA and Surgeon ? ?Anesthesia Plan Comments:   ? ? ? ? ? ? ?Anesthesia Quick Evaluation ? ?

## 2021-04-04 NOTE — H&P (Signed)
Outpatient short stay form Pre-procedure ?04/04/2021 9:47 AM ?Greg [name] K. Alice Reichert, M.D. ? ?Primary Physician: Ashely B. Crutchfield, FNP ? ?Reason for visit:  Epigastric pain, rectal bleeding, diarrhea, nausea ? ?History of present illness:  Abdominal pain, chronic, epigastric   ?Abdominal pain, generalized   ?Chronic diarrhea, unspecified   ?Gastritis without bleeding, unspecified chronicity, unspecified gastritis type   ?Peptic duodenitis   ?Nausea without vomiting   ?Rectal bleeding   ? ? ? ?Current Facility-Administered Medications:  ?  0.9 %  sodium chloride infusion, , Intravenous, Continuous, [place], Benay Pike, MD, Last Rate: 20 mL/hr at 04/04/21 6759, Continued from Pre-op at 04/04/21 0907 ? ?Medications Prior to Admission  ?Medication Sig Dispense Refill Last Dose  ? acetaminophen (TYLENOL) 500 MG tablet Take 1,000 mg by mouth every 6 (six) hours as needed for moderate pain or headache.   04/03/2021  ? albuterol (VENTOLIN HFA) 108 (90 Base) MCG/ACT inhaler Inhale 2 puffs into the lungs every 6 (six) hours as needed for wheezing or shortness of breath. 1 Inhaler 0 04/03/2021  ? ALPRAZolam (XANAX) 1 MG tablet Take 1 mg by mouth 3 (three) times daily.    04/04/2021 at 0630  ? ANORO ELLIPTA 62.5-25 MCG/ACT AEPB INHALE 1 PUFF INTO LUNGS EVERY DAY 60 each 0 04/03/2021  ? aspirin 81 MG tablet Take 1 tablet (81 mg total) by mouth daily. 30 tablet 6 04/03/2021  ? atorvastatin (LIPITOR) 80 MG tablet Take 1 tablet (80 mg total) by mouth daily at 6 PM. 30 tablet 1 04/03/2021  ? clopidogrel (PLAVIX) 75 MG tablet TAKE 1 TABLET(75 MG) BY MOUTH DAILY 90 tablet 0 Past Week  ? metoprolol tartrate (LOPRESSOR) 25 MG tablet Take 1.5 tablets (37.5 mg total) by mouth 2 (two) times daily. 90 tablet 3 Past Week  ? Multiple Vitamin (MULTIVITAMIN WITH MINERALS) TABS tablet Take 1 tablet by mouth daily.   Past Week  ? nitroGLYCERIN (NITROSTAT) 0.6 MG SL tablet Place 0.6 mg under the tongue every 5 (five) minutes as needed for chest pain.   Past  Week  ? pantoprazole (PROTONIX) 40 MG tablet Take 1 tablet by mouth once daily 30 tablet 0 Past Week  ? potassium chloride SA (KLOR-CON M) 20 MEQ tablet Take 1 tablet (20 mEq total) by mouth daily. Please take with your lasix 3 tablet 0 Past Week  ? sucralfate (CARAFATE) 1 g tablet Take 1 g by mouth 4 (four) times daily.   Past Week  ? furosemide (LASIX) 40 MG tablet Take 1 tablet (40 mg total) by mouth daily for 3 days. 3 tablet 0   ? ? ? ?No Known Allergies ? ? ?Past Medical History:  ?Diagnosis Date  ? Anxiety   ? Asthma   ? CAD (coronary artery disease) of bypass graft   ? a. 11/2017 CABG x 3 (LIMA to LAD, SVG->dRCA, SVG->LCX); b. 07/2019 Cath/PCI: LM nl, LAD 50ost, 90p, 74m LCX 90p/m, RCA 30p, 435m80d (2.0x15 Resolute DES), LIMA->mLAD ok, VG->OM3 40, VG->RPDA 100, EF 60%.  ? COPD (chronic obstructive pulmonary disease) ([place]  ? [place]  ? fev1 33% predicted   ? Daily headache   ? ETOH abuse   ? GERD (gastroesophageal reflux disease)   ? Hyperlipidemia LDL goal <70   ? Hypertension   ? [facility]  ? Skin cancer   ? "cut off my eyelid and my neck" (11/12/2017) BCC  ? Tobacco abuse   ? ? ?Review of systems:  Otherwise negative.  ? ? ?  Physical Exam ? ?Gen: Alert, oriented. Appears stated age.  ?HEENT: [place]/AT. PERRLA. ?Lungs: CTA, no wheezes. ?CV: RR nl S1, S2. ?Abd: soft, benign, no masses. BS+ ?Ext: No edema. Pulses 2+ ? ? ? ?Planned procedures: Proceed with EGD and ccolonoscopy. The patient understands the nature of the planned procedure, indications, risks, alternatives and potential complications including but not limited to bleeding, infection, perforation, damage to internal organs and possible oversedation/side effects from anesthesia. The patient agrees and gives consent to proceed.  ?Please refer to procedure notes for findings, recommendations and patient disposition/instructions.  ? ? ? ?Lynnann Knudsen K. Alice Reichert, M.D. ?Gastroenterology ?04/04/2021  9:47 AM ? ? ? ? ? ? ?

## 2021-04-04 NOTE — Op Note (Signed)
[facility] ?Gastroenterology ?Patient Name: Greg [name] ?Procedure Date: 04/04/2021 9:44 AM ?MRN: [id number] ?Account #: [id number] ?Date of Birth: Dec 01, 1970 ?Admit Type: Outpatient ?Age: 51 ?Room: [facility] ENDO ROOM 2 ?Gender: Male ?Note Status: Finalized ?Instrument Name: Upper Endoscope [id number] ?Procedure:             Upper GI endoscopy ?Indications:           Epigastric abdominal pain, Nausea ?Providers:             Benay Pike. Geraldene Eisel MD, MD ?Medicines:             Propofol per Anesthesia ?Complications:         No immediate complications. ?Procedure:             Pre-Anesthesia Assessment: ?                       - The risks and benefits of the procedure and the  ?                       sedation options and risks were discussed with the  ?                       patient. All questions were answered and informed  ?                       consent was obtained. ?                       - Patient identification and proposed procedure were  ?                       verified prior to the procedure by the nurse. The  ?                       procedure was verified in the procedure room. ?                       - ASA Grade Assessment: III - A patient with severe  ?                       systemic disease. ?                       - After reviewing the risks and benefits, the patient  ?                       was deemed in satisfactory condition to undergo the  ?                       procedure. ?                       After obtaining informed consent, the endoscope was  ?                       passed under direct vision. Throughout the procedure,  ?                       the patient's blood pressure, pulse, and oxygen  ?  saturations were monitored continuously. The Endoscope  ?                       was introduced through the mouth, and advanced to the  ?                       third part of duodenum. The upper GI endoscopy was  ?                       accomplished without difficulty. The  patient tolerated  ?                       the procedure well. ?Findings: ?     The Z-line was irregular and was found at the gastroesophageal junction. ?     Moderate portal hypertensive gastropathy was found in the entire  ?     examined stomach. Biopsies were taken with a cold forceps for histology. ?     There is no endoscopic evidence of hiatal hernia, ulceration or varices  ?     in the entire examined stomach. ?     The examined duodenum was normal. ?     The exam was otherwise without abnormality. ?Impression:            - Z-line irregular, at the gastroesophageal junction. ?                       - Portal hypertensive gastropathy. Biopsied. ?                       - Normal examined duodenum. ?                       - The examination was otherwise normal. ?Recommendation:        - Await pathology results. ?                       - Proceed with colonoscopy ?Procedure Code(s):     --- Professional --- ?                       [phone], Esophagogastroduodenoscopy, flexible,  ?                       transoral; with biopsy, single or multiple ?Diagnosis Code(s):     --- Professional --- ?                       R11.0, Nausea ?                       R10.13, Epigastric pain ?                       K31.89, Other diseases of stomach and duodenum ?                       K76.6, Portal hypertension ?                       K22.8, Other specified diseases of esophagus ?CPT copyright 2019 American Medical Association. All rights reserved. ?The codes documented in this report are preliminary and upon coder review may  ?  be revised to meet current compliance requirements. ?Efrain Sella MD, MD ?04/04/2021 10:09:43 AM ?This report has been signed electronically. ?Number of Addenda: 0 ?Note Initiated On: 04/04/2021 9:44 AM ?Estimated Blood Loss:  Estimated blood loss: none. ?     [facility] ?

## 2021-04-04 NOTE — Interval H&P Note (Signed)
History and Physical Interval Note: ? ?04/04/2021 ?9:57 AM ? ?Greg [name]  has presented today for surgery, with the diagnosis of ABDOMINAL PAIN ?NAUSEA ?RECTAL BLEED.  The various methods of treatment have been discussed with the patient and family. After consideration of risks, benefits and other options for treatment, the patient has consented to  Procedure(s): ?COLONOSCOPY WITH PROPOFOL (N/A) ?ESOPHAGOGASTRODUODENOSCOPY (EGD) (N/A) as a surgical intervention.  The patient's history has been reviewed, patient examined, no change in status, stable for surgery.  I have reviewed the patient's chart and labs.  Questions were answered to the patient's satisfaction.   ? ? ?[place], [place] ? ? ?

## 2021-04-04 NOTE — Transfer of Care (Signed)
Immediate Anesthesia Transfer of Care Note ? ?Patient: Greg [name] ? ?Procedure(s) Performed: COLONOSCOPY WITH PROPOFOL ?ESOPHAGOGASTRODUODENOSCOPY (EGD) ? ?Patient Location: PACU ? ?Anesthesia Type:General ? ?Level of Consciousness: awake, alert  and oriented ? ?Airway & Oxygen Therapy: Patient Spontanous Breathing ? ?Post-op Assessment: Report given to RN and Post -op Vital signs reviewed and stable ? ?Post vital signs: Reviewed and stable ? ?Last Vitals:  ?Vitals Value Taken Time  ?BP    ?Temp    ?Pulse    ?Resp    ?SpO2    ? ? ?Last Pain:  ?Vitals:  ? 04/04/21 0826  ?TempSrc: Temporal  ?PainSc: 5   ?   ? ?  ? ?Complications: No notable events documented. ?

## 2021-04-05 ENCOUNTER — Encounter: Payer: Self-pay | Admitting: Internal Medicine

## 2021-04-06 LAB — SURGICAL PATHOLOGY

## 2021-04-24 ENCOUNTER — Other Ambulatory Visit: Payer: Self-pay | Admitting: Internal Medicine

## 2021-06-26 ENCOUNTER — Ambulatory Visit: Payer: Medicaid Other | Admitting: Internal Medicine

## 2021-07-09 ENCOUNTER — Other Ambulatory Visit: Payer: Self-pay | Admitting: *Deleted

## 2021-07-09 DIAGNOSIS — Z122 Encounter for screening for malignant neoplasm of respiratory organs: Secondary | ICD-10-CM

## 2021-07-09 DIAGNOSIS — Z87891 Personal history of nicotine dependence: Secondary | ICD-10-CM

## 2021-07-09 DIAGNOSIS — F17210 Nicotine dependence, cigarettes, uncomplicated: Secondary | ICD-10-CM

## 2021-08-01 ENCOUNTER — Ambulatory Visit: Payer: Medicaid Other | Admitting: Internal Medicine

## 2021-08-02 ENCOUNTER — Other Ambulatory Visit: Payer: Self-pay | Admitting: Family Medicine

## 2021-08-02 ENCOUNTER — Other Ambulatory Visit (HOSPITAL_COMMUNITY): Payer: Self-pay | Admitting: Family Medicine

## 2021-08-02 DIAGNOSIS — Z87891 Personal history of nicotine dependence: Secondary | ICD-10-CM

## 2021-08-14 ENCOUNTER — Encounter: Payer: Medicaid Other | Admitting: Acute Care

## 2021-08-15 ENCOUNTER — Ambulatory Visit: Payer: Medicaid Other

## 2021-08-15 ENCOUNTER — Other Ambulatory Visit: Payer: Medicaid Other

## 2021-08-20 ENCOUNTER — Ambulatory Visit: Admission: RE | Admit: 2021-08-20 | Payer: Medicaid Other | Source: Ambulatory Visit

## 2021-08-23 ENCOUNTER — Emergency Department (HOSPITAL_BASED_OUTPATIENT_CLINIC_OR_DEPARTMENT_OTHER)
Admission: EM | Admit: 2021-08-23 | Discharge: 2021-08-23 | Discharge status: Against Medical Advice | Payer: Medicaid Other | Attending: Emergency Medicine | Admitting: Emergency Medicine

## 2021-08-23 ENCOUNTER — Encounter (HOSPITAL_BASED_OUTPATIENT_CLINIC_OR_DEPARTMENT_OTHER): Payer: Self-pay

## 2021-08-23 DIAGNOSIS — Z7902 Long term (current) use of antithrombotics/antiplatelets: Secondary | ICD-10-CM | POA: Insufficient documentation

## 2021-08-23 DIAGNOSIS — Z951 Presence of aortocoronary bypass graft: Secondary | ICD-10-CM | POA: Insufficient documentation

## 2021-08-23 DIAGNOSIS — I2510 Atherosclerotic heart disease of native coronary artery without angina pectoris: Secondary | ICD-10-CM | POA: Diagnosis not present

## 2021-08-23 DIAGNOSIS — Z7982 Long term (current) use of aspirin: Secondary | ICD-10-CM | POA: Diagnosis not present

## 2021-08-23 DIAGNOSIS — K625 Hemorrhage of anus and rectum: Secondary | ICD-10-CM | POA: Insufficient documentation

## 2021-08-23 LAB — URINALYSIS, ROUTINE W REFLEX MICROSCOPIC
Bilirubin Urine: NEGATIVE
Glucose, UA: NEGATIVE mg/dL
Hgb urine dipstick: NEGATIVE
Ketones, ur: NEGATIVE mg/dL
Nitrite: NEGATIVE
Specific Gravity, Urine: 1.018 (ref 1.005–1.030)
pH: 5.5 (ref 5.0–8.0)

## 2021-08-23 LAB — COMPREHENSIVE METABOLIC PANEL
ALT: 46 U/L — ABNORMAL HIGH (ref 0–44)
AST: 118 U/L — ABNORMAL HIGH (ref 15–41)
Albumin: 3.2 g/dL — ABNORMAL LOW (ref 3.5–5.0)
Alkaline Phosphatase: 161 U/L — ABNORMAL HIGH (ref 38–126)
Anion gap: 13 (ref 5–15)
BUN: 5 mg/dL — ABNORMAL LOW (ref 6–20)
CO2: 26 mmol/L (ref 22–32)
Calcium: 8.9 mg/dL (ref 8.9–10.3)
Chloride: 101 mmol/L (ref 98–111)
Creatinine, Ser: 0.63 mg/dL (ref 0.61–1.24)
GFR, Estimated: >60 mL/min (ref >60)
Glucose, Bld: 122 mg/dL — ABNORMAL HIGH (ref 70–99)
Potassium: 3.7 mmol/L (ref 3.5–5.1)
Sodium: 140 mmol/L (ref 135–145)
Total Bilirubin: 1.1 mg/dL (ref 0.3–1.2)
Total Protein: 7.1 g/dL (ref 6.5–8.1)

## 2021-08-23 LAB — CBC WITH DIFFERENTIAL/PLATELET
Abs Immature Granulocytes: 0.03 10*3/uL (ref 0.00–0.07)
Basophils Absolute: 0.1 10*3/uL (ref 0.0–0.1)
Basophils Relative: 1 %
Eosinophils Absolute: 0.2 10*3/uL (ref 0.0–0.5)
Eosinophils Relative: 1 %
HCT: 37 % — ABNORMAL LOW (ref 39.0–52.0)
Hemoglobin: 12.3 g/dL — ABNORMAL LOW (ref 13.0–17.0)
Immature Granulocytes: 0 %
Lymphocytes Relative: 21 %
Lymphs Abs: 2.3 10*3/uL (ref 0.7–4.0)
MCH: 31.4 pg (ref 26.0–34.0)
MCHC: 33.2 g/dL (ref 30.0–36.0)
MCV: 94.4 fL (ref 80.0–100.0)
Monocytes Absolute: 0.9 10*3/uL (ref 0.1–1.0)
Monocytes Relative: 8 %
Neutro Abs: 7.6 10*3/uL (ref 1.7–7.7)
Neutrophils Relative %: 69 %
Platelets: 237 10*3/uL (ref 150–400)
RBC: 3.92 MIL/uL — ABNORMAL LOW (ref 4.22–5.81)
RDW: 22.6 % — ABNORMAL HIGH (ref 11.5–15.5)
WBC: 11.1 10*3/uL — ABNORMAL HIGH (ref 4.0–10.5)
nRBC: 0 % (ref 0.0–0.2)

## 2021-08-23 LAB — LIPASE, BLOOD: Lipase: 15 U/L (ref 11–51)

## 2021-08-23 NOTE — ED Notes (Signed)
Abdominal pain lower right on going for the last few days. Since pain started, he has been having bloody stools. Bright red in color. Pain doesn't change upon using the restroom. Also has been having nose bleeds for the last day. X6 times yesterday and x5 today, bloody stools. Feels light headed and dizzy upon ambulating. No obvious injury or to note from the Pt. Hx of GI probs and sees a GI MD but unable to get an appointment until later this year.

## 2021-08-23 NOTE — ED Provider Notes (Signed)
[place] EMERGENCY DEPT Provider Note   CSN: [id number] Arrival date & time: 08/23/21  1920     History  Chief Complaint  Patient presents with   gross blood in stool    Greg [name] is a 51 y.o. male.  Greg [name] is a 51 year old male with a medical history including coronary artery disease with history of CABG and stents on aspirin and Plavix as well as history of GI bleed who presents with 3 days of bright red blood per rectum and melena.  He notes intermittent dark and pale stools as well as intermittent right red blood in his toilet after having a bowel movement.  He notes these episodes are painful in his rectum.  He has intermittent left upper quadrant abdominal pain that is stable over several months.  He denies any nausea, vomiting, chest pain, shortness of breath, fatigue, increased lower extremity edema, cough, new abdominal pain.  He notes that he does not want to stay for long and does not want to be admitted to the hospital because he was worked up extensively in December 2022 and they did not find any reason for his bleeding.        Home Medications Prior to Admission medications   Medication Sig Start Date End Date Taking? Authorizing Provider  acetaminophen (TYLENOL) 500 MG tablet Take 1,000 mg by mouth every 6 (six) hours as needed for moderate pain or headache.    [provider]  albuterol (VENTOLIN HFA) 108 (90 Base) MCG/ACT inhaler Inhale 2 puffs into the lungs every 6 (six) hours as needed for wheezing or shortness of breath. 07/06/18   Greg Rockers, MD  ALPRAZolam Duanne Moron) 1 MG tablet Take 1 mg by mouth 3 (three) times daily.     [provider]  Greg [name] 62.5-25 MCG/ACT AEPB INHALE 1 PUFF INTO LUNGS EVERY DAY 12/11/20   Greg Rockers, MD  aspirin 81 MG tablet Take 1 tablet (81 mg total) by mouth daily. 07/17/19   Greg Gianotti, NP  atorvastatin (LIPITOR) 80 MG tablet Take 1 tablet (80 mg total) by  mouth daily at 6 PM. 11/26/17   Greg Collard, PA-C  clopidogrel (PLAVIX) 75 MG tablet TAKE 1 TABLET(75 MG) BY MOUTH DAILY 04/24/21   Greg Records, MD  furosemide (LASIX) 40 MG tablet Take 1 tablet (40 mg total) by mouth daily for 3 days. 03/12/21 03/15/21  Greg Collard, PA-C  metoprolol tartrate (LOPRESSOR) 25 MG tablet Take 1.5 tablets (37.5 mg total) by mouth 2 (two) times daily. 07/17/19   Greg Gianotti, NP  Multiple Vitamin (MULTIVITAMIN WITH MINERALS) TABS tablet Take 1 tablet by mouth daily.    [provider]  nitroGLYCERIN (NITROSTAT) 0.6 MG SL tablet Place 0.6 mg under the tongue every 5 (five) minutes as needed for chest pain. 01/16/21   [provider]  pantoprazole (PROTONIX) 40 MG tablet Take 1 tablet by mouth once daily 03/14/19   Greg Park, MD  potassium chloride SA (KLOR-CON M) 20 MEQ tablet Take 1 tablet (20 mEq total) by mouth daily. Please take with your lasix 03/12/21   Greg Collard, PA-C  sucralfate (CARAFATE) 1 g tablet Take 1 g by mouth 4 (four) times daily. 01/08/21   [provider]      Allergies    Patient has no known allergies.    Review of Systems   Review of Systems  Constitutional:  Negative for chills, fatigue and fever.  Eyes:  Negative for visual disturbance.  Respiratory:  Negative for cough, shortness of breath and wheezing.   Cardiovascular:  Negative for chest pain, palpitations and leg swelling.  Gastrointestinal:  Positive for abdominal pain and blood in stool. Negative for nausea and vomiting.  Genitourinary:  Negative for difficulty urinating and dysuria.  Musculoskeletal:  Negative for neck pain.  Neurological:  Positive for headaches. Negative for syncope and light-headedness.    Physical Exam Updated Vital Signs BP 120/81   Pulse 90   Temp 98.7 F (37.1 C) (Oral)   Resp 18   Ht 5' 11"  (1.803 m)   Wt 65.8 kg   SpO2 95%   BMI 20.22 kg/m  Physical Exam Vitals reviewed.  Constitutional:       General: He is not in acute distress. Cardiovascular:     Rate and Rhythm: Normal rate and regular rhythm.  Pulmonary:     Effort: Pulmonary effort is normal.     Breath sounds: Normal breath sounds.  Abdominal:     Palpations: Abdomen is soft.     Tenderness: There is no abdominal tenderness.  Musculoskeletal:     Right lower leg: No edema.     Left lower leg: No edema.  Skin:    Capillary Refill: Capillary refill takes less than 2 seconds.  Neurological:     Mental Status: He is alert.     ED Results / Procedures / Treatments   Labs (all labs ordered are listed, but only abnormal results are displayed) Labs Reviewed  COMPREHENSIVE METABOLIC PANEL - Abnormal; Notable for the following components:      Result Value   Glucose, Bld 122 (*)    BUN 5 (*)    Albumin 3.2 (*)    AST 118 (*)    ALT 46 (*)    Alkaline Phosphatase 161 (*)    All other components within normal limits  URINALYSIS, ROUTINE W REFLEX MICROSCOPIC - Abnormal; Notable for the following components:   Protein, ur TRACE (*)    Leukocytes,Ua MODERATE (*)    Bacteria, UA RARE (*)    All other components within normal limits  CBC WITH DIFFERENTIAL/PLATELET - Abnormal; Notable for the following components:   WBC 11.1 (*)    RBC 3.92 (*)    Hemoglobin 12.3 (*)    HCT 37.0 (*)    RDW 22.6 (*)    All other components within normal limits  LIPASE, BLOOD  HEMOGLOBIN AND HEMATOCRIT, BLOOD    EKG None  Radiology No results found.  Procedures Procedures    Medications Ordered in ED Medications - No data to display  ED Course/ Medical Decision Making/ A&P                           Medical Decision Making Greg [name] is a 51 year old male with a past medical history including coronary artery disease and previous GI bleed who presents with 3 days of melena and painful bright red blood per rectum.  Work-up showed an hemoglobin of 12.3 and no white cell count of 11.1.  Patient expressed his desire to  leave before finishing our work-up.  He was counseled extensively on the risks of not treating an active GI bleed especially while on aspirin and Plavix including the risk of death.  He initially agreed to stay for further evaluation but later decided to leave Greg [name].  Problems Addressed: Rectal bleeding: acute illness or injury  Amount and/or Complexity  of Data Reviewed Labs: ordered. Decision-making details documented in ED Course.          Final Clinical Impression(s) / ED Diagnoses Final diagnoses:  Rectal bleeding    Rx / DC Orders ED Discharge Orders     None         Johny Blamer, DO 08/24/21 0020    Veryl Speak, MD 08/24/21 [phone]

## 2021-08-23 NOTE — ED Provider Notes (Incomplete)
[place] EMERGENCY DEPT Provider Note   CSN: [id number] Arrival date & time: 08/23/21  1920     History {Add pertinent medical, surgical, social history, OB history to HPI:1} Chief Complaint  Patient presents with  . gross blood in stool    Greg [name] is a 51 y.o. male.  Greg [name] is a 51 year old male with a medical history including coronary artery disease with history of CABG and stents on aspirin and Plavix as well as history of GI bleed who presents with 3 days of bright red blood per rectum and melena.  He notes intermittent        Home Medications Prior to Admission medications   Medication Sig Start Date End Date Taking? Authorizing Provider  acetaminophen (TYLENOL) 500 MG tablet Take 1,000 mg by mouth every 6 (six) hours as needed for moderate pain or headache.    [provider]  albuterol (VENTOLIN HFA) 108 (90 Base) MCG/ACT inhaler Inhale 2 puffs into the lungs every 6 (six) hours as needed for wheezing or shortness of breath. 07/06/18   Tanda Rockers, MD  ALPRAZolam Duanne Moron) 1 MG tablet Take 1 mg by mouth 3 (three) times daily.     [provider]  Celedonio Miyamoto 62.5-25 MCG/ACT AEPB INHALE 1 PUFF INTO LUNGS EVERY DAY 12/11/20   Tanda Rockers, MD  aspirin 81 MG tablet Take 1 tablet (81 mg total) by mouth daily. 07/17/19   Theora Gianotti, NP  atorvastatin (LIPITOR) 80 MG tablet Take 1 tablet (80 mg total) by mouth daily at 6 PM. 11/26/17   Elgie Collard, PA-C  clopidogrel (PLAVIX) 75 MG tablet TAKE 1 TABLET(75 MG) BY MOUTH DAILY 04/24/21   Fay Records, MD  furosemide (LASIX) 40 MG tablet Take 1 tablet (40 mg total) by mouth daily for 3 days. 03/12/21 03/15/21  Elgie Collard, PA-C  metoprolol tartrate (LOPRESSOR) 25 MG tablet Take 1.5 tablets (37.5 mg total) by mouth 2 (two) times daily. 07/17/19   Theora Gianotti, NP  Multiple Vitamin (MULTIVITAMIN WITH MINERALS) TABS tablet Take 1 tablet by mouth daily.     [provider]  nitroGLYCERIN (NITROSTAT) 0.6 MG SL tablet Place 0.6 mg under the tongue every 5 (five) minutes as needed for chest pain. 01/16/21   [provider]  pantoprazole (PROTONIX) 40 MG tablet Take 1 tablet by mouth once daily 03/14/19   Thornton Park, MD  potassium chloride SA (KLOR-CON M) 20 MEQ tablet Take 1 tablet (20 mEq total) by mouth daily. Please take with your lasix 03/12/21   Elgie Collard, PA-C  sucralfate (CARAFATE) 1 g tablet Take 1 g by mouth 4 (four) times daily. 01/08/21   [provider]      Allergies    Patient has no known allergies.    Review of Systems   Review of Systems  Physical Exam Updated Vital Signs BP 120/81   Pulse 90   Temp 98.7 F (37.1 C) (Oral)   Resp 18   Ht 5' 11"  (1.803 m)   Wt 65.8 kg   SpO2 95%   BMI 20.22 kg/m  Physical Exam  ED Results / Procedures / Treatments   Labs (all labs ordered are listed, but only abnormal results are displayed) Labs Reviewed  COMPREHENSIVE METABOLIC PANEL - Abnormal; Notable for the following components:      Result Value   Glucose, Bld 122 (*)    BUN 5 (*)    Albumin 3.2 (*)  AST 118 (*)    ALT 46 (*)    Alkaline Phosphatase 161 (*)    All other components within normal limits  URINALYSIS, ROUTINE W REFLEX MICROSCOPIC - Abnormal; Notable for the following components:   Protein, ur TRACE (*)    Leukocytes,Ua MODERATE (*)    Bacteria, UA RARE (*)    All other components within normal limits  CBC WITH DIFFERENTIAL/PLATELET - Abnormal; Notable for the following components:   WBC 11.1 (*)    RBC 3.92 (*)    Hemoglobin 12.3 (*)    HCT 37.0 (*)    RDW 22.6 (*)    All other components within normal limits  LIPASE, BLOOD  HEMOGLOBIN AND HEMATOCRIT, BLOOD    EKG None  Radiology No results found.  Procedures Procedures  {Document cardiac monitor, telemetry assessment procedure when appropriate:1}  Medications Ordered in ED Medications - No data to  display  ED Course/ Medical Decision Making/ A&P                           Medical Decision Making Amount and/or Complexity of Data Reviewed Labs: ordered.   ***  {Document critical care time when appropriate:1} {Document review of labs and clinical decision tools ie heart score, Chads2Vasc2 etc:1}  {Document your independent review of radiology images, and any outside records:1} {Document your discussion with family members, caretakers, and with consultants:1} {Document social determinants of health affecting pt's care:1} {Document your decision making why or why not admission, treatments were needed:1} Final Clinical Impression(s) / ED Diagnoses Final diagnoses:  None    Rx / DC Orders ED Discharge Orders     None

## 2021-08-23 NOTE — ED Triage Notes (Signed)
Pt states gross bright red blood in stool x 2 days Pt does have hx of this Pt is on Plavix and ASA +nausea

## 2021-08-23 NOTE — ED Notes (Signed)
Blood work was attempted, Pt requested to not have the blood obtained and wanted to leave. Provider aware and went to talk to the Pt.

## 2021-08-30 ENCOUNTER — Ambulatory Visit: Payer: Medicaid Other | Admitting: Internal Medicine

## 2021-08-31 ENCOUNTER — Ambulatory Visit
Admission: RE | Admit: 2021-08-31 | Discharge: 2021-08-31 | Disposition: A | Discharge status: Home / Self Care | Payer: Medicaid Other | Source: Ambulatory Visit | Attending: Family Medicine | Admitting: Family Medicine

## 2021-08-31 DIAGNOSIS — Z87891 Personal history of nicotine dependence: Secondary | ICD-10-CM | POA: Diagnosis present

## 2021-10-17 ENCOUNTER — Telehealth: Payer: Self-pay | Admitting: Internal Medicine

## 2021-10-17 NOTE — Telephone Encounter (Signed)
Patient would like to switch providers, did not specify why or to which provider. Patient did schedule f/u with NP on 9/19.

## 2021-10-19 NOTE — Telephone Encounter (Signed)
Fine with me

## 2021-10-19 NOTE — Telephone Encounter (Signed)
Patient is wanting to switch providers.   Dr Melvyn Novas to Dr Verlee Monte  Dr Melvyn Novas are you ok with this change  Dr Verlee Monte are you ok with this change  Please advise

## 2021-10-22 NOTE — Telephone Encounter (Signed)
Ok with me 

## 2021-10-22 NOTE — Telephone Encounter (Signed)
Can we call and get paitent scheduled as a new pt with Dr Verlee Monte.  Thank you

## 2021-10-22 NOTE — Telephone Encounter (Signed)
Spk to wife appt scheduled with NM   Nothing further

## 2021-10-23 ENCOUNTER — Ambulatory Visit (INDEPENDENT_AMBULATORY_CARE_PROVIDER_SITE_OTHER): Payer: Medicaid Other | Admitting: Nurse Practitioner

## 2021-10-23 ENCOUNTER — Encounter: Payer: Self-pay | Admitting: Nurse Practitioner

## 2021-10-23 VITALS — BP 118/78 | HR 84 | Ht 70.5 in | Wt 140.2 lb

## 2021-10-23 DIAGNOSIS — R911 Solitary pulmonary nodule: Secondary | ICD-10-CM | POA: Diagnosis not present

## 2021-10-23 DIAGNOSIS — J449 Chronic obstructive pulmonary disease, unspecified: Secondary | ICD-10-CM

## 2021-10-23 DIAGNOSIS — Z23 Encounter for immunization: Secondary | ICD-10-CM

## 2021-10-23 NOTE — Patient Instructions (Addendum)
Continue Anoro 1 puff daily Continue Albuterol inhaler 2 puffs every 6 hours as needed for shortness of breath or wheezing. Notify if symptoms persist despite rescue inhaler/neb use.   I will talk to Dr. Verlee Monte about next steps regarding your lung nodule and contact you to set up either PET scan or bronchoscopy, like we discussed today. We will set up a follow up appointment once I hear back from him.

## 2021-10-23 NOTE — Progress Notes (Unsigned)
@Patient  ID: Greg [name], male    DOB: [birth year], 51 y.o.   MRN: [id number]  Chief Complaint  Patient presents with   Follow-up    Lung nodules Getting CT in Dec Flu vaccine today    Referring provider: No ref. provider found  HPI: 51 year old male, active smoker followed for COPD. Past medical history significant for NSTEMI, CAD, hypertension, GERD, EtOH abuse, HLD. He was previously seen by Dr. Melvyn Novas in 2021.    He was hospitalized from 01/25/2021 to 01/26/2021 for sepsis of unknown source and acute hypoxic respiratory failure. He also suffered from severe epistaxis requiring Rhino Rocket.  He was seen by Dr. Verlee Monte during his hospitalization.  During his stay, he had CT chest which was negative for PE but did show a 2.2 x 1.8 cm ill-defined consolidative nodular opacity in the posterior right upper lobe.  He was advised transfer to ICU but declined.  He ended up leaving AMA.  TEST/EVENTS:  12/13/2019 PFTs: FEV1 51%, ratio 60, 12% improvement from saba, DLCOcor 87% 12/13/2019 A1AT MM 140  01/26/2021 CTA chest: No evidence of PE.  Scattered upper normal-sized lymph nodes in the mediastinum and right hilum.  Centrilobular emphysema is noted.  There is a 2.2 x 1.8 cm ill-defined consolidative nodular opacity in the right upper lobe, new since 2019.  7 mm subpleural nodule in the left upper lobe with cluster of similar subpleural nodules in the superior segment of the left lower lobe.  Subtle areas of architectural distortion are noted in the lingula and left lower lobe with peripheral groundglass opacities.  08/31/2021 LDCT chest: No pathologically enlarged mediastinal or hilar lymph nodes.  Small pulmonary nodules are noted in the lungs.  There is a 10.7 nodular opacity in the left upper lobe abutting the pleural surface.  Appears relatively similar compared to the CT chest from 01/2021; although, it is a little more aggressive appearing with slightly spiculated margins.  Lung RADS 4A.   There is mild diffuse bronchial wall thickening.  10/24/2021: Today-overdue follow-up Patient presents today with his wife, [name], for overdue follow-up.  Since his hospitalization, his breathing has been relatively stable. He still gets easily winded with activity. He can't walk from the parking lot into the building without getting short of breath. He's also been struggling with weight loss, around 10 lb. He reports not having much of an appetite either. AM cough is productive and unchanged from his baseline. Denies any fevers, night sweats, hemoptysis, leg swelling, orthopnea. He is on Anoro and does feel like he gets good benefit from it. Rarely uses his albuterol. He had lung cancer screening CT performed 08/31/2021 and ordered by his PCP. He was told that he had a lung nodule but they "weren't concerned about it". He also had colonoscopy due to weight loss and rectal bleeding in March 2023 without any significant findings.   No Known Allergies  Immunization History  Administered Date(s) Administered   Influenza,inj,Quad PF,6+ Mos 10/23/2021   PFIZER(Purple Top)SARS-COV-2 Vaccination 06/30/2019, 07/21/2019    Past Medical History:  Diagnosis Date   Anxiety    Asthma    CAD (coronary artery disease) of bypass graft    a. 11/2017 CABG x 3 (LIMA to LAD, SVG->dRCA, SVG->LCX); b. 07/2019 Cath/PCI: LM nl, LAD 50ost, 90p, 43m LCX 90p/m, RCA 30p, 459m80d (2.0x15 Resolute DES), LIMA->mLAD ok, VG->OM3 40, VG->RPDA 100, EF 60%.   COPD (chronic obstructive pulmonary disease) (HCC)    COPD, severe ([facility]/16/2019   fev1  33% predicted    Daily headache    ETOH abuse    GERD (gastroesophageal reflux disease)    Hyperlipidemia LDL goal <70    Hypertension    [facility] infarction [facility])    Skin cancer    "cut off my eyelid and my neck" (11/12/2017) BCC   Tobacco abuse     Tobacco History: Social History   Tobacco Use  Smoking Status Every Day   Packs/day: 2.50   Years: 35.00   Total pack  years: 87.50   Types: Cigarettes  Smokeless Tobacco Never   Ready to quit: Not Answered Counseling given: Not Answered   Outpatient Medications Prior to Visit  Medication Sig Dispense Refill   acetaminophen (TYLENOL) 500 MG tablet Take 1,000 mg by mouth every 6 (six) hours as needed for moderate pain or headache.     albuterol (VENTOLIN HFA) 108 (90 Base) MCG/ACT inhaler Inhale 2 puffs into the lungs every 6 (six) hours as needed for wheezing or shortness of breath. 1 Inhaler 0   ALPRAZolam (XANAX) 1 MG tablet Take 1 mg by mouth 3 (three) times daily.      ANORO ELLIPTA 62.5-25 MCG/ACT AEPB INHALE 1 PUFF INTO LUNGS EVERY DAY 60 each 0   aspirin 81 MG tablet Take 1 tablet (81 mg total) by mouth daily. 30 tablet 6   atorvastatin (LIPITOR) 80 MG tablet Take 1 tablet (80 mg total) by mouth daily at 6 PM. 30 tablet 1   clopidogrel (PLAVIX) 75 MG tablet TAKE 1 TABLET(75 MG) BY MOUTH DAILY 90 tablet 2   metoprolol tartrate (LOPRESSOR) 25 MG tablet Take 1.5 tablets (37.5 mg total) by mouth 2 (two) times daily. 90 tablet 3   Multiple Vitamin (MULTIVITAMIN WITH MINERALS) TABS tablet Take 1 tablet by mouth daily.     nitroGLYCERIN (NITROSTAT) 0.6 MG SL tablet Place 0.6 mg under the tongue every 5 (five) minutes as needed for chest pain.     pantoprazole (PROTONIX) 40 MG tablet Take 1 tablet by mouth once daily 30 tablet 0   furosemide (LASIX) 40 MG tablet Take 1 tablet (40 mg total) by mouth daily for 3 days. 3 tablet 0   potassium chloride SA (KLOR-CON M) 20 MEQ tablet Take 1 tablet (20 mEq total) by mouth daily. Please take with your lasix 3 tablet 0   sucralfate (CARAFATE) 1 g tablet Take 1 g by mouth 4 (four) times daily.     No facility-administered medications prior to visit.     Review of Systems:   Constitutional: No night sweats, fevers, chills, or lassitude. +weight loss, fatigue  HEENT: No headaches, difficulty swallowing, tooth/dental problems, or sore throat. No sneezing, itching,  ear ache, nasal congestion, or post nasal drip CV:  No chest pain, orthopnea, PND, swelling in lower extremities, anasarca, dizziness, palpitations, syncope Resp: +shortness of breath with exertion; AM cough. No excess mucus or change in color of mucus. No hemoptysis. No wheezing.  No chest wall deformity MSK:  +chronic joint pain. No joint swelling.  No decreased range of motion.  No back pain. Neuro: No dizziness or lightheadedness.  Psych: No depression or anxiety. Mood stable.     Physical Exam:  BP 118/78 (BP Location: Left Arm, Cuff Size: Normal)   Pulse 84   Ht 5' 10.5" (1.791 m)   Wt 140 lb 3.2 oz (63.6 kg)   SpO2 96%   BMI 19.83 kg/m   GEN: Pleasant, interactive, chronically-ill appearing; in no acute distress. HEENT:  Normocephalic  and atraumatic. PERRLA. Sclera white. Nasal turbinates pink, moist and patent bilaterally. No rhinorrhea present. Oropharynx pink and moist, without exudate or edema. No lesions, ulcerations, or postnasal drip.  NECK:  Supple w/ fair ROM. No JVD present. No lymphadenopathy.   CV: RRR, no m/r/g, no peripheral edema. Pulses intact, +2 bilaterally. No cyanosis, pallor or clubbing. PULMONARY:  Unlabored, regular breathing. Diminished bases bilaterally A&P w/o wheezes/rales/rhonchi. No accessory muscle use. No dullness to percussion. GI: BS present and normoactive. Soft, non-tender to palpation. No organomegaly or masses detected.  MSK: No erythema, warmth or tenderness. No deformities or joint swelling noted.  Neuro: A/Ox3. No focal deficits noted.   Skin: Warm, no lesions or rashe Psych: Normal affect and behavior. Judgement and thought content appropriate.     Lab Results:  CBC    Component Value Date/Time   WBC 11.1 (H) 08/23/2021 1946   RBC 3.92 (L) 08/23/2021 1946   HGB 12.3 (L) 08/23/2021 1946   HGB 14.7 10/04/2019 1442   HCT 37.0 (L) 08/23/2021 1946   HCT 42.5 10/04/2019 1442   PLT 237 08/23/2021 1946   PLT 280 10/04/2019 1442    MCV 94.4 08/23/2021 1946   MCV 97 10/04/2019 1442   MCH 31.4 08/23/2021 1946   MCHC 33.2 08/23/2021 1946   RDW 22.6 (H) 08/23/2021 1946   RDW 12.7 10/04/2019 1442   LYMPHSABS 2.3 08/23/2021 1946   MONOABS 0.9 08/23/2021 1946   EOSABS 0.2 08/23/2021 1946   BASOSABS 0.1 08/23/2021 1946    BMET    Component Value Date/Time   NA 140 08/23/2021 1946   NA 140 10/04/2019 1442   K 3.7 08/23/2021 1946   CL 101 08/23/2021 1946   CO2 26 08/23/2021 1946   GLUCOSE 122 (H) 08/23/2021 1946   BUN 5 (L) 08/23/2021 1946   BUN 12 10/04/2019 1442   CREATININE 0.63 08/23/2021 1946   CALCIUM 8.9 08/23/2021 1946   GFRNONAA >60 08/23/2021 1946   GFRAA 96 10/04/2019 1442    BNP No results found for: "BNP"   Imaging:  No results found.       Latest Ref Rng & Units 12/13/2019    2:56 PM 11/13/2017    3:25 PM  PFT Results  FVC-Pre L 3.05  2.20   FVC-Predicted Pre % 60  43   FVC-Post L 3.36    FVC-Predicted Post % 66    Pre FEV1/FVC % % 58  61   Post FEV1/FCV % % 60    FEV1-Pre L 1.78  1.33   FEV1-Predicted Pre % 45  33   FEV1-Post L 2.01    DLCO uncorrected ml/min/mmHg 22.22    DLCO UNC% % 76    DLCO corrected ml/min/mmHg 22.10    DLCO COR %Predicted % 75    DLVA Predicted % 87    TLC L 5.41    TLC % Predicted % 77    RV % Predicted % 103      No results found for: "NITRICOXIDE"      Assessment & Plan:   Lung nodule Concerning left upper lobe nodule measuring 10.7 mm on lung cancer screening CT from July 2023. It is slightly more aggressive in appearance when compared to December 2022 scan but not significantly changed in size. Considered Lung RADS 4A. Discussed potential next steps including PET scan, super D CT chest with bronchoscopy, or least aggressive option with repeat CT in one month for 3 month follow up. Given his weight loss, would  advise urgent evaluation vs waiting. He was agreeable to moving forward with further workup. Will discuss case with Dr. Verlee Monte to  determine preferred next steps.  Patient Instructions  Continue Anoro 1 puff daily Continue Albuterol inhaler 2 puffs every 6 hours as needed for shortness of breath or wheezing. Notify if symptoms persist despite rescue inhaler/neb use.   I will talk to Dr. Verlee Monte about next steps regarding your lung nodule and contact you to set up either PET scan or bronchoscopy, like we discussed today. We will set up a follow up appointment once I hear back from him.     COPD  GOLD II Moderately severe COPD with high symptom burden. Concerned his progressive dyspnea could be related to underlying lung malignancy given his chest imaging and symptoms. See above plan. May also consider step up to triple therapy regimen at follow up given his reversibility on past PFTs.    I spent 38 minutes of dedicated to the care of this patient on the date of this encounter to include pre-visit review of records, face-to-face time with the patient discussing conditions above, post visit ordering of testing, clinical documentation with the electronic health record, making appropriate referrals as documented, and communicating necessary findings to members of the patients care team.  Clayton Bibles, NP 10/24/2021  Pt aware and understands NP's role.

## 2021-10-24 ENCOUNTER — Other Ambulatory Visit: Payer: Self-pay | Admitting: Student

## 2021-10-24 ENCOUNTER — Encounter: Payer: Self-pay | Admitting: Nurse Practitioner

## 2021-10-24 DIAGNOSIS — R911 Solitary pulmonary nodule: Secondary | ICD-10-CM

## 2021-10-24 NOTE — Progress Notes (Unsigned)
CT Chest Super D ordered

## 2021-10-24 NOTE — Progress Notes (Signed)
10/24/2021: Discussed case with Dr. Verlee Monte. Plan for super D CT chest for evaluation of lung nodule and determine need for bronchoscopy/biopsy after results are reviewed.

## 2021-10-24 NOTE — Assessment & Plan Note (Signed)
Concerning left upper lobe nodule measuring 10.7 mm on lung cancer screening CT from July 2023. It is slightly more aggressive in appearance when compared to December 2022 scan but not significantly changed in size. Considered Lung RADS 4A. Discussed potential next steps including PET scan, super D CT chest with bronchoscopy, or least aggressive option with repeat CT in one month for 3 month follow up. Given his weight loss, would advise urgent evaluation vs waiting. He was agreeable to moving forward with further workup. Will discuss case with Dr. Verlee Monte to determine preferred next steps.  Patient Instructions  Continue Anoro 1 puff daily Continue Albuterol inhaler 2 puffs every 6 hours as needed for shortness of breath or wheezing. Notify if symptoms persist despite rescue inhaler/neb use.   I will talk to Dr. Verlee Monte about next steps regarding your lung nodule and contact you to set up either PET scan or bronchoscopy, like we discussed today. We will set up a follow up appointment once I hear back from him.

## 2021-10-24 NOTE — Progress Notes (Signed)
Spoke with patient's spouse, [name] (DPR), regarding next steps for his lung nodule and plan for super D CT chest. Verbalized understanding and all questions answered.

## 2021-10-24 NOTE — Assessment & Plan Note (Signed)
Moderately severe COPD with high symptom burden. Concerned his progressive dyspnea could be related to underlying lung malignancy given his chest imaging and symptoms. See above plan. May also consider step up to triple therapy regimen at follow up given his reversibility on past PFTs.

## 2021-11-01 ENCOUNTER — Other Ambulatory Visit (HOSPITAL_COMMUNITY): Payer: Medicaid Other

## 2021-11-08 ENCOUNTER — Ambulatory Visit (HOSPITAL_COMMUNITY)
Admission: RE | Admit: 2021-11-08 | Discharge: 2021-11-08 | Disposition: A | Discharge status: Home / Self Care | Payer: Medicaid Other | Source: Ambulatory Visit | Attending: Student | Admitting: Student

## 2021-11-08 DIAGNOSIS — R911 Solitary pulmonary nodule: Secondary | ICD-10-CM | POA: Diagnosis present

## 2021-11-20 NOTE — Progress Notes (Unsigned)
Synopsis: Referred for pulmonary nodule by No ref. provider found  Subjective:   PATIENT ID: [place]: male DOB: [birth year], MRN: [id number]  No chief complaint on file.  51yM with history of NSTEMI, CAD, hypertension, GERD, EtOH use, HLD. Seen by me during hospitalization 12/22-12/23/23 for AHRF and epistaxis.   Maintained on anoro. Last seen by Roxan Diesel 9/19 and planned super D CT Chest for follow up of 1cm LUL nodule which was stable.   Otherwise pertinent review of systems is negative.  Past Medical History:  Diagnosis Date   Anxiety    Asthma    CAD (coronary artery disease) of bypass graft    a. 11/2017 CABG x 3 (LIMA to LAD, SVG->dRCA, SVG->LCX); b. 07/2019 Cath/PCI: LM nl, LAD 50ost, 90p, 56m LCX 90p/m, RCA 30p, 448m80d (2.0x15 Resolute DES), LIMA->mLAD ok, VG->OM3 40, VG->RPDA 100, EF 60%.   COPD (chronic obstructive pulmonary disease) (HCC)    COPD, severe ([facility]/[place]   fev1 33% predicted    Daily headache    ETOH abuse    GERD (gastroesophageal reflux disease)    Hyperlipidemia LDL goal <70    Hypertension    [facility]   Skin cancer    "cut off my eyelid and my neck" (11/12/2017) BCC   Tobacco abuse      Family History  Problem Relation Age of Onset   CAD Mother    CAD Maternal Grandmother    CAD Maternal Grandfather    CAD Paternal Grandmother    CAD Paternal Grandfather    Colon polyps Neg Hx    Esophageal cancer Neg Hx    Rectal cancer Neg Hx    Stomach cancer Neg Hx      Past Surgical History:  Procedure Laterality Date   COLONOSCOPY WITH PROPOFOL N/A 04/04/2021   Procedure: COLONOSCOPY WITH PROPOFOL;  Surgeon: Toledo, TeBenay PikeMD;  Location: [facility] ENDOSCOPY;  Service: Gastroenterology;  Laterality: N/A;   CORONARY ARTERY BYPASS GRAFT N/A 11/21/2017   Procedure: CORONARY ARTERY BYPASS GRAFTING (CABG) times 3 using left internal mammary artery and right greater saphenous vein harvested endscopically. LIMA to  LAD, SVG to Distal Right, SVG to Circ.;  Surgeon: GeGrace IsaacMD;  [facility];  Laterality: N/A;   CORONARY STENT INTERVENTION N/A 07/16/2019   Procedure: CORONARY STENT INTERVENTION;  Surgeon: KeTroy SineMD;  [facility];  Service: Cardiovascular;  Laterality: N/A;   ESOPHAGOGASTRODUODENOSCOPY N/A 04/04/2021   Procedure: ESOPHAGOGASTRODUODENOSCOPY (EGD);  Surgeon: Toledo, TeBenay PikeMD;  Location: [facility] ENDOSCOPY;  Service: Gastroenterology;  Laterality: N/A;   LACERATION REPAIR     "had staples put in my head" (11/12/2017)   LEFT HEART CATH AND CORONARY ANGIOGRAPHY N/A 11/13/2017   Procedure: LEFT HEART CATH AND CORONARY ANGIOGRAPHY;  Surgeon: KeTroy SineMD;  [facility];  Service: Cardiovascular;  Laterality: N/A;   LEFT HEART CATH AND CORS/GRAFTS ANGIOGRAPHY N/A 07/16/2019   Procedure: LEFT HEART CATH AND CORS/GRAFTS ANGIOGRAPHY;  Surgeon: KeTroy SineMD;  [facility];  Service: Cardiovascular;  Laterality: N/A;   SKIN CANCER EXCISION     "cut off my eyelid and my neck" (11/12/2017)   TEE WITHOUT CARDIOVERSION N/A 11/21/2017   Procedure: TRANSESOPHAGEAL ECHOCARDIOGRAM (TEE);  Surgeon: GeGrace IsaacMD;  [place] Service: Open [facility] Surgery;  Laterality: N/A;    Social History   Socioeconomic History  Marital status: Soil scientist    Spouse name: Not on file   Number of children: 2   Years of education: Not on file   Highest education level: Not on file  Occupational History   Occupation: unemployed  Tobacco Use   Smoking status: Every Day    Packs/day: 2.50    Years: 35.00    Total pack years: 87.50    Types: Cigarettes   Smokeless tobacco: Never  Vaping Use   Vaping Use: Never used  Substance and Sexual Activity   Alcohol use: Not Currently   Drug use: Not Currently    Types: Marijuana    Comment: 11/12/2017 "qd"   Sexual activity: Not on file  Other  Topics Concern   Not on file  Social History Narrative   Right handed   Lives with family    Social Determinants of Health   Financial Resource Strain: Not on file  Food Insecurity: Not on file  Transportation Needs: Not on file  Physical Activity: Not on file  Stress: Not on file  Social Connections: Not on file  Intimate Partner Violence: Not on file     No Known Allergies   Outpatient Medications Prior to Visit  Medication Sig Dispense Refill   acetaminophen (TYLENOL) 500 MG tablet Take 1,000 mg by mouth every 6 (six) hours as needed for moderate pain or headache.     albuterol (VENTOLIN HFA) 108 (90 Base) MCG/ACT inhaler Inhale 2 puffs into the lungs every 6 (six) hours as needed for wheezing or shortness of breath. 1 Inhaler 0   ALPRAZolam (XANAX) 1 MG tablet Take 1 mg by mouth 3 (three) times daily.      ANORO ELLIPTA 62.5-25 MCG/ACT AEPB INHALE 1 PUFF INTO LUNGS EVERY DAY 60 each 0   aspirin 81 MG tablet Take 1 tablet (81 mg total) by mouth daily. 30 tablet 6   atorvastatin (LIPITOR) 80 MG tablet Take 1 tablet (80 mg total) by mouth daily at 6 PM. 30 tablet 1   clopidogrel (PLAVIX) 75 MG tablet TAKE 1 TABLET(75 MG) BY MOUTH DAILY 90 tablet 2   furosemide (LASIX) 40 MG tablet Take 1 tablet (40 mg total) by mouth daily for 3 days. 3 tablet 0   metoprolol tartrate (LOPRESSOR) 25 MG tablet Take 1.5 tablets (37.5 mg total) by mouth 2 (two) times daily. 90 tablet 3   Multiple Vitamin (MULTIVITAMIN WITH MINERALS) TABS tablet Take 1 tablet by mouth daily.     nitroGLYCERIN (NITROSTAT) 0.6 MG SL tablet Place 0.6 mg under the tongue every 5 (five) minutes as needed for chest pain.     pantoprazole (PROTONIX) 40 MG tablet Take 1 tablet by mouth once daily 30 tablet 0   potassium chloride SA (KLOR-CON M) 20 MEQ tablet Take 1 tablet (20 mEq total) by mouth daily. Please take with your lasix 3 tablet 0   sucralfate (CARAFATE) 1 g tablet Take 1 g by mouth 4 (four) times daily.     No  facility-administered medications prior to visit.       Objective:   Physical Exam:  General appearance: 51 y.o., male, NAD, conversant  Eyes: anicteric sclerae; PERRL, tracking appropriately HENT: NCAT; MMM Neck: Trachea midline; no lymphadenopathy, no JVD Lungs: CTAB, no crackles, no wheeze, with normal respiratory effort CV: RRR, no murmur  Abdomen: Soft, non-tender; non-distended, BS present  Extremities: No peripheral edema, warm Skin: Normal turgor and texture; no rash Psych: Appropriate affect Neuro: Alert and oriented to person  and place, no focal deficit     There were no vitals filed for this visit.   on *** LPM *** RA BMI Readings from Last 3 Encounters:  10/23/21 19.83 kg/m  08/23/21 20.22 kg/m  04/04/21 20.51 kg/m   Wt Readings from Last 3 Encounters:  10/23/21 140 lb 3.2 oz (63.6 kg)  08/23/21 145 lb (65.8 kg)  04/04/21 145 lb (65.8 kg)     CBC    Component Value Date/Time   WBC 11.1 (H) 08/23/2021 1946   RBC 3.92 (L) 08/23/2021 1946   HGB 12.3 (L) 08/23/2021 1946   HGB 14.7 10/04/2019 1442   HCT 37.0 (L) 08/23/2021 1946   HCT 42.5 10/04/2019 1442   PLT 237 08/23/2021 1946   PLT 280 10/04/2019 1442   MCV 94.4 08/23/2021 1946   MCV 97 10/04/2019 1442   MCH 31.4 08/23/2021 1946   MCHC 33.2 08/23/2021 1946   RDW 22.6 (H) 08/23/2021 1946   RDW 12.7 10/04/2019 1442   LYMPHSABS 2.3 08/23/2021 1946   MONOABS 0.9 08/23/2021 1946   EOSABS 0.2 08/23/2021 1946   BASOSABS 0.1 08/23/2021 1946    Eos 200  Chest Imaging: CT Chest Super D 11/08/21: stable LUL nodule, emphysema, bronchial wall thickening   Pulmonary Functions Testing Results:    Latest Ref Rng & Units 12/13/2019    2:56 PM 11/13/2017    3:25 PM  PFT Results  FVC-Pre L 3.05  2.20   FVC-Predicted Pre % 60  43   FVC-Post L 3.36    FVC-Predicted Post % 66    Pre FEV1/FVC % % 58  61   Post FEV1/FCV % % 60    FEV1-Pre L 1.78  1.33   FEV1-Predicted Pre % 45  33   FEV1-Post L  2.01    DLCO uncorrected ml/min/mmHg 22.22    DLCO UNC% % 76    DLCO corrected ml/min/mmHg 22.10    DLCO COR %Predicted % 75    DLVA Predicted % 87    TLC L 5.41    TLC % Predicted % 77    RV % Predicted % 103     Moderately severe obstruction with BD response, air trapping, very mildly reduced diffusing capacity  FeNO: ***  Pathology: ***  Echocardiogram: ***  Heart Catheterization: ***    Assessment & Plan:    Plan:      Maryjane Hurter, MD [facility] 11/20/2021 10:13 AM

## 2021-11-21 ENCOUNTER — Ambulatory Visit (INDEPENDENT_AMBULATORY_CARE_PROVIDER_SITE_OTHER): Payer: Medicaid Other | Admitting: Student

## 2021-11-21 ENCOUNTER — Encounter: Payer: Self-pay | Admitting: Student

## 2021-11-21 VITALS — BP 104/60 | HR 88 | Temp 98.1°F | Ht 70.5 in | Wt 141.6 lb

## 2021-11-21 DIAGNOSIS — J449 Chronic obstructive pulmonary disease, unspecified: Secondary | ICD-10-CM

## 2021-11-21 DIAGNOSIS — F17200 Nicotine dependence, unspecified, uncomplicated: Secondary | ICD-10-CM | POA: Diagnosis not present

## 2021-11-21 DIAGNOSIS — R911 Solitary pulmonary nodule: Secondary | ICD-10-CM | POA: Diagnosis not present

## 2021-11-21 MED ORDER — TRELEGY ELLIPTA 200-62.5-25 MCG/ACT IN AEPB: 1.0000 | INHALATION_SPRAY | Freq: Every day | RESPIRATORY_TRACT | 0 refills | Status: DC

## 2021-11-21 NOTE — Patient Instructions (Signed)
-   stop anoro - start trelegy tomorrow 1 puff once daily, send message or call if you prefer this to anoro and we'll prescribe it - try to get appointment with Dr. Harrington Challenger  - see you tentatively in late July or sooner if need be!

## 2021-11-22 ENCOUNTER — Telehealth: Payer: Self-pay | Admitting: Physician Assistant

## 2021-11-22 NOTE — Telephone Encounter (Signed)
I spoke with the pts wife and she reports that the pt has been complaining of bilateral lower extremity edema up to his calves which are very tight and painful to walk on.... she says she has not noticed any redness and no heat. She says they have been swollen for a long time but has been getting worse over the past week.... he does not watch his NA intake and he does not elevate when sitting... he sits with his legs crisscrossed usually.   She he saw Pulmonary yesterday for his COPD so he does not know if he is having worsening SOB since he always has breathing issues... but they started him on a new inhaler.   He can lay flat at night to sleep and for now he is not walking around the house much.   I asked her if they would consider an Urgent care visit but she says he declines all visits but agrees that she can bring him when she is out of work next... she says she can not get out of work to bring him today or tomorrow but the [facility] made him an appt this Monday an if anything changes or worsens she will take him to the ED.

## 2021-11-22 NOTE — Telephone Encounter (Signed)
Pt c/o swelling: STAT is pt has developed SOB within 24 hours  If swelling, where is the swelling located? Feet and legs  How much weight have you gained and in what time span? no  Have you gained 3 pounds in a day or 5 pounds in a week? no  Do you have a log of your daily weights (if so, list)?   Are you currently taking a fluid pill? no  Are you currently SOB?  Yes, he suffers with COPD also  Have you traveled recently? No--patient wanted to be seen- made him appointment for Monday with Ambrose Pancoast

## 2021-11-25 NOTE — Progress Notes (Deleted)
Office Visit    Patient Name: Greg [name] Date of Encounter: 11/25/2021  Primary Care Provider:  Elyse Jarvis, FNP (Inactive) Primary Cardiologist:  Dorris Carnes, MD Primary Electrophysiologist: None  Chief Complaint    Greg [name] is a 51 y.o. male with PMH of CAD s/p CABG x3 (LIMA to LAD, SVG to the RCA, SVG to LCx) 2018 [place] 2021 with DES to RCA, COPD, tobacco abuse, HTN, HLD who presents today for lower extremity edema.  Past Medical History    Past Medical History:  Diagnosis Date   Anxiety    Asthma    CAD (coronary artery disease) of bypass graft    a. 11/2017 CABG x 3 (LIMA to LAD, SVG->dRCA, SVG->LCX); b. 07/2019 Cath/PCI: LM nl, LAD 50ost, 90p, 71m LCX 90p/m, RCA 30p, 453m80d (2.0x15 Resolute DES), LIMA->mLAD ok, VG->OM3 40, VG->RPDA 100, EF 60%.   COPD (chronic obstructive pulmonary disease) (HCC)    COPD, severe ([place]   fev1 33% predicted    Daily headache    ETOH abuse    GERD (gastroesophageal reflux disease)    Hyperlipidemia LDL goal <70    Hypertension    [facility]   Skin cancer    "cut off my eyelid and my neck" (11/12/2017) BCC   Tobacco abuse    Past Surgical History:  Procedure Laterality Date   COLONOSCOPY WITH PROPOFOL N/A 04/04/2021   Procedure: COLONOSCOPY WITH PROPOFOL;  Surgeon: Toledo, TeBenay PikeMD;  Location: [facility] ENDOSCOPY;  Service: Gastroenterology;  Laterality: N/A;   CORONARY ARTERY BYPASS GRAFT N/A 11/21/2017   Procedure: CORONARY ARTERY BYPASS GRAFTING (CABG) times 3 using left internal mammary artery and right greater saphenous vein harvested endscopically. LIMA to LAD, SVG to Distal Right, SVG to Circ.;  Surgeon: GeGrace IsaacMD;  [place] [facility] [place] [facility]: [facility];  Laterality: N/A;   CORONARY STENT INTERVENTION N/A 07/16/2019   Procedure: CORONARY STENT INTERVENTION;  Surgeon: KeTroy SineMD;  [place];  Service: Cardiovascular;  Laterality:  N/A;   ESOPHAGOGASTRODUODENOSCOPY N/A 04/04/2021   Procedure: ESOPHAGOGASTRODUODENOSCOPY (EGD);  Surgeon: Toledo, TeBenay PikeMD;  Location: [facility] ENDOSCOPY;  Service: Gastroenterology;  Laterality: N/A;   LACERATION REPAIR     "had staples put in my head" (11/12/2017)   LEFT HEART CATH AND CORONARY ANGIOGRAPHY N/A 11/13/2017   Procedure: LEFT HEART CATH AND CORONARY ANGIOGRAPHY;  Surgeon: KeTroy SineMD;  [facility];  Service: Cardiovascular;  Laterality: N/A;   LEFT HEART CATH AND CORS/GRAFTS ANGIOGRAPHY N/A 07/16/2019   Procedure: LEFT HEART CATH AND CORS/GRAFTS ANGIOGRAPHY;  Surgeon: KeTroy SineMD;  [place];  Service: Cardiovascular;  Laterality: N/A;   SKIN CANCER EXCISION     "cut off my eyelid and my neck" (11/12/2017)   TEE WITHOUT CARDIOVERSION N/A 11/21/2017   Procedure: TRANSESOPHAGEAL ECHOCARDIOGRAM (TEE);  Surgeon: GeGrace IsaacMD;  [place] [facility] Service: Open [facility] Surgery;  Laterality: N/A;    Allergies  No Known Allergies  History of Present Illness    Greg [name] a 89ear old male  with the above mention past medical history who presents today for complaint of lower extremity edema.  Mr. SmBanwartas an extensive coronary history that dates back to 2018 when he underwent CABG x3.  He has been followed by Dr. RoHarrington Challengerince 2019 and was last seen 02/2020 for follow-up.  During his visit breathing was stable with no edema present.  He was chest pain-free and blood pressure was well controlled.  2D echo was last completed 2019 with EF of 60-65%, no RWMA, grade 1 DD, and normal valve function.  He was last seen by Nicholes Rough, PA on 03/2021 for surgical clearance for colonoscopy.  Patient denies any chest pain during visit and continues to smoke 1 to 2 cigarettes a day with severe shortness of breath.  Since last being seen in the office patient reports***.  Patient denies chest pain, palpitations, dyspnea, PND, orthopnea,  nausea, vomiting, dizziness, syncope, edema, weight gain, or early satiety.   ***Notes:  Home Medications    Current Outpatient Medications  Medication Sig Dispense Refill   acetaminophen (TYLENOL) 500 MG tablet Take 1,000 mg by mouth every 6 (six) hours as needed for moderate pain or headache.     albuterol (VENTOLIN HFA) 108 (90 Base) MCG/ACT inhaler Inhale 2 puffs into the lungs every 6 (six) hours as needed for wheezing or shortness of breath. 1 Inhaler 0   ALPRAZolam (XANAX) 1 MG tablet Take 1 mg by mouth 3 (three) times daily.      ANORO ELLIPTA 62.5-25 MCG/ACT AEPB INHALE 1 PUFF INTO LUNGS EVERY DAY 60 each 0   aspirin 81 MG tablet Take 1 tablet (81 mg total) by mouth daily. 30 tablet 6   atorvastatin (LIPITOR) 80 MG tablet Take 1 tablet (80 mg total) by mouth daily at 6 PM. 30 tablet 1   clopidogrel (PLAVIX) 75 MG tablet TAKE 1 TABLET(75 MG) BY MOUTH DAILY 90 tablet 2   Fluticasone-Umeclidin-Vilant (TRELEGY ELLIPTA) 200-62.5-25 MCG/ACT AEPB Inhale 1 puff into the lungs daily. 14 each 0   metoprolol tartrate (LOPRESSOR) 25 MG tablet Take 1.5 tablets (37.5 mg total) by mouth 2 (two) times daily. 90 tablet 3   Multiple Vitamin (MULTIVITAMIN WITH MINERALS) TABS tablet Take 1 tablet by mouth daily.     nitroGLYCERIN (NITROSTAT) 0.6 MG SL tablet Place 0.6 mg under the tongue every 5 (five) minutes as needed for chest pain.     pantoprazole (PROTONIX) 40 MG tablet Take 1 tablet by mouth once daily 30 tablet 0   No current facility-administered medications for this visit.     Review of Systems  Please see the history of present illness.    (+)*** (+)***  All other systems reviewed and are otherwise negative except as noted above.  Physical Exam    Wt Readings from Last 3 Encounters:  11/21/21 141 lb 9.6 oz (64.2 kg)  10/23/21 140 lb 3.2 oz (63.6 kg)  08/23/21 145 lb (65.8 kg)   ZO:XWRUE were no vitals filed for this visit.,There is no height or weight on file to calculate  BMI.  Constitutional:      Appearance: Healthy appearance. Not in distress.  Neck:     Vascular: JVD normal.  Pulmonary:     Effort: Pulmonary effort is normal.     Breath sounds: No wheezing. No rales. Diminished in the bases Cardiovascular:     Normal rate. Regular rhythm. Normal S1. Normal S2.      Murmurs: There is no murmur.  Edema:    Peripheral edema absent.  Abdominal:     Palpations: Abdomen is soft non tender. There is no hepatomegaly.  Skin:    General: Skin is warm and dry.  Neurological:     General: No focal deficit present.     Mental Status: Alert and oriented to person,  place and time.     Cranial Nerves: Cranial nerves are intact.  EKG/LABS/Other Studies Reviewed    ECG personally reviewed by me today - ***  Risk Assessment/Calculations:   {Does this patient have ATRIAL FIBRILLATION?:[phone]}        Lab Results  Component Value Date   WBC 11.1 (H) 08/23/2021   HGB 12.3 (L) 08/23/2021   HCT 37.0 (L) 08/23/2021   MCV 94.4 08/23/2021   PLT 237 08/23/2021   Lab Results  Component Value Date   CREATININE 0.63 08/23/2021   BUN 5 (L) 08/23/2021   NA 140 08/23/2021   K 3.7 08/23/2021   CL 101 08/23/2021   CO2 26 08/23/2021   Lab Results  Component Value Date   ALT 46 (H) 08/23/2021   AST 118 (H) 08/23/2021   ALKPHOS 161 (H) 08/23/2021   BILITOT 1.1 08/23/2021   Lab Results  Component Value Date   CHOL 221 (H) 10/04/2019   HDL 34 (L) 10/04/2019   LDLCALC Comment (A) 10/04/2019   TRIG 1,254 (HH) 10/04/2019   CHOLHDL 6.5 (H) 10/04/2019    Lab Results  Component Value Date   HGBA1C 5.5 11/19/2017    Assessment & Plan    1.CAD: -s/p CABG x 3 and PCI in 2021 -Today patient reports*** -Continue GDMT with Plavix 75 mg, ASA 81 mg, metoprolol 37.5 mg twice daily   2.  Lower extremity edema: -2D echo was completed last in 2022 with EF of 60-65%, no RWMA, grade 1 DD, and normal valve function. -Today patient***  3.   Hyperlipidemia: -Patient's last LDL cholesterol was -Continue Lipitor 80 mg daily  4.  Hypertension: -Patient's blood pressure today was*** -Continue metoprolol 37.5 mg daily  5.  COPD/tobacco abuse: -Poorly controlled and currently followed by pulmonology -Patient continues to***      Disposition: Follow-up with Dorris Carnes, MD or APP in *** months {Are you ordering a CV Procedure (e.g. stress test, cath, DCCV, TEE, etc)?   Press F2        :[id number]}   Medication Adjustments/Labs and Tests Ordered: Current medicines are reviewed at length with the patient today.  Concerns regarding medicines are outlined above.   Signed, Mable Fill, Marissa Nestle, NP 11/25/2021, 5:02 PM [facility]  Note:  This document was prepared using Dragon voice recognition software and may include unintentional dictation errors.

## 2021-11-26 ENCOUNTER — Ambulatory Visit: Payer: Medicaid Other | Admitting: Nurse Practitioner

## 2021-11-26 DIAGNOSIS — R600 Localized edema: Secondary | ICD-10-CM

## 2021-11-28 ENCOUNTER — Emergency Department (HOSPITAL_COMMUNITY)
Admission: EM | Admit: 2021-11-28 | Discharge: 2021-11-29 | Discharge status: Against Medical Advice | Payer: Medicaid Other | Attending: Emergency Medicine | Admitting: Emergency Medicine

## 2021-11-28 ENCOUNTER — Emergency Department (HOSPITAL_COMMUNITY): Payer: Medicaid Other

## 2021-11-28 ENCOUNTER — Encounter (HOSPITAL_COMMUNITY): Payer: Self-pay | Admitting: Emergency Medicine

## 2021-11-28 DIAGNOSIS — Z951 Presence of aortocoronary bypass graft: Secondary | ICD-10-CM | POA: Insufficient documentation

## 2021-11-28 DIAGNOSIS — M549 Dorsalgia, unspecified: Secondary | ICD-10-CM | POA: Diagnosis not present

## 2021-11-28 DIAGNOSIS — Z5321 Procedure and treatment not carried out due to patient leaving prior to being seen by health care provider: Secondary | ICD-10-CM | POA: Insufficient documentation

## 2021-11-28 DIAGNOSIS — R079 Chest pain, unspecified: Secondary | ICD-10-CM | POA: Diagnosis present

## 2021-11-28 DIAGNOSIS — M79606 Pain in leg, unspecified: Secondary | ICD-10-CM | POA: Insufficient documentation

## 2021-11-28 LAB — CBC
HCT: 42.8 % (ref 39.0–52.0)
Hemoglobin: 14.6 g/dL (ref 13.0–17.0)
MCH: 34.6 pg — ABNORMAL HIGH (ref 26.0–34.0)
MCHC: 34.1 g/dL (ref 30.0–36.0)
MCV: 101.4 fL — ABNORMAL HIGH (ref 80.0–100.0)
Platelets: 320 10*3/uL (ref 150–400)
RBC: 4.22 MIL/uL (ref 4.22–5.81)
RDW: 18.9 % — ABNORMAL HIGH (ref 11.5–15.5)
WBC: 11.2 10*3/uL — ABNORMAL HIGH (ref 4.0–10.5)
nRBC: 0 % (ref 0.0–0.2)

## 2021-11-28 LAB — BASIC METABOLIC PANEL
Anion gap: 11 (ref 5–15)
BUN: 8 mg/dL (ref 6–20)
CO2: 24 mmol/L (ref 22–32)
Calcium: 8.6 mg/dL — ABNORMAL LOW (ref 8.9–10.3)
Chloride: 104 mmol/L (ref 98–111)
Creatinine, Ser: 0.43 mg/dL — ABNORMAL LOW (ref 0.61–1.24)
GFR, Estimated: >60 mL/min (ref >60)
Glucose, Bld: 89 mg/dL (ref 70–99)
Potassium: 4.3 mmol/L (ref 3.5–5.1)
Sodium: 139 mmol/L (ref 135–145)

## 2021-11-28 LAB — TROPONIN I (HIGH SENSITIVITY): Troponin I (High Sensitivity): 14 ng/L (ref <18)

## 2021-11-28 MED ORDER — IOHEXOL 350 MG/ML SOLN
80.0000 mL | Freq: Once | INTRAVENOUS | Status: AC | PRN
  Administered 2021-11-28: 80 mL via INTRAVENOUS

## 2021-11-28 NOTE — ED Provider Triage Note (Signed)
Emergency Medicine Provider Triage Evaluation Note  Greg [name] , a 52 y.o. male  was evaluated in triage.  Pt complains of intermittent chest pain, leg pain, and back pain.  Yesterday had chest pain at the same time his legs were feeling "numb".  Denies pain, which he believes is due to his son and how he picks up up to change positions.  Walks with cane at baseline, however now also with perceived worse coordination of his arms and legs, worse than his normal baseline.  Denies shortness of breath or fevers.  Significant cardiac history including CABG in 2019.  Review of Systems  Positive:  Negative: See above  Physical Exam  BP 110/72   Pulse 93   Temp 98.8 F (37.1 C) (Oral)   Resp 15   Ht 5' 10.5" (1.791 m)   Wt 64 kg   SpO2 96%   BMI 19.96 kg/m  Gen:   Awake, no distress   Resp:  Normal effort  MSK:   Moves extremities without difficulty  Other:  Decreased coordination and strength of lower extremities, unknown baseline.  Mild discoloration of right hand, appears more pale.  Chest non-TTP.  Back with mild tenderness.  Medical Decision Making  Medically screening exam initiated at 3:29 PM.  Appropriate orders placed.  FABRICE DYAL was informed that the remainder of the evaluation will be completed by another provider, this initial triage assessment does not replace that evaluation, and the importance of remaining in the ED until their evaluation is complete.  Discussed patient with triage nurse.  Concern for possible aneurysm VS dissection, imaging and labs ordered.   Prince Rome, PA-C 56/81/27 1541

## 2021-11-28 NOTE — ED Triage Notes (Signed)
Pt arrives via [facility] from home with back pain and numbness for 5-6 days. Walks with cane at baseline.

## 2021-11-29 ENCOUNTER — Telehealth: Payer: Self-pay | Admitting: Internal Medicine

## 2021-11-29 ENCOUNTER — Telehealth: Payer: Self-pay | Admitting: Licensed Clinical Social Worker

## 2021-11-29 LAB — TROPONIN I (HIGH SENSITIVITY): Troponin I (High Sensitivity): 14 ng/L (ref <18)

## 2021-11-29 NOTE — Telephone Encounter (Signed)
H&V Care Navigation CSW Progress Note  Clinical Social Worker  contacted pt by mail  to f/u on challenges noted with pt transportation per Alden Hipp, Therapist, sports. Pt has managed Medicaid and also could potentially be eligible for rides through [facility] system. I have mailed information from [place] and [employer] to pt home address.  Patient is participating in a Managed Medicaid Plan:  Yes- [place]   Transportation Needs: Unmet Transportation Needs (11/29/2021)  Tobacco Use: High Risk (11/28/2021)    Westley Hummer, MSW, [place]  [phone]- work cell phone (preferred) [phone]- desk phone

## 2021-11-29 NOTE — Telephone Encounter (Signed)
Pt's spouse would like a callback regarding pt currently being at the ED. Please advise

## 2021-11-29 NOTE — Telephone Encounter (Addendum)
I called the pts wife back and they are back at home. She says he is very stubborn and will not go back. I advised her that I will talk with Dr Harrington Challenger and call her back.   I offered to call our social worker and see if we can help with transporting him but she declined and says she is a nurses aid and she can get him in and out of the car and house but he can sit anywhere for a long period of time.   I advised the pts wife and she says she has to work at 2 pm and trying to rest for now... I urged her to talk to him and consider him going to their local hosp via EMS and they can keep in touch with her while she is at work... she will call back and let me know their plans and after he gets some relief of his leg pain we will plan for the Echo.   She says that his edema has improved since he had been sitting in the ED and had his legs were elevated...  but he is still in a lot of pain.. I also urged her to call his PCP per Dr Harrington Challenger' recommendations.

## 2021-11-29 NOTE — ED Notes (Signed)
Pt informed that he has a bed. Pt wants his IV taken out and wants to leave. This RN asked pt several times to clarify that pt has room and he does not "give a damn" he wants to go home.

## 2021-11-29 NOTE — Telephone Encounter (Signed)
I spoke with the pts wife and she reports that the pt has been sitting in the ED for 17 hours and is now demanding to leave... she says he cannot walk at all that his lower extremities are so swollen and painful... he cancelled his appt 11/26/21 because she says he cannot ambulate and she could not get him here on her own.   He is very upset right now and I heard him raising his voice in the background saying he is "leaving the ED".... I asked her to talk to the nursing staff and see if they could help her talk him into staying but she says she already did that once and she is too tired herself and were leaving.   I asked her to try and do what they could that after waiting this many hours they should try and hold off on leaving and that he needs assessment and this is his best option since he cannot ambulate at all and she says she cannot handle moving him on her own... but she got very upset and hung up on me.

## 2021-11-29 NOTE — Telephone Encounter (Signed)
After talking with Dr Harrington Challenger... pt needs to be seen by his PCP... for Cardio he needs an Echo and OV after the Echo.   According to our Social worker advice... pt should be reach to his [facility] prepaid plan as he may qualify for transportation service and she will mail out info to them.

## 2021-11-30 ENCOUNTER — Telehealth: Payer: Self-pay | Admitting: Internal Medicine

## 2021-11-30 NOTE — Telephone Encounter (Signed)
  Pt's wife calling back, she would like to speak with Dr. Alan Ripper nurse again. F/U from their conversation yesterday

## 2021-11-30 NOTE — Telephone Encounter (Signed)
The patients wife want to let Dr. Harrington Challenger and Lelon Frohlich know that they were going to take Greg [name] back to the ER tomorrow. Talked to them about PCP, Cardiac testing and OV mentioned in other encounter. Wife stated that the patient can not go anywhere right now because he can't walk and the only place EMS with take them is the hospital.

## 2022-01-10 ENCOUNTER — Other Ambulatory Visit: Payer: Self-pay | Admitting: Internal Medicine

## 2022-02-09 ENCOUNTER — Other Ambulatory Visit: Payer: Self-pay | Admitting: Internal Medicine

## 2022-02-11 ENCOUNTER — Other Ambulatory Visit: Payer: Self-pay

## 2022-02-11 MED ORDER — CLOPIDOGREL BISULFATE 75 MG PO TABS: 75.0000 mg | ORAL_TABLET | Freq: Every day | ORAL | 1 refills | Status: DC

## 2022-03-30 ENCOUNTER — Other Ambulatory Visit: Payer: Self-pay | Admitting: Internal Medicine

## 2022-06-23 ENCOUNTER — Other Ambulatory Visit: Payer: Self-pay | Admitting: Internal Medicine

## 2022-07-12 ENCOUNTER — Other Ambulatory Visit: Payer: Self-pay | Admitting: Internal Medicine

## 2022-07-30 ENCOUNTER — Other Ambulatory Visit: Payer: Self-pay | Admitting: Internal Medicine

## 2022-08-02 ENCOUNTER — Other Ambulatory Visit: Payer: Self-pay | Admitting: Internal Medicine

## 2022-08-02 MED ORDER — CLOPIDOGREL BISULFATE 75 MG PO TABS: 75.0000 mg | ORAL_TABLET | Freq: Every day | ORAL | 1 refills | Status: DC

## 2022-08-02 NOTE — Telephone Encounter (Signed)
Refilled Plavix until his office in November.

## 2022-09-03 ENCOUNTER — Telehealth: Payer: Self-pay | Admitting: Student

## 2022-09-03 NOTE — Telephone Encounter (Signed)
Greg [name] from [facility] states patient does not have insurance thru them. For CT scan prior authorization. Greg [name] phone number is [phone].

## 2022-09-04 ENCOUNTER — Other Ambulatory Visit: Payer: Medicaid Other

## 2022-12-15 NOTE — Progress Notes (Deleted)
Cardiology Office Note   Date:  12/15/2022   ID:  Greg [name], DOB Dec 03, 1970, MRN [id number]  PCP:  Ailene Ravel, MD  Cardiologist:   Dietrich Pates, MD    F/U of CAD    History of Present Illness: Greg [name] is a 52 y.o. male with a history of CAD (s/p CABG in 2018); HTN, HL, tobacco abuse , COPD   He wa recently discharged from [facility] (June 2021)  He underwent LHC which revealed 2/3 grafts patent.   SVG to PDA occluded.  LIMA to LAD patnet SVG to OM3 patent. (full report is below)   Since d/c from the hospital he notes rare CP    He does complain of bad headaches  ? If they are caused from one of his meds     The pt denies dizziness   Bruises easily .   I saw the pt in 2021   He was seen by APP since   No outpatient medications have been marked as taking for the 12/16/22 encounter (Appointment) with Pricilla Riffle, MD.     Allergies:   Patient has no known allergies.   Past Medical History:  Diagnosis Date   Anxiety    Asthma    CAD (coronary artery disease) of bypass graft    a. 11/2017 CABG x 3 (LIMA to LAD, SVG->dRCA, SVG->LCX); b. 07/2019 Cath/PCI: LM nl, LAD 50ost, 90p, 14m, LCX 90p/m, RCA 30p, 17m, 80d (2.0x15 Resolute DES), LIMA->mLAD ok, VG->OM3 40, VG->RPDA 100, EF 60%.   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/2017   fev1 33% predicted    Daily headache    ETOH abuse    GERD (gastroesophageal reflux disease)    Hyperlipidemia LDL goal <70    Hypertension    Myocardial infarction [facility])    Skin cancer    "cut off my eyelid and my neck" (11/12/2017) BCC   Tobacco abuse     Past Surgical History:  Procedure Laterality Date   COLONOSCOPY WITH PROPOFOL N/A 04/04/2021   Procedure: COLONOSCOPY WITH PROPOFOL;  Surgeon: Toledo, Boykin Nearing, MD;  Location: [facility] ENDOSCOPY;  Service: Gastroenterology;  Laterality: N/A;   CORONARY ARTERY BYPASS GRAFT N/A 11/21/2017   Procedure: CORONARY ARTERY BYPASS GRAFTING  (CABG) times 3 using left internal mammary artery and right greater saphenous vein harvested endscopically. LIMA to LAD, SVG to Distal Right, SVG to Circ.;  Surgeon: Delight Ovens, MD;  [facility];  Service: Open Heart Surgery;  Laterality: N/A;   CORONARY STENT INTERVENTION N/A 07/16/2019   Procedure: CORONARY STENT INTERVENTION;  Surgeon: Lennette Bihari, MD;  [facility];  Service: Cardiovascular;  Laterality: N/A;   ESOPHAGOGASTRODUODENOSCOPY N/A 04/04/2021   Procedure: ESOPHAGOGASTRODUODENOSCOPY (EGD);  Surgeon: Toledo, Boykin Nearing, MD;  Location: [facility] ENDOSCOPY;  Service: Gastroenterology;  Laterality: N/A;   LACERATION REPAIR     "had staples put in my head" (11/12/2017)   LEFT HEART CATH AND CORONARY ANGIOGRAPHY N/A 11/13/2017   Procedure: LEFT HEART CATH AND CORONARY ANGIOGRAPHY;  Surgeon: Lennette Bihari, MD;  [facility];  Service: Cardiovascular;  Laterality: N/A;   LEFT HEART CATH AND CORS/GRAFTS ANGIOGRAPHY N/A 07/16/2019   Procedure: LEFT HEART CATH AND CORS/GRAFTS ANGIOGRAPHY;  Surgeon: Lennette Bihari, MD;  [facility];  Service: Cardiovascular;  Laterality: N/A;   SKIN CANCER EXCISION     "cut off my eyelid and my  neck" (11/12/2017)   TEE WITHOUT CARDIOVERSION N/A 11/21/2017   Procedure: TRANSESOPHAGEAL ECHOCARDIOGRAM (TEE);  Surgeon: Delight Ovens, MD;  Location: [facility] OR;  Service: Open Heart Surgery;  Laterality: N/A;     Social History:  The patient  reports that he has been smoking cigarettes. He has a 87.5 pack-year smoking history. He has never used smokeless tobacco. He reports that he does not currently use alcohol. He reports that he does not currently use drugs after having used the following drugs: Marijuana.   Family History:  The patient's family history includes CAD in his maternal grandfather, maternal grandmother, mother, paternal grandfather, and paternal grandmother.    ROS:  Please see the history of  present illness. All other systems are reviewed and  Negative to the above problem except as noted.    PHYSICAL EXAM: VS:  There were no vitals taken for this visit.  GEN: Thin 52 yo, in no acute distress  HEENT: normal  Neck: no JVD, carotid bruits Cardiac: RRR; no murmurs,  No LE  edema  Respiratory:  clear to auscultation bilaterally, normal work of breathing GI: soft, nontender, nondistended, + BS  No hepatomegaly  MS: no deformity Moving all extremities   Skin: warm and dry, no rash Neuro:  Strength and sensation are intact Psych: euthymic mood, full affect   EKG:  EKG is not ordered today.  CARDIAC CATH   07/16/19    Ost LAD lesion is 50% stenosed. Mid LAD lesion is 80% stenosed. Dist RCA lesion is 80% stenosed. Prox RCA lesion is 30% stenosed. Prox RCA to Mid RCA lesion is 40% stenosed. Mid RCA lesion is 40% stenosed. Prox Cx to Mid Cx lesion is 90% stenosed. Origin lesion is 100% stenosed. Post intervention, there is a 0% residual stenosis. Prox LAD lesion is 90% stenosed. The left ventricular systolic function is normal. LV end diastolic pressure is moderately elevated. Dist Graft to Insertion lesion is 40% stenosed. A stent was successfully placed.   Severe multivessel native coronary obstructive disease with 50% proximal 90% proximal and 80% stenosis after the diagonal vessel with competitive filling via the LIMA graft; 90% proximal circumflex stenosis with competitive filling due to the vein graft; and RCA with 30%, 40% and 40% proximal to mid stenosis with 80% mid distal stenosis prior to the PDA and PLA takeoff for competitive filling.   Patent LIMA graft supplying the mid LAD.   Patent SVG supplying the circumflex marginal vessel but with evidence for 40% distal graft stenosis.   Occluded vein graft which had supplied the distal RCA.   Function with EF estimated 60%; LVEDP elevated at 24 mm.   Successful PCI to the RCA with ultimate insertion of a 2.0 x 15  mm Resolute Onyx stent postdilated to 2.20 mm with the 80% stenosis being reduced to 0%.   RECOMMENDATION: DAPT for minimum of 6 months but probably longer.  Smoking cessation is essential.  Medical therapy for concomitant CAD.  Aggressive lipid-lowering therapy with target LDL less than 70.    Lipid Panel    Component Value Date/Time   CHOL 221 (H) 10/04/2019 1442   TRIG 1,254 (HH) 10/04/2019 1442   HDL 34 (L) 10/04/2019 1442   CHOLHDL 6.5 (H) 10/04/2019 1442   CHOLHDL 2.7 11/14/2017 2304   VLDL 15 11/14/2017 2304   LDLCALC Comment (A) 10/04/2019 1442      Wt Readings from Last 3 Encounters:  11/28/21 141 lb 1.5 oz (64 kg)  11/21/21 141 lb  9.6 oz (64.2 kg)  10/23/21 140 lb 3.2 oz (63.6 kg)      ASSESSMENT AND PLAN:  1  CAD  Pt is s/p recent intervention  Note rare CP   Keep on ASA/ Brilinta   Will contiinue other meds  I will see in cilnic in Jan 2022  2   HTN  Stop amlodipine follow response to HA   Follow BP at home   Write in at end of week to [facility] for response     Will prob need another agent  I will be in touch with pt at end of week   Set up appt in HTN clinic for continued f/u of BP   3  HL   Check lipids / livertoday   Check:  CBC, BMET liver and lipid   F/U in 2 months    Current medicines are reviewed at length with the patient today.  The patient does not have concerns regarding medicines.  Signed, Dietrich Pates, MD  12/15/2022 10:03 PM    [facility] [address] [place], [place], [place]  [address] Phone: [phone]; Fax: [phone]

## 2022-12-16 ENCOUNTER — Ambulatory Visit: Payer: Medicaid Other | Admitting: Internal Medicine

## 2023-08-22 ENCOUNTER — Encounter: Payer: Self-pay | Admitting: Advanced Practice Midwife

## 2023-09-22 ENCOUNTER — Encounter: Payer: Self-pay | Admitting: Internal Medicine

## 2023-09-22 ENCOUNTER — Ambulatory Visit: Attending: Internal Medicine | Admitting: Internal Medicine

## 2023-09-22 VITALS — BP 139/84 | HR 85 | Ht 72.0 in | Wt 158.0 lb

## 2023-09-22 DIAGNOSIS — I10 Essential (primary) hypertension: Secondary | ICD-10-CM | POA: Diagnosis present

## 2023-09-22 DIAGNOSIS — I200 Unstable angina: Secondary | ICD-10-CM | POA: Insufficient documentation

## 2023-09-22 DIAGNOSIS — I214 Non-ST elevation (NSTEMI) myocardial infarction: Secondary | ICD-10-CM | POA: Insufficient documentation

## 2023-09-22 DIAGNOSIS — I249 Acute ischemic heart disease, unspecified: Secondary | ICD-10-CM | POA: Insufficient documentation

## 2023-09-22 DIAGNOSIS — I25708 Atherosclerosis of coronary artery bypass graft(s), unspecified, with other forms of angina pectoris: Secondary | ICD-10-CM | POA: Diagnosis present

## 2023-09-22 DIAGNOSIS — I2510 Atherosclerotic heart disease of native coronary artery without angina pectoris: Secondary | ICD-10-CM | POA: Insufficient documentation

## 2023-09-22 NOTE — Patient Instructions (Signed)
 Medication Instructions:  Your physician recommends that you continue on your current medications as directed. Please refer to the Current Medication list given to you today.  *If you need a refill on your cardiac medications before your next appointment, please call your pharmacy*  Lab Work: At your earliest convenience: fasting CBC, BMET, NMR  You may go to any of these [facility] locations: [facility] - [address] ([facility]) - [address] [phone] [address] (1st floor, next to pharmacy)   [place] - [address]    [place]  - [address]    [place] - [address] [employer]  - [address] [place] [address] ([facility])  [place]   - [address], [address] If you have labs (blood work) drawn today and your tests are completely normal, you will receive your results only by: [employer] (if you have MyChart) OR A paper copy in the mail If you have any lab test that is abnormal or we need to change your treatment, we will call you to review the results.  Follow-Up: At [facility], you and your health needs are our priority.  As part of our continuing mission to provide you with exceptional heart care, our providers are all part of one team.  This team includes your primary Cardiologist (physician) and Advanced Practice Providers or APPs (Physician Assistants and Nurse Practitioners) who all work together to provide you with the care you need, when you need it.  Your next appointment:   8-9 month(s)  Provider:   Vina Gull, MD  We recommend signing up for the patient portal called MyChart.  Sign up information is provided on this After Visit Summary.  MyChart is used to connect with patients for Virtual Visits (Telemedicine).  Patients are able to view lab/test results,  encounter notes, upcoming appointments, etc.  Non-urgent messages can be sent to your provider as well.    To learn more about what you can do with MyChart, go to [email or web address].

## 2023-09-22 NOTE — Progress Notes (Unsigned)
 Cardiology Office Note   Date:  09/22/2023   ID:  Greg [name], DOB 1970-09-19, MRN [id number]  PCP:  Stephanie Charlene CROME, MD  Cardiologist:   Vina Gull, MD    F/U of CAD    History of Present Illness: Greg [name] is a 53 y.o. male with a history of CAD (s/p CABG in 2018); HTN, HL, tobacco abuse , COPD   He wa recently discharged from [facility] (June 2021)  He underwent LHC which revealed 2/3 grafts patent.   SVG to PDA occluded.  LIMA to LAD patnet SVG to OM3 patent. (full report is below)  He underwent PTCA/DES to the RCA   Plan for DAPT at least 6 months     Since he was last in clinic the pt underwent a liver Bx (did not wait until 6 months out)    The pt denies CP   Breathing is stable  He gets SOB but he thinks it is from his lungs   Stable since summer   COntinues to drink about 2 beers per day    I saw the pt in Jan 2022   HE was seen by ONEIDA Fabry in Feb 2023    Current Meds  Medication Sig   acetaminophen  (TYLENOL ) 500 MG tablet Take 1,000 mg by mouth every 6 (six) hours as needed for moderate pain or headache.   albuterol  (VENTOLIN  HFA) 108 (90 Base) MCG/ACT inhaler Inhale 2 puffs into the lungs every 6 (six) hours as needed for wheezing or shortness of breath.   ALPRAZolam  (XANAX ) 1 MG tablet Take 1 mg by mouth 3 (three) times daily.    aspirin  81 MG tablet Take 1 tablet (81 mg total) by mouth daily.   atorvastatin  (LIPITOR ) 80 MG tablet Take 1 tablet (80 mg total) by mouth daily at 6 PM.   clopidogrel  (PLAVIX ) 75 MG tablet Take 1 tablet (75 mg total) by mouth daily.   ferrous sulfate 325 (65 FE) MG tablet Take 325 mg by mouth daily.   metoprolol  tartrate (LOPRESSOR ) 25 MG tablet Take 1.5 tablets (37.5 mg total) by mouth 2 (two) times daily. (Patient taking differently: Take 12.5 mg by mouth 2 (two) times daily.)   Multiple Vitamin (MULTIVITAMIN WITH MINERALS) TABS tablet Take 1 tablet by mouth daily.   OXYGEN Inhale 4 L into the lungs as needed (for breathing).    pantoprazole  (PROTONIX ) 40 MG tablet Take 1 tablet by mouth once daily   pregabalin (LYRICA) 200 MG capsule Take 200 mg by mouth 2 (two) times daily.   SPIRIVA HANDIHALER 18 MCG inhalation capsule Place 18 mcg into inhaler and inhale daily.     Allergies:   Patient has no known allergies.   Past Medical History:  Diagnosis Date   Anxiety    Asthma    CAD (coronary artery disease) of bypass graft    a. 11/2017 CABG x 3 (LIMA to LAD, SVG->dRCA, SVG->LCX); b. 07/2019 Cath/PCI: LM nl, LAD 50ost, 90p, 59m, LCX 90p/m, RCA 30p, 100m, 80d (2.0x15 Resolute DES), LIMA->mLAD ok, VG->OM3 40, VG->RPDA 100, EF 60%.   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/2017   fev1 33% predicted    Daily headache    ETOH abuse    GERD (gastroesophageal reflux disease)    Hyperlipidemia LDL goal <70    Hypertension    Myocardial infarction [facility])    Skin cancer    cut off my eyelid and my  neck (11/12/2017) BCC   Tobacco abuse     Past Surgical History:  Procedure Laterality Date   COLONOSCOPY WITH PROPOFOL  N/A 04/04/2021   Procedure: COLONOSCOPY WITH PROPOFOL ;  Surgeon: Toledo, Ladell POUR, MD;  Location: [facility] ENDOSCOPY;  Service: Gastroenterology;  Laterality: N/A;   CORONARY ARTERY BYPASS GRAFT N/A 11/21/2017   Procedure: CORONARY ARTERY BYPASS GRAFTING (CABG) times 3 using left internal mammary artery and right greater saphenous vein harvested endscopically. LIMA to LAD, SVG to Distal Right, SVG to Circ.;  Surgeon: Army Dallas NOVAK, MD;  Location[facility];  Service: Open Heart Surgery;  Laterality: N/A;   CORONARY STENT INTERVENTION N/A 07/16/2019   Procedure: CORONARY STENT INTERVENTION;  Surgeon: Burnard Debby LABOR, MD;  Location: [facility] INVASIVE CV LAB;  Service: Cardiovascular;  Laterality: N/A;   ESOPHAGOGASTRODUODENOSCOPY N/A 04/04/2021   Procedure: ESOPHAGOGASTRODUODENOSCOPY (EGD);  Surgeon: Toledo, Ladell POUR, MD;  Location: [facility] ENDOSCOPY;  Service: Gastroenterology;   Laterality: N/A;   LACERATION REPAIR     had staples put in my head (11/12/2017)   LEFT HEART CATH AND CORONARY ANGIOGRAPHY N/A 11/13/2017   Procedure: LEFT HEART CATH AND CORONARY ANGIOGRAPHY;  Surgeon: Burnard Debby LABOR, MD;  Location: [facility] INVASIVE CV LAB;  Service: Cardiovascular;  Laterality: N/A;   LEFT HEART CATH AND CORS/GRAFTS ANGIOGRAPHY N/A 07/16/2019   Procedure: LEFT HEART CATH AND CORS/GRAFTS ANGIOGRAPHY;  Surgeon: Burnard Debby LABOR, MD;  Location: [facility] INVASIVE CV LAB;  Service: Cardiovascular;  Laterality: N/A;   SKIN CANCER EXCISION     cut off my eyelid and my neck (11/12/2017)   TEE WITHOUT CARDIOVERSION N/A 11/21/2017   Procedure: TRANSESOPHAGEAL ECHOCARDIOGRAM (TEE);  Surgeon: Army Dallas NOVAK, MD;  Location: [facility] OR;  Service: Open Heart Surgery;  Laterality: N/A;     Social History:  The patient  reports that he has been smoking cigarettes. He has a 87.5 pack-year smoking history. He has never used smokeless tobacco. He reports that he does not currently use alcohol. He reports that he does not currently use drugs after having used the following drugs: Marijuana.   Family History:  The patient's family history includes CAD in his maternal grandfather, maternal grandmother, mother, paternal grandfather, and paternal grandmother.    ROS:  Please see the history of present illness. All other systems are reviewed and  Negative to the above problem except as noted.    PHYSICAL EXAM: VS:  BP 139/84   Pulse 85   Ht 6' (1.829 m)   Wt 158 lb (71.7 kg)   SpO2 94%   BMI 21.43 kg/m   GEN: Thin 53 yo, in no acute distress  HEENT: normal  Neck: no JVD, carotid bruits Cardiac: RRR; no murmurs,  No LE  edema  Respiratory:  clear to auscultation bilaterally,Moving air  GI: soft, nontender, nondistended, + BS  No hepatomegaly  MS: no deformity Moving all extremities   Skin: warm and dry, no rash Neuro:  Strength and sensation are intact Psych: euthymic mood, full affect   EKG:   EKG shows NSR 85 bpm    CARDIAC CATH   07/16/19    Ost LAD lesion is 50% stenosed. Mid LAD lesion is 80% stenosed. Dist RCA lesion is 80% stenosed. Prox RCA lesion is 30% stenosed. Prox RCA to Mid RCA lesion is 40% stenosed. Mid RCA lesion is 40% stenosed. Prox Cx to Mid Cx lesion is 90% stenosed. Origin lesion is 100% stenosed. Post intervention, there is a 0% residual stenosis. Prox LAD lesion is 90%  stenosed. The left ventricular systolic function is normal. LV end diastolic pressure is moderately elevated. Dist Graft to Insertion lesion is 40% stenosed. A stent was successfully placed.   Severe multivessel native coronary obstructive disease with 50% proximal 90% proximal and 80% stenosis after the diagonal vessel with competitive filling via the LIMA graft; 90% proximal circumflex stenosis with competitive filling due to the vein graft; and RCA with 30%, 40% and 40% proximal to mid stenosis with 80% mid distal stenosis prior to the PDA and PLA takeoff for competitive filling.   Patent LIMA graft supplying the mid LAD.   Patent SVG supplying the circumflex marginal vessel but with evidence for 40% distal graft stenosis.   Occluded vein graft which had supplied the distal RCA.   Function with EF estimated 60%; LVEDP elevated at 24 mm.   Successful PCI to the RCA with ultimate insertion of a 2.0 x 15 mm Resolute Onyx stent postdilated to 2.20 mm with the 80% stenosis being reduced to 0%.   RECOMMENDATION: DAPT for minimum of 6 months but probably longer.  Smoking cessation is essential.  Medical therapy for concomitant CAD.  Aggressive lipid-lowering therapy with target LDL less than 70.    Lipid Panel    Component Value Date/Time   CHOL 221 (H) 10/04/2019 1442   TRIG 1,254 (HH) 10/04/2019 1442   HDL 34 (L) 10/04/2019 1442   CHOLHDL 6.5 (H) 10/04/2019 1442   CHOLHDL 2.7 11/14/2017 2304   VLDL 15 11/14/2017 2304   LDLCALC Comment (A) 10/04/2019 1442      Wt Readings  from Last 3 Encounters:  09/22/23 158 lb (71.7 kg)  11/28/21 141 lb 1.5 oz (64 kg)  11/21/21 141 lb 9.6 oz (64.2 kg)      ASSESSMENT AND PLAN:  1  CAD   Intervention in June 2021  I am not convinced of active symptoms   Continue DAPT  2   HTN  BP is OK  Keep on current regimen  3  HL  Last LDL 74  HDL 34   Trig 1254  (AUg   2021)  Need to repeat along with LFTs     4   Abnormal LFTs   Pt has gone on to have liver Bx   Focal bridging fibrosis; minimally active steatohepatitis.     Continues to drink some   5  EtOH  COunselled on cesssation.       Current medicines are reviewed at length with the patient today.  The patient does not have concerns regarding medicines.  Signed, Vina Gull, MD  09/22/2023 12:07 PM    [facility] [address] [place], [place], [place]  [address] Phone: [phone]; Fax: [phone]

## 2023-12-09 ENCOUNTER — Emergency Department (HOSPITAL_COMMUNITY)
Admission: EM | Admit: 2023-12-09 | Discharge: 2023-12-09 | Disposition: A | Discharge status: Home / Self Care | Attending: Emergency Medicine | Admitting: Emergency Medicine

## 2023-12-09 ENCOUNTER — Other Ambulatory Visit: Payer: Self-pay

## 2023-12-09 ENCOUNTER — Encounter (HOSPITAL_COMMUNITY): Payer: Self-pay

## 2023-12-09 DIAGNOSIS — F17210 Nicotine dependence, cigarettes, uncomplicated: Secondary | ICD-10-CM | POA: Diagnosis not present

## 2023-12-09 DIAGNOSIS — T2020XA Burn of second degree of head, face, and neck, unspecified site, initial encounter: Secondary | ICD-10-CM | POA: Insufficient documentation

## 2023-12-09 DIAGNOSIS — X04XXXA Exposure to ignition of highly flammable material, initial encounter: Secondary | ICD-10-CM | POA: Diagnosis not present

## 2023-12-09 DIAGNOSIS — Z23 Encounter for immunization: Secondary | ICD-10-CM | POA: Diagnosis not present

## 2023-12-09 DIAGNOSIS — T2004XA Burn of unspecified degree of nose (septum), initial encounter: Secondary | ICD-10-CM | POA: Diagnosis present

## 2023-12-09 DIAGNOSIS — Z7982 Long term (current) use of aspirin: Secondary | ICD-10-CM | POA: Diagnosis not present

## 2023-12-09 DIAGNOSIS — T2024XA Burn of second degree of nose (septum), initial encounter: Secondary | ICD-10-CM | POA: Insufficient documentation

## 2023-12-09 DIAGNOSIS — T22212A Burn of second degree of left forearm, initial encounter: Secondary | ICD-10-CM | POA: Insufficient documentation

## 2023-12-09 DIAGNOSIS — Z7902 Long term (current) use of antithrombotics/antiplatelets: Secondary | ICD-10-CM | POA: Diagnosis not present

## 2023-12-09 DIAGNOSIS — T22211A Burn of second degree of right forearm, initial encounter: Secondary | ICD-10-CM | POA: Diagnosis not present

## 2023-12-09 MED ORDER — TETANUS-DIPHTHERIA TOXOIDS TD 5-2 LF/0.5ML IM SUSP
0.5000 mL | Freq: Once | INTRAMUSCULAR | Status: AC
  Administered 2023-12-09: 0.5 mL via INTRAMUSCULAR
  Filled 2023-12-09: qty 0.5

## 2023-12-09 MED ORDER — MORPHINE SULFATE (PF) 4 MG/ML IV SOLN
8.0000 mg | Freq: Once | INTRAVENOUS | Status: AC
Start: 2023-12-09 — End: 2023-12-09
  Administered 2023-12-09: 8 mg via INTRAMUSCULAR
  Filled 2023-12-09: qty 2

## 2023-12-09 MED ORDER — TRIPLE ANTIBIOTIC 3.5-400-5000 EX OINT
TOPICAL_OINTMENT | CUTANEOUS | Status: AC | PRN
  Filled 2023-12-09: qty 1

## 2023-12-09 MED ORDER — OXYCODONE-ACETAMINOPHEN 5-325 MG PO TABS: 1.0000 | ORAL_TABLET | Freq: Four times a day (QID) | ORAL | 0 refills | Status: DC | PRN
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
## 2023-12-10 NOTE — Telephone Encounter (Signed)
 Wife is calling to get her husband rescheduled for his 11/18 appointment, she wants to come in on 11/19 there is nothing available for that day. She is asking that someone call her to work this out.
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
## 2024-02-05 DEATH — deceased
